# Patient Record
Sex: Male | Born: 1940 | Race: White | Hispanic: No | Marital: Married | Smoking: Former smoker
Health system: Southern US, Community
[De-identification: ages and names within clinical notes are randomized; demographics above are authoritative.]

## PROBLEM LIST (undated history)

## (undated) DIAGNOSIS — K219 Gastro-esophageal reflux disease without esophagitis: Secondary | ICD-10-CM

## (undated) DIAGNOSIS — I10 Essential (primary) hypertension: Secondary | ICD-10-CM

## (undated) DIAGNOSIS — I2510 Atherosclerotic heart disease of native coronary artery without angina pectoris: Secondary | ICD-10-CM

## (undated) DIAGNOSIS — E785 Hyperlipidemia, unspecified: Secondary | ICD-10-CM

## (undated) DIAGNOSIS — R9439 Abnormal result of other cardiovascular function study: Secondary | ICD-10-CM

## (undated) DIAGNOSIS — C649 Malignant neoplasm of unspecified kidney, except renal pelvis: Secondary | ICD-10-CM

## (undated) HISTORY — PX: TONSILLECTOMY: SUR1361

## (undated) HISTORY — DX: Essential (primary) hypertension: I10

## (undated) HISTORY — PX: KNEE SURGERY: SHX244

## (undated) HISTORY — PX: CORONARY ANGIOPLASTY WITH STENT PLACEMENT: SHX49

## (undated) HISTORY — DX: Atherosclerotic heart disease of native coronary artery without angina pectoris: I25.10

## (undated) HISTORY — PX: LUMBAR LAMINECTOMY: SHX95

## (undated) HISTORY — PX: APPENDECTOMY: SHX54

## (undated) HISTORY — DX: Malignant neoplasm of unspecified kidney, except renal pelvis: C64.9

## (undated) HISTORY — PX: CERVICAL LAMINECTOMY: SHX94

## (undated) HISTORY — DX: Hyperlipidemia, unspecified: E78.5

## (undated) HISTORY — DX: Gastro-esophageal reflux disease without esophagitis: K21.9

---

## 1998-02-21 ENCOUNTER — Other Ambulatory Visit: Admission: RE | Admit: 1998-02-21 | Discharge: 1998-02-21 | Payer: Self-pay | Admitting: Cardiology

## 1998-02-22 ENCOUNTER — Inpatient Hospital Stay (HOSPITAL_COMMUNITY): Admission: AD | Admit: 1998-02-22 | Discharge: 1998-02-24 | Payer: Self-pay | Admitting: Cardiology

## 1998-03-07 ENCOUNTER — Inpatient Hospital Stay (HOSPITAL_COMMUNITY): Admission: EM | Admit: 1998-03-07 | Discharge: 1998-03-09 | Payer: Self-pay | Admitting: *Deleted

## 1998-09-13 ENCOUNTER — Ambulatory Visit (HOSPITAL_COMMUNITY): Admission: RE | Admit: 1998-09-13 | Discharge: 1998-09-14 | Payer: Self-pay | Admitting: Cardiology

## 1998-09-30 HISTORY — PX: CORONARY ARTERY BYPASS GRAFT: SHX141

## 1999-01-07 ENCOUNTER — Inpatient Hospital Stay (HOSPITAL_COMMUNITY): Admission: EM | Admit: 1999-01-07 | Discharge: 1999-01-16 | Payer: Self-pay | Admitting: Emergency Medicine

## 1999-01-07 ENCOUNTER — Encounter: Payer: Self-pay | Admitting: Emergency Medicine

## 1999-01-10 ENCOUNTER — Encounter: Payer: Self-pay | Admitting: Internal Medicine

## 1999-01-11 ENCOUNTER — Encounter: Payer: Self-pay | Admitting: Thoracic Surgery (Cardiothoracic Vascular Surgery)

## 1999-01-12 ENCOUNTER — Encounter: Payer: Self-pay | Admitting: Thoracic Surgery (Cardiothoracic Vascular Surgery)

## 1999-01-13 ENCOUNTER — Encounter: Payer: Self-pay | Admitting: Cardiology

## 1999-01-14 ENCOUNTER — Encounter: Payer: Self-pay | Admitting: Thoracic Surgery (Cardiothoracic Vascular Surgery)

## 1999-02-06 ENCOUNTER — Encounter (HOSPITAL_COMMUNITY): Admission: RE | Admit: 1999-02-06 | Discharge: 1999-05-07 | Payer: Self-pay | Admitting: Cardiology

## 1999-05-08 ENCOUNTER — Encounter (HOSPITAL_COMMUNITY): Admission: RE | Admit: 1999-05-08 | Discharge: 1999-08-06 | Payer: Self-pay | Admitting: Cardiology

## 1999-12-19 ENCOUNTER — Inpatient Hospital Stay (HOSPITAL_COMMUNITY): Admission: EM | Admit: 1999-12-19 | Discharge: 1999-12-20 | Payer: Self-pay | Admitting: Emergency Medicine

## 2000-01-15 ENCOUNTER — Ambulatory Visit: Admission: RE | Admit: 2000-01-15 | Discharge: 2000-01-15 | Payer: Self-pay | Admitting: Cardiology

## 2001-12-01 ENCOUNTER — Encounter: Admission: RE | Admit: 2001-12-01 | Discharge: 2001-12-01 | Payer: Self-pay | Admitting: Neurosurgery

## 2001-12-01 ENCOUNTER — Encounter: Payer: Self-pay | Admitting: Neurosurgery

## 2001-12-15 ENCOUNTER — Encounter: Admission: RE | Admit: 2001-12-15 | Discharge: 2001-12-15 | Payer: Self-pay | Admitting: Neurosurgery

## 2001-12-15 ENCOUNTER — Encounter: Payer: Self-pay | Admitting: Neurosurgery

## 2002-07-19 ENCOUNTER — Inpatient Hospital Stay (HOSPITAL_COMMUNITY): Admission: EM | Admit: 2002-07-19 | Discharge: 2002-07-21 | Payer: Self-pay | Admitting: Emergency Medicine

## 2002-07-20 ENCOUNTER — Encounter: Payer: Self-pay | Admitting: Cardiology

## 2002-08-04 ENCOUNTER — Ambulatory Visit (HOSPITAL_COMMUNITY): Admission: RE | Admit: 2002-08-04 | Discharge: 2002-08-04 | Payer: Self-pay | Admitting: Internal Medicine

## 2002-09-30 DIAGNOSIS — C649 Malignant neoplasm of unspecified kidney, except renal pelvis: Secondary | ICD-10-CM

## 2002-09-30 HISTORY — PX: NEPHRECTOMY: SHX65

## 2002-09-30 HISTORY — DX: Malignant neoplasm of unspecified kidney, except renal pelvis: C64.9

## 2003-05-26 ENCOUNTER — Encounter: Payer: Self-pay | Admitting: Diagnostic Radiology

## 2003-05-26 ENCOUNTER — Encounter: Payer: Self-pay | Admitting: Orthopedic Surgery

## 2003-05-26 ENCOUNTER — Encounter: Admission: RE | Admit: 2003-05-26 | Discharge: 2003-05-26 | Payer: Self-pay | Admitting: Orthopedic Surgery

## 2003-06-13 ENCOUNTER — Encounter: Payer: Self-pay | Admitting: Orthopedic Surgery

## 2003-06-13 ENCOUNTER — Encounter: Admission: RE | Admit: 2003-06-13 | Discharge: 2003-06-13 | Payer: Self-pay | Admitting: Orthopedic Surgery

## 2003-06-27 ENCOUNTER — Encounter: Admission: RE | Admit: 2003-06-27 | Discharge: 2003-06-27 | Payer: Self-pay | Admitting: Orthopedic Surgery

## 2003-06-27 ENCOUNTER — Encounter: Payer: Self-pay | Admitting: Orthopedic Surgery

## 2003-08-12 ENCOUNTER — Ambulatory Visit (HOSPITAL_COMMUNITY): Admission: RE | Admit: 2003-08-12 | Discharge: 2003-08-12 | Payer: Self-pay | Admitting: Neurosurgery

## 2003-08-18 ENCOUNTER — Encounter: Admission: RE | Admit: 2003-08-18 | Discharge: 2003-08-18 | Payer: Self-pay | Admitting: Neurosurgery

## 2003-09-06 ENCOUNTER — Encounter (INDEPENDENT_AMBULATORY_CARE_PROVIDER_SITE_OTHER): Payer: Self-pay

## 2003-09-06 ENCOUNTER — Inpatient Hospital Stay (HOSPITAL_COMMUNITY): Admission: RE | Admit: 2003-09-06 | Discharge: 2003-09-09 | Payer: Self-pay | Admitting: Urology

## 2003-10-12 ENCOUNTER — Ambulatory Visit (HOSPITAL_COMMUNITY): Admission: RE | Admit: 2003-10-12 | Discharge: 2003-10-13 | Payer: Self-pay | Admitting: Neurosurgery

## 2003-12-16 ENCOUNTER — Ambulatory Visit (HOSPITAL_COMMUNITY): Admission: RE | Admit: 2003-12-16 | Discharge: 2003-12-16 | Payer: Self-pay | Admitting: Urology

## 2004-09-04 ENCOUNTER — Ambulatory Visit: Payer: Self-pay | Admitting: Cardiology

## 2004-09-11 ENCOUNTER — Ambulatory Visit: Payer: Self-pay | Admitting: Cardiology

## 2004-09-30 HISTORY — PX: OTHER SURGICAL HISTORY: SHX169

## 2005-09-20 ENCOUNTER — Ambulatory Visit: Payer: Self-pay | Admitting: Cardiology

## 2005-10-03 ENCOUNTER — Ambulatory Visit: Payer: Self-pay | Admitting: Cardiology

## 2006-01-01 ENCOUNTER — Ambulatory Visit: Payer: Self-pay | Admitting: Internal Medicine

## 2006-01-16 ENCOUNTER — Ambulatory Visit: Payer: Self-pay | Admitting: Internal Medicine

## 2006-01-16 ENCOUNTER — Encounter (INDEPENDENT_AMBULATORY_CARE_PROVIDER_SITE_OTHER): Payer: Self-pay | Admitting: *Deleted

## 2006-05-26 ENCOUNTER — Ambulatory Visit (HOSPITAL_COMMUNITY): Admission: RE | Admit: 2006-05-26 | Discharge: 2006-05-26 | Payer: Self-pay | Admitting: Urology

## 2006-10-02 ENCOUNTER — Ambulatory Visit: Payer: Self-pay | Admitting: Cardiology

## 2006-10-13 ENCOUNTER — Ambulatory Visit: Payer: Self-pay | Admitting: Cardiology

## 2006-10-13 LAB — CONVERTED CEMR LAB: TSH: 1.71 microintl units/mL (ref 0.35–5.50)

## 2006-10-20 ENCOUNTER — Ambulatory Visit: Payer: Self-pay

## 2006-11-06 ENCOUNTER — Ambulatory Visit (HOSPITAL_COMMUNITY): Admission: RE | Admit: 2006-11-06 | Discharge: 2006-11-06 | Payer: Self-pay | Admitting: Urology

## 2007-02-24 ENCOUNTER — Ambulatory Visit: Payer: Self-pay | Admitting: Internal Medicine

## 2007-05-12 ENCOUNTER — Ambulatory Visit (HOSPITAL_COMMUNITY): Admission: RE | Admit: 2007-05-12 | Discharge: 2007-05-12 | Payer: Self-pay | Admitting: Urology

## 2007-07-28 ENCOUNTER — Encounter: Admission: RE | Admit: 2007-07-28 | Discharge: 2007-07-28 | Payer: Self-pay | Admitting: Orthopedic Surgery

## 2007-08-11 ENCOUNTER — Encounter: Admission: RE | Admit: 2007-08-11 | Discharge: 2007-08-11 | Payer: Self-pay | Admitting: Orthopedic Surgery

## 2007-08-26 ENCOUNTER — Encounter: Admission: RE | Admit: 2007-08-26 | Discharge: 2007-08-26 | Payer: Self-pay | Admitting: Orthopedic Surgery

## 2007-09-01 ENCOUNTER — Encounter: Payer: Self-pay | Admitting: Internal Medicine

## 2007-10-13 ENCOUNTER — Ambulatory Visit: Payer: Self-pay | Admitting: Cardiology

## 2007-10-13 LAB — CONVERTED CEMR LAB
ALT: 24 units/L (ref 0–53)
AST: 21 units/L (ref 0–37)
Albumin: 3.6 g/dL (ref 3.5–5.2)
Alkaline Phosphatase: 66 units/L (ref 39–117)
BUN: 11 mg/dL (ref 6–23)
Basophils Absolute: 0 10*3/uL (ref 0.0–0.1)
Basophils Relative: 0.5 % (ref 0.0–1.0)
Bilirubin, Direct: 0.1 mg/dL (ref 0.0–0.3)
CO2: 31 meq/L (ref 19–32)
Calcium: 9.4 mg/dL (ref 8.4–10.5)
Chloride: 106 meq/L (ref 96–112)
Cholesterol: 166 mg/dL (ref 0–200)
Creatinine, Ser: 1.3 mg/dL (ref 0.4–1.5)
Eosinophils Absolute: 0.1 10*3/uL (ref 0.0–0.6)
Eosinophils Relative: 1 % (ref 0.0–5.0)
GFR calc Af Amer: 71 mL/min
GFR calc non Af Amer: 59 mL/min
Glucose, Bld: 108 mg/dL — ABNORMAL HIGH (ref 70–99)
HCT: 41.8 % (ref 39.0–52.0)
HDL: 49.2 mg/dL (ref >39.0)
Hemoglobin: 14.4 g/dL (ref 13.0–17.0)
LDL Cholesterol: 109 mg/dL — ABNORMAL HIGH (ref 0–99)
Lymphocytes Relative: 24.9 % (ref 12.0–46.0)
MCHC: 34.4 g/dL (ref 30.0–36.0)
MCV: 95.7 fL (ref 78.0–100.0)
Monocytes Absolute: 0.7 10*3/uL (ref 0.2–0.7)
Monocytes Relative: 9.3 % (ref 3.0–11.0)
Neutro Abs: 5.1 10*3/uL (ref 1.4–7.7)
Neutrophils Relative %: 64.3 % (ref 43.0–77.0)
Platelets: 273 10*3/uL (ref 150–400)
Potassium: 4.3 meq/L (ref 3.5–5.1)
RBC: 4.37 M/uL (ref 4.22–5.81)
RDW: 12.2 % (ref 11.5–14.6)
Sodium: 143 meq/L (ref 135–145)
TSH: 1.25 microintl units/mL (ref 0.35–5.50)
Total Bilirubin: 0.9 mg/dL (ref 0.3–1.2)
Total CHOL/HDL Ratio: 3.4
Total Protein: 6.9 g/dL (ref 6.0–8.3)
Triglycerides: 41 mg/dL (ref 0–149)
VLDL: 8 mg/dL (ref 0–40)
WBC: 7.8 10*3/uL (ref 4.5–10.5)

## 2007-10-16 ENCOUNTER — Ambulatory Visit: Payer: Self-pay | Admitting: Cardiology

## 2008-01-05 ENCOUNTER — Ambulatory Visit (HOSPITAL_COMMUNITY): Admission: RE | Admit: 2008-01-05 | Discharge: 2008-01-05 | Payer: Self-pay | Admitting: Urology

## 2008-10-04 ENCOUNTER — Ambulatory Visit: Payer: Self-pay | Admitting: Cardiology

## 2008-10-04 LAB — CONVERTED CEMR LAB
ALT: 23 units/L (ref 0–53)
AST: 22 units/L (ref 0–37)
Albumin: 3.6 g/dL (ref 3.5–5.2)
Alkaline Phosphatase: 62 units/L (ref 39–117)
BUN: 19 mg/dL (ref 6–23)
Basophils Absolute: 0 10*3/uL (ref 0.0–0.1)
Basophils Relative: 0.3 % (ref 0.0–3.0)
Bilirubin, Direct: 0.1 mg/dL (ref 0.0–0.3)
CO2: 31 meq/L (ref 19–32)
Calcium: 9.2 mg/dL (ref 8.4–10.5)
Chloride: 107 meq/L (ref 96–112)
Cholesterol: 148 mg/dL (ref 0–200)
Creatinine, Ser: 1.2 mg/dL (ref 0.4–1.5)
Eosinophils Absolute: 0.1 10*3/uL (ref 0.0–0.7)
Eosinophils Relative: 1.2 % (ref 0.0–5.0)
GFR calc Af Amer: 78 mL/min
GFR calc non Af Amer: 65 mL/min
Glucose, Bld: 119 mg/dL — ABNORMAL HIGH (ref 70–99)
HCT: 42.5 % (ref 39.0–52.0)
HDL: 42.4 mg/dL (ref >39.0)
Hemoglobin: 14.7 g/dL (ref 13.0–17.0)
LDL Cholesterol: 94 mg/dL (ref 0–99)
Lymphocytes Relative: 23.3 % (ref 12.0–46.0)
MCHC: 34.6 g/dL (ref 30.0–36.0)
MCV: 95.8 fL (ref 78.0–100.0)
Monocytes Absolute: 0.6 10*3/uL (ref 0.1–1.0)
Monocytes Relative: 8.7 % (ref 3.0–12.0)
Neutro Abs: 4.5 10*3/uL (ref 1.4–7.7)
Neutrophils Relative %: 66.5 % (ref 43.0–77.0)
Platelets: 166 10*3/uL (ref 150–400)
Potassium: 4.9 meq/L (ref 3.5–5.1)
RBC: 4.43 M/uL (ref 4.22–5.81)
RDW: 12.1 % (ref 11.5–14.6)
Sodium: 142 meq/L (ref 135–145)
Total Bilirubin: 0.7 mg/dL (ref 0.3–1.2)
Total CHOL/HDL Ratio: 3.5
Total Protein: 6.6 g/dL (ref 6.0–8.3)
Triglycerides: 57 mg/dL (ref 0–149)
VLDL: 11 mg/dL (ref 0–40)
WBC: 6.8 10*3/uL (ref 4.5–10.5)

## 2008-10-07 ENCOUNTER — Ambulatory Visit: Payer: Self-pay | Admitting: Cardiology

## 2008-10-07 LAB — CONVERTED CEMR LAB: Hgb A1c MFr Bld: 6 % (ref 4.6–6.0)

## 2009-02-02 ENCOUNTER — Ambulatory Visit (HOSPITAL_COMMUNITY): Admission: RE | Admit: 2009-02-02 | Discharge: 2009-02-02 | Payer: Self-pay | Admitting: Urology

## 2009-07-06 ENCOUNTER — Encounter: Payer: Self-pay | Admitting: Internal Medicine

## 2009-08-22 ENCOUNTER — Telehealth: Payer: Self-pay | Admitting: Cardiology

## 2009-10-11 ENCOUNTER — Ambulatory Visit: Payer: Self-pay | Admitting: Internal Medicine

## 2009-10-11 DIAGNOSIS — I10 Essential (primary) hypertension: Secondary | ICD-10-CM

## 2009-10-11 DIAGNOSIS — K219 Gastro-esophageal reflux disease without esophagitis: Secondary | ICD-10-CM

## 2009-10-11 DIAGNOSIS — I2510 Atherosclerotic heart disease of native coronary artery without angina pectoris: Secondary | ICD-10-CM | POA: Insufficient documentation

## 2009-10-11 DIAGNOSIS — C649 Malignant neoplasm of unspecified kidney, except renal pelvis: Secondary | ICD-10-CM | POA: Insufficient documentation

## 2009-10-11 DIAGNOSIS — R519 Headache, unspecified: Secondary | ICD-10-CM | POA: Insufficient documentation

## 2009-10-11 DIAGNOSIS — E785 Hyperlipidemia, unspecified: Secondary | ICD-10-CM

## 2009-10-11 DIAGNOSIS — R51 Headache: Secondary | ICD-10-CM

## 2009-10-16 ENCOUNTER — Ambulatory Visit: Payer: Self-pay | Admitting: Cardiology

## 2009-10-25 ENCOUNTER — Ambulatory Visit: Payer: Self-pay | Admitting: Cardiology

## 2009-10-25 ENCOUNTER — Encounter: Payer: Self-pay | Admitting: Cardiology

## 2009-10-26 LAB — CONVERTED CEMR LAB
ALT: 23 units/L (ref 0–53)
AST: 22 units/L (ref 0–37)
Albumin: 3.9 g/dL (ref 3.5–5.2)
Alkaline Phosphatase: 65 units/L (ref 39–117)
Basophils Absolute: 0.1 10*3/uL (ref 0.0–0.1)
Basophils Relative: 0.9 % (ref 0.0–3.0)
Bilirubin, Direct: 0.1 mg/dL (ref 0.0–0.3)
Cholesterol: 164 mg/dL (ref 0–200)
Eosinophils Absolute: 0.1 10*3/uL (ref 0.0–0.7)
Eosinophils Relative: 1.2 % (ref 0.0–5.0)
HCT: 43.2 % (ref 39.0–52.0)
HDL: 47.9 mg/dL (ref >39.00)
Hemoglobin: 14.3 g/dL (ref 13.0–17.0)
Hgb A1c MFr Bld: 6.1 % (ref 4.6–6.5)
LDL Cholesterol: 101 mg/dL — ABNORMAL HIGH (ref 0–99)
Lymphocytes Relative: 26.5 % (ref 12.0–46.0)
Lymphs Abs: 2 10*3/uL (ref 0.7–4.0)
MCHC: 33.1 g/dL (ref 30.0–36.0)
MCV: 97.2 fL (ref 78.0–100.0)
Monocytes Absolute: 0.7 10*3/uL (ref 0.1–1.0)
Monocytes Relative: 9 % (ref 3.0–12.0)
Neutro Abs: 4.7 10*3/uL (ref 1.4–7.7)
Neutrophils Relative %: 62.4 % (ref 43.0–77.0)
Platelets: 196 10*3/uL (ref 150.0–400.0)
RBC: 4.44 M/uL (ref 4.22–5.81)
RDW: 12.4 % (ref 11.5–14.6)
TSH: 1.32 microintl units/mL (ref 0.35–5.50)
Total Bilirubin: 0.6 mg/dL (ref 0.3–1.2)
Total CHOL/HDL Ratio: 3
Total Protein: 7.3 g/dL (ref 6.0–8.3)
Triglycerides: 75 mg/dL (ref 0.0–149.0)
VLDL: 15 mg/dL (ref 0.0–40.0)
WBC: 7.6 10*3/uL (ref 4.5–10.5)

## 2009-10-27 ENCOUNTER — Encounter: Payer: Self-pay | Admitting: Cardiology

## 2010-03-12 ENCOUNTER — Telehealth (INDEPENDENT_AMBULATORY_CARE_PROVIDER_SITE_OTHER): Payer: Self-pay | Admitting: *Deleted

## 2010-06-24 ENCOUNTER — Encounter: Payer: Self-pay | Admitting: Internal Medicine

## 2010-07-11 ENCOUNTER — Telehealth: Payer: Self-pay | Admitting: Cardiology

## 2010-10-02 ENCOUNTER — Ambulatory Visit
Admission: RE | Admit: 2010-10-02 | Discharge: 2010-10-02 | Payer: Self-pay | Source: Home / Self Care | Attending: Cardiovascular Disease | Admitting: Cardiovascular Disease

## 2010-10-03 ENCOUNTER — Encounter (INDEPENDENT_AMBULATORY_CARE_PROVIDER_SITE_OTHER): Payer: Self-pay | Admitting: *Deleted

## 2010-10-03 LAB — CONVERTED CEMR LAB
ALT: 19 units/L (ref 0–53)
AST: 20 units/L (ref 0–37)
Albumin: 3.5 g/dL (ref 3.5–5.2)
Alkaline Phosphatase: 70 units/L (ref 39–117)
BUN: 16 mg/dL (ref 6–23)
Basophils Absolute: 0 10*3/uL (ref 0.0–0.1)
Basophils Relative: 0.4 % (ref 0.0–3.0)
Bilirubin, Direct: 0.1 mg/dL (ref 0.0–0.3)
CO2: 28 meq/L (ref 19–32)
Calcium: 9.3 mg/dL (ref 8.4–10.5)
Chloride: 106 meq/L (ref 96–112)
Cholesterol: 154 mg/dL (ref 0–200)
Creatinine, Ser: 1.1 mg/dL (ref 0.4–1.5)
Eosinophils Absolute: 0.1 10*3/uL (ref 0.0–0.7)
Eosinophils Relative: 0.7 % (ref 0.0–5.0)
GFR calc non Af Amer: 71.52 mL/min (ref >60.00)
Glucose, Bld: 98 mg/dL (ref 70–99)
HCT: 41.4 % (ref 39.0–52.0)
HDL: 43.3 mg/dL (ref >39.00)
Hemoglobin: 14.1 g/dL (ref 13.0–17.0)
LDL Cholesterol: 101 mg/dL — ABNORMAL HIGH (ref 0–99)
Lymphocytes Relative: 21.2 % (ref 12.0–46.0)
Lymphs Abs: 1.9 10*3/uL (ref 0.7–4.0)
MCHC: 34.1 g/dL (ref 30.0–36.0)
MCV: 95.2 fL (ref 78.0–100.0)
Monocytes Absolute: 0.7 10*3/uL (ref 0.1–1.0)
Monocytes Relative: 7.6 % (ref 3.0–12.0)
Neutro Abs: 6.3 10*3/uL (ref 1.4–7.7)
Neutrophils Relative %: 70.1 % (ref 43.0–77.0)
Platelets: 207 10*3/uL (ref 150.0–400.0)
Potassium: 4.4 meq/L (ref 3.5–5.1)
RBC: 4.35 M/uL (ref 4.22–5.81)
RDW: 13.2 % (ref 11.5–14.6)
Sodium: 142 meq/L (ref 135–145)
TSH: 1.54 microintl units/mL (ref 0.35–5.50)
Total Bilirubin: 0.7 mg/dL (ref 0.3–1.2)
Total CHOL/HDL Ratio: 4
Total Protein: 7 g/dL (ref 6.0–8.3)
Triglycerides: 51 mg/dL (ref 0.0–149.0)
VLDL: 10.2 mg/dL (ref 0.0–40.0)
WBC: 9 10*3/uL (ref 4.5–10.5)

## 2010-10-09 ENCOUNTER — Ambulatory Visit
Admission: RE | Admit: 2010-10-09 | Discharge: 2010-10-09 | Payer: Self-pay | Source: Home / Self Care | Attending: Cardiovascular Disease | Admitting: Cardiovascular Disease

## 2010-11-01 NOTE — Medication Information (Signed)
Summary: Prescription Refill  Prescription Refill   Imported By: Roderic Ovens 11/21/2009 12:17:38  _____________________________________________________________________  External Attachment:    Type:   Image     Comment:   External Document

## 2010-11-01 NOTE — Medication Information (Signed)
Summary: [facility] Prescription Order Form  [facility] Prescription Order Form   Imported By: Roderic Ovens 11/24/2009 09:20:17  _____________________________________________________________________  External Attachment:    Type:   Image     Comment:   External Document

## 2010-11-01 NOTE — Miscellaneous (Signed)
Summary: [facility]  [facility]   Imported By: Lanelle Bal 07/02/2010 12:26:08  _____________________________________________________________________  External Attachment:    Type:   Image     Comment:   External Document

## 2010-11-01 NOTE — Letter (Signed)
Summary: Custom - Lipid  [name], Main Office  [address]   [place], [place] [address]   Phone: [phone]  Fax: [phone]     October 03, 2010 MRN: [id number]   Curtis Galloway [address] [place], [place]  [address]   Dear [name],  We have reviewed your cholesterol results.  They are as follows:     Total Cholesterol:    154 (Desirable: less than 200)       HDL  Cholesterol:     43.30  (Desirable: greater than 40 for men and 50 for women)       LDL Cholesterol:       101  (Desirable: less than 100 for low risk and less than 70 for moderate to high risk)       Triglycerides:       51.0  (Desirable: less than 150)  Our recommendations include:These numbers look good. Continue on the same medicine. Sodium, potassium, blood count, kidney, thyroid and Liver function are normal. Take care, Dr. Leanora Cover.`    Call our office at the number listed above if you have any questions.  Lowering your LDL cholesterol is important, but it is only one of a large number of "risk factors" that may indicate that you are at risk for heart disease, stroke or other complications of hardening of the arteries.  Other risk factors include:   A.  Cigarette Smoking* B.  High Blood Pressure* C.  Obesity* D.   Low HDL Cholesterol (see yours above)* E.   Diabetes Mellitus (higher risk if your is uncontrolled) F.  Family history of premature heart disease G.  Previous history of stroke or cardiovascular disease    *These are risk factors YOU HAVE CONTROL OVER.  For more information, visit .  There is now evidence that lowering the TOTAL CHOLESTEROL AND LDL CHOLESTEROL can reduce the risk of heart disease.  The American Heart Association recommends the following guidelines for the treatment of elevated cholesterol:  1.  If there is now current heart disease and less than two risk factors, TOTAL CHOLESTEROL should be less than 200 and LDL CHOLESTEROL should be less  than 100. 2.  If there is current heart disease or two or more risk factors, TOTAL CHOLESTEROL should be less than 200 and LDL CHOLESTEROL should be less than 70.  A diet low in cholesterol, saturated fat, and calories is the cornerstone of treatment for elevated cholesterol.  Cessation of smoking and exercise are also important in the management of elevated cholesterol and preventing vascular disease.  Studies have shown that 30 to 60 minutes of physical activity most days can help lower blood pressure, lower cholesterol, and keep your weight at a healthy level.  Drug therapy is used when cholesterol levels do not respond to therapeutic lifestyle changes (smoking cessation, diet, and exercise) and remains unacceptably high.  If medication is started, it is important to have you levels checked periodically to evaluate the need for further treatment options.  Thank you,    [facility] Team

## 2010-11-01 NOTE — Assessment & Plan Note (Signed)
Summary: re-est as a new patient - jr   Vital Signs:  Patient profile:   70 year old male Height:      70 inches (177.80 cm) Weight:      173.50 pounds (78.86 kg) BMI:     24.98 Resp:     15 per minute BP sitting:   110 / 70  Vitals Entered By: Kandice Hams (October 11, 2009 1:08 PM) CC: to reestablish c/o headaches, bump on penis, Headaches   CC:  to reestablish c/o headaches, bump on penis, and Headaches.  History of Present Illness: Curtis Galloway is reestablshing  as new patient for insurance purposes. He sees Dr Juanda Chance &  Dr Darvin Neighbours annually.Dr Regino Schultze 10/07/2009 OV reviewed. He has been having headaches X 2 weeks , initially over L crown, now mid frontal area. The patient reports nausea, tearing of eyes, and nasal congestion, but denies vomiting, sweats, sinus pain, sinus pressure, photophobia, and phonophobia.  The headache is described as intermittent and throbbing.  High-risk features (red flags) include new type of headache and age >50 years.  The patient denies the following high-risk features: fever, neck pain/stiffness, vision loss or change, focal weakness, altered mental status, rash, trauma, pain worse with exertion, immunosuppression, and anticoagulation use.  The headaches are precipitated by change in weather.  Prior treatment has included acetaminophen & rest with some benefit. No PMH of migraines; no prodrome or aura.    Preventive Screening-Counseling & Management  Alcohol-Tobacco     Smoking Status: quit  Caffeine-Diet-Exercise     Does Patient Exercise: no  Allergies (verified): 1)  ! 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 Eyes:  Denies blurring, double vision, and vision loss-both eyes. ENT:  Denies decreased hearing, difficulty swallowing, hoarseness, and ringing in ears; No facial pain or purulence. GI:  Denies abdominal pain, bloody stools, dark tarry stools, and indigestion; No dysphagia. Derm:  Denies lesion(s) and rash. Neuro:  Denies brief paralysis, numbness, and tingling. Allergy:  Complains of itching eyes; denies sneezing.  Physical Exam  General:  well-nourished,in no acute distress; alert,appropriate and cooperative throughout examination Head:  Normocephalic and atraumatic without obvious abnormalities. Pattern  alopecia  Eyes:  No corneal or conjunctival inflammation noted. EOMI. Perrla. Funduscopic exam benign, without hemorrhages, exudates or papilledema. Field of Vision grossly normal. Ears:  External ear exam shows no significant lesions or deformities.  Otoscopic examination reveals clear canals, tympanic membranes are intact bilaterally without bulging, retraction, inflammation or discharge. Hearing is grossly normal bilaterally. Nose:  External nasal examination shows no deformity or inflammation. Nasal mucosa are pink and moist without lesions or exudates. Mouth:  Oral mucosa and oropharynx without lesions or exudates.  Dentures Neck:  No deformities, masses, or tenderness noted. Lungs:  Normal respiratory effort, chest expands symmetrically. Lungs are clear to auscultation, no crackles or wheezes but decreased BS Heart:  Normal rate and regular rhythm. S1 and S2 normal without gallop, murmur, click, rub . S4 Abdomen:  Bowel sounds  positive,abdomen soft and non-tender without masses, organomegaly or hernias  noted. Genitalia:  Dr Earlene Plater  Prostate:  Dr Earlene Plater Msk:  No deformity or scoliosis noted of thoracic or lumbar spine.   Pulses:  R and L carotid,radial,dorsalis pedis and posterior tibial pulses are full and equal bilaterally Extremities:  No clubbing, cyanosis, edema, or deformity noted with normal full range of motion of all joints.   Neurologic:  alert & oriented X3, cranial nerves II-XII intact, strength normal in all extremities, sensation intact to light touch, and DTRs  asymmetrical , decreased LLE  Skin:  Weathered facies Cervical Nodes:  No lymphadenopathy noted Axillary Nodes:  No palpable lymphadenopathy Psych:  memory intact for recent and remote, normally interactive, and good eye contact.     Impression & Recommendations:  Problem # 1:  HEADACHE (ICD-784.0) Negative Neuro exam His updated medication list for this problem includes:    Toprol Xl 50 Mg Xr24h-tab (Metoprolol succinate) .Marland Kitchen... 1 by mouth once daily    Tramadol Hcl 50 Mg Tabs (Tramadol hcl) .Marland Kitchen... 1 q 6 hrs as needed headache  Problem # 2:  HYPERTENSION (ICD-401.9)  His updated medication list for this problem includes:    Toprol Xl 50 Mg Xr24h-tab (Metoprolol succinate) .Marland Kitchen... 1 by mouth once daily  Problem # 3:  HYPERLIPIDEMIA (ICD-272.4)  His updated medication list for this problem includes:    Lipitor 80 Mg Tabs (Atorvastatin calcium) .Marland Kitchen... 1 by mouth once daily  Problem # 4:  CORONARY ARTERY DISEASE (ICD-414.00) as per Dr Juanda Chance His updated medication list for this problem includes:    Toprol Xl 50 Mg Xr24h-tab (Metoprolol succinate) .Marland Kitchen... 1 by mouth once daily  Problem # 5:  GERD (ICD-530.81) Stable His updated medication list for this problem includes:    Prilosec 20 Mg Cpdr (Omeprazole) .Marland Kitchen... 1 once daily as needed  Problem # 6:  HYPERNEPHROMA (ICD-189.0) as per Dr Earlene Plater  Complete Medication List: 1)  Lipitor 80 Mg Tabs (Atorvastatin calcium) .Marland Kitchen.. 1 by mouth once daily 2)  Asa 81 Mg  .Marland KitchenMarland Kitchen. 1  by mouth once daily 3)  Mvt  .Marland KitchenMarland Kitchen. 1 by mouth once daily 4)  Toprol Xl 50 Mg Xr24h-tab (Metoprolol succinate) .Marland Kitchen.. 1 by mouth once daily 5)  Omega 3 Fish Oil 2000mg   .... 1 by mouth once daily 6)  Prilosec 20 Mg Cpdr (Omeprazole) .Marland Kitchen.. 1 once daily as needed 7)  Tramadol Hcl 50 Mg Tabs (Tramadol hcl) .Marland Kitchen.. 1 q 6 hrs as needed headache  Patient Instructions: 1)  Keep Headache Diary as discussed. Astepro sample  2 sprays once daily as needed as directed. Add A1c & TSH to labs(272.4, 790.29) Prescriptions: TRAMADOL HCL 50 MG TABS (TRAMADOL HCL) 1 q 6 hrs as needed headache  #20 x 0   Entered and Authorized by:   Marga Melnick MD   Signed by:   Marga Melnick MD on 10/11/2009   Method used:   Faxed to ...       [facility]       [address]       [place], [place]  [address]       Ph: [phone]       Fax: [phone]   RxID:   [id number]   Appended Document: re-est as a new patient - jr White rash over glans penis. Rx: Neosporin. Nizoral 30 grams. Apply once daily & blow dry.

## 2010-11-01 NOTE — Assessment & Plan Note (Signed)
Summary: A5W   Primary Provider:  Marga Melnick MD  CC:  fatigue.  History of Present Illness: This is a 70 year old white male patient, who has history of CABG in 2000 and underwent cardiac catheter in 2003, which showed his LIMA to the LAD was occluded, but he had nonobstructive disease in the LAD. His SVG to the diagonal and RCA were patent. He had normal LV function.  He is here today for one-year followup. He denies chest pain, palpitations, dyspnea, dyspnea on exertion, dizziness, or presyncope. 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 He admits to going off his diet over the holidays and gaining a few pounds.  He needs refills on his meds and we will switch him to generic lipitor  Current Problems (verified): 1)  Hypernephroma  (ICD-189.0) 2)  Headache  (ICD-784.0) 3)  Hypertension  (ICD-401.9) 4)  Hyperlipidemia  (ICD-272.4) 5)  Gerd  (ICD-530.81) 6)  Coronary Artery Disease  (ICD-414.00)  Current Medications (verified): 1)  Lipitor 80 Mg Tabs (Atorvastatin Calcium) .Marland Kitchen.. 1 By Mouth Once Daily 2)  Asa 81 Mg .Marland Kitchen.. 1 By Mouth Once Daily 3)  Mvt .Marland Kitchen.. 1 By Mouth Once Daily 4)  Toprol Xl 50 Mg Xr24h-Tab (Metoprolol Succinate) .Marland Kitchen.. 1 By Mouth Once Daily 5)  Omega 3 Fish Oil 2000mg  .... 1 By Mouth Once Daily 6)  Prilosec 20 Mg Cpdr (Omeprazole) .Marland Kitchen.. 1 Once Daily As Needed  Allergies (verified): 1)  ! Codeine 2)  ! 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 ; DDD , Dr Jeral Fruit Coronary artery disease GERD Hyperlipidemia Hypertension  Past Surgical History: Last updated: 10/11/2009 Appendectomy Coronary artery bypass graft 2000, Dr Charlies Constable Lumbar laminectomy X 4 Nephrectomy 2004 Tonsillectomy Cervical laminectomy X 1 Colonoscopy 2006, Dr Lina Sar  Family History: Last updated: 10/11/2009 Father: MI @ 76 Mother: DM,CAD Siblings: CAD  Social History: Last updated:  10/11/2009 Retired Married Former Smoker: 2004 Alcohol use-no Regular exercise-no  Review of Systems       Denies fever, malais, weight loss, blurry vision, decreased visual acuity, cough, sputum, SOB, hemoptysis, pleuritic pain, palpitaitons, heartburn, abdominal pain, melena, lower extremity edema, claudication, or rash.   Vital Signs:  Patient profile:   70 year old male Height:      70 inches Weight:      187 pounds BMI:     26.93 Pulse rate:   80 / minute Resp:     14 per minute BP sitting:   130 / 82  (left arm)  Vitals Entered By: Kem Parkinson (October 09, 2010 9:16 AM)  Physical Exam  General:  Affect appropriate Healthy:  appears stated age HEENT: normal Neck supple with no adenopathy JVP normal no bruits no thyromegaly Lungs clear with no wheezing and good diaphragmatic motion Heart:  S1/S2 no murmur,rub, gallop or click PMI normal Abdomen: benighn, BS positve, no tenderness, no AAA no bruit.  No HSM or HJR Distal pulses intact with no bruits No edema Neuro non-focal Skin warm and dry    Impression & Recommendations:  Problem # 1:  HYPERTENSION (ICD-401.9) Well contolled His updated medication list for this problem includes:    Toprol Xl 50 Mg Xr24h-tab (Metoprolol succinate) .Marland Kitchen... 1 by mouth once daily  Problem # 2:  HYPERLIPIDEMIA (ICD-272.4) At goal with no side effects His updated medication list for this problem includes:    Lipitor 80 Mg Tabs (Atorvastatin calcium) .Marland Kitchen... 1 by mouth  once daily  CHOL: 154 (10/02/2010)   LDL: 101 (10/02/2010)   HDL: 43.30 (10/02/2010)   TG: 51.0 (10/02/2010)  Problem # 3:  CORONARY ARTERY DISEASE (ICD-414.00) Stable no angina His updated medication list for this problem includes:    Toprol Xl 50 Mg Xr24h-tab (Metoprolol succinate) .Marland Kitchen... 1 by mouth once daily  Patient Instructions: 1)  Your physician wants you to follow-up in:ONE YEAR  You will receive a reminder letter in the mail two months in advance.  If you don't receive a letter, please call our office to schedule the follow-up appointment. Prescriptions: TOPROL XL 50 MG XR24H-TAB (METOPROLOL SUCCINATE) 1 by mouth once daily  #90 x 3   Entered by:   Deliah Goody, RN   Authorized by:   Colon Branch, MD, [facility]   Signed by:   Deliah Goody, RN on 10/09/2010   Method used:   Faxed to ...       [facility] (mail-order)       [address]       [place], [place]  [id number]       Ph: [phone]       Fax: [phone]   RxID:   [id number] LIPITOR 80 MG TABS (ATORVASTATIN CALCIUM) 1 by mouth once daily  #90 x 3   Entered by:   Deliah Goody, RN   Authorized by:   Colon Branch, MD, [facility]   Signed by:   Deliah Goody, RN on 10/09/2010   Method used:   Faxed to ...       [facility] (mail[facility])       [address]       [place], [place]  [id number]       Ph: [phone]       Fax: [phone]   RxID:   [id number]

## 2010-11-01 NOTE — Assessment & Plan Note (Signed)
Summary: PER CHECK OUT   Visit Type:  Follow-up Primary Provider:  Marga Melnick MD  CC:  no energy.  History of Present Illness: This is a 70 year old white male patient, who has history of CABG in 2000 and underwent cardiac catheter in 2003, which showed his LIMA to the LAD was occluded, but he had nonobstructive disease in the LAD. His SVG to the diagonal and RCA were patent. He had normal LV function.  He is here today for one-year followup. He denies chest pain, palpitations, dyspnea, dyspnea on exertion, dizziness, or presyncope. 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 He admits to going off his diet over the holidays and gaining a few pounds.  Current Medications (verified): 1)  Lipitor 80 Mg Tabs (Atorvastatin Calcium) .Marland Kitchen.. 1 By Mouth Once Daily 2)  Asa 81 Mg .Marland Kitchen.. 1 By Mouth Once Daily 3)  Mvt .Marland Kitchen.. 1 By Mouth Once Daily 4)  Toprol Xl 50 Mg Xr24h-Tab (Metoprolol Succinate) .Marland Kitchen.. 1 By Mouth Once Daily 5)  Omega 3 Fish Oil 2000mg  .... 1 By Mouth Once Daily 6)  Prilosec 20 Mg Cpdr (Omeprazole) .Marland Kitchen.. 1 Once Daily As Needed  Allergies (verified): 1)  ! Codeine 2)  ! 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 Retired Married Former Smoker: 2004 Alcohol use-no Regular exercise-no  Review of Systems       see history of present illness, otherwise, negative  Vital Signs:  Patient profile:   70 year old male Height:      70 inches Weight:      176 pounds BMI:     25.34 Pulse rate:   77 / minute BP sitting:   130 / 69 Cuff size:   regular  Vitals Entered By: Burnett Kanaris, CNA (October 25, 2009 10:02 AM)  Physical Exam  General:   Well-nournished, in no acute distress. Neck: No JVD, HJR, Bruit, or thyroid enlargement Lungs: No  tachypnea, clear without wheezing, rales, or rhonchi Cardiovascular: RRR, PMI not displaced, heart sounds normal, no murmurs, gallops, bruit, thrill, or heave. Abdomen: BS normal. Soft without organomegaly, masses, lesions or tenderness. Extremities: without cyanosis, clubbing or edema. Good distal pulses bilateral SKin: Warm, no lesions or rashes  Musculoskeletal: No deformities Neuro: no focal signs    EKG  Procedure date:  10/25/2009  Findings:      normal sinus rhythm with nonspecific ST-T wave changes. No acute change  Impression & Recommendations:  Problem # 1:  CORONARY ARTERY DISEASE (ICD-414.00) Patient status post CABG in 2000, with last catheter in 2003 as documented in history of present illness. Patient has had no further chest pain and is doing well. We encourage daily exercise. His updated medication list for this problem includes:    Toprol Xl 50 Mg Xr24h-tab (Metoprolol succinate) .Marland Kitchen... 1 by mouth once daily  Problem # 2:  HYPERLIPIDEMIA (ICD-272.4) Patient had lipid profile cholesterol, was 164, triglycerides 75, HDL 47, LDL 101. Will not make any changes at this time. Low-fat diet His updated medication list for this problem includes:    Lipitor 80 Mg Tabs (Atorvastatin calcium) .Marland Kitchen... 1 by mouth once daily  Problem # 3:  HYPERTENSION (ICD-401.9) Blood pressure controlled His updated medication list for this problem includes:    Toprol Xl 50 Mg Xr24h-tab (Metoprolol succinate) .Marland KitchenMarland KitchenMarland KitchenMarland Kitchen 1  by mouth once daily  Patient Instructions: 1)  Your physician recommends a low cholesterol, low fat diet. Please see [facility] handout. 2)  Regular Exercise 3)  Your physician wants you to follow-up in: 1 year with Dr. Charlton Haws.  You will receive a reminder letter in the mail two months in advance. If you don't receive a letter, please call our office to schedule the follow-up appointment. Prescriptions: LIPITOR 80 MG TABS (ATORVASTATIN CALCIUM) 1 by mouth once daily  #90 x 3    Entered by:   Sherri Rad, RN, BSN   Authorized by:   Lenoria Farrier, MD, [facility]   Signed by:   Sherri Rad, RN, BSN on 10/25/2009   Method used:   Printed then faxed to ...         RxID:   [id number] TOPROL XL 50 MG XR24H-TAB (METOPROLOL SUCCINATE) 1 by mouth once daily  #90 x 3   Entered by:   Sherri Rad, RN, BSN   Authorized by:   Lenoria Farrier, MD, [facility]   Signed by:   Sherri Rad, RN, BSN on 10/25/2009   Method used:   Printed then faxed to ...         RxID:   [id number] TOPROL XL 50 MG XR24H-TAB (METOPROLOL SUCCINATE) 1 by mouth once daily  #30 x 0   Entered by:   Sherri Rad, RN, BSN   Authorized by:   Lenoria Farrier, MD, [facility]   Signed by:   Sherri Rad, RN, BSN on 10/25/2009   Method used:   Print then Give to Patient   RxID:   [id number]   Appended Document: PER CHECK OUT I saw Curtis Galloway with Wende Bushy today.  he has had no recent cardiac symptoms. He has had previous bypass surgery and followup catheterization and his coronary anatomy has been stable. I encouraged him about regular exercise, heart healthy diet, and getting his weight down.  We will plan to see him back in followup in a year and I will arrange that appointment with Dr. Eden Emms who knows Curtis Galloway and his family very well.

## 2010-11-01 NOTE — Progress Notes (Signed)
Summary: sinus congestion and cough   Phone Note Call from Patient Call back at Work Phone [phone]   Caller: Spouse- Burna Mortimer Summary of Call: patient wife called they are out of town patient is having low grade temp, sore throat and sinus congestion and would like to get something called into pharmacy. Informed patient wife we don't call in meds w/ out office visit and that he would need to be seen at nearest urgent care. Wife says he has been taking corcidin hbp to help also suggested he try some tylenol for fever and nasal saline rinse to help keep sinues clear. She says they have some afrin and will try that also informed only to use a needed b/c this med could make sinuse congestion worst if over used patient wife ok'd information .........Marland KitchenDoristine Devoid  March 12, 2010 4:48 PM

## 2010-11-01 NOTE — Progress Notes (Signed)
Summary: questions about labs   Phone Note Call from Patient Call back at Work Phone [phone]   Caller: Alexis Frock Summary of Call: Pt has question about having labs drawn Initial call taken by: Judie Grieve,  July 11, 2010 1:38 PM  Follow-up for Phone Call        I left a message to call. Sherri Rad, RN, BSN  July 11, 2010 3:17 PM   I spoke with the pt's wife. She wanted to r/s the pt's appt with Dr. Eden Emms to a little later in January so the pt can have labs prior to his appt in January. The pt will have labs on 10/02/10 and see Dr. Eden Emms on 10/09/10. Follow-up by: Sherri Rad, RN, BSN,  July 11, 2010 4:31 PM

## 2011-02-12 NOTE — Assessment & Plan Note (Signed)
[facility]                            CARDIOLOGY OFFICE NOTE   TOAN, MORT                       MRN:          [id number]  DATE:10/07/2008                            DOB:          1941-08-28    PRIMARY CARE PHYSICIAN:  Titus Dubin. Alwyn Ren, MD, FACP, [facility]   CLINICAL HISTORY:  Curtis Galloway is a 70 year old returned for management  of his coronary heart disease.  He had bypass surgery in 2000 and 2003.  His LIMA was occluded, but he had nonobstructive disease into the LAD.  His vein grafts to the diagonal into the right were patent.  He has good  LV function.   He has been doing well over the past year.  He has occasional chest  tightness which is not related to exertion and lasts about 10 minutes.  He has not taken nitroglycerin.  He has had no palpitations.   His past medical history is significant for hypertension,  hyperlipidemia, GERD.  He had been a previous smoker.  He also has had a  previous nephrectomy for hypernephroma.   CURRENT MEDICATIONS:  1. Toprol 50 mg daily.  2. Aspirin 81 mg daily.  3. Lipitor 80 mg daily.  4. Multivitamins.  5. Fish oil.   PHYSICAL EXAMINATION:  VITAL SIGNS:  The blood pressure was 130/80 and  the pulse 79 and regular.  There was no venous tension.  Carotid pulses  were full without bruits.  CHEST:  Clear.  CARDIAC:  Rhythm is regular.  No murmurs or gallops.  ABDOMEN:  Soft with normal bowel sounds.  EXTREMITIES:  Peripheral pulses are full and there is no peripheral  edema.   Electrocardiogram showed minor nonspecific ST-T change.   IMPRESSION:  Coronary artery disease status post coronary bypass graft  surgery 2000, with coronary anatomy as described above at  catheterization in 2003, and hypertension, hyperlipidemia, normal left  ventricular function, gastroesophageal reflux disease, history of left  nephrectomy for hypernephroma, elevated fasting sugar.   RECOMMENDATIONS:  Otherwise lab  indicated elevated fasting sugar and his  LDL is not quite at target.  I talked him about restricting the bad  carbohydrates.  We will get a hemoglobin A1c.  His hemoglobin A1c is  elevated and will consider referral to Dr. Alwyn Ren for further  management.     Bruce Elvera Lennox Juanda Chance, MD, [facility]  Electronically Signed    BRB/MedQ  DD: 10/07/2008  DT: 10/07/2008  Job #: [id number]

## 2011-02-12 NOTE — Assessment & Plan Note (Signed)
[facility]                            CARDIOLOGY OFFICE NOTE   Curtis Galloway, Curtis Galloway                       MRN:          [id number]  DATE:10/16/2007                            DOB:          August 24, 1941    PRIMARY CARE PHYSICIAN:  Titus Dubin. Alwyn Ren, MD.   CLINICAL HISTORY:  Curtis Galloway is 70 years old and returns for  management of his coronary heart disease.  He had bypass surgery in 2000  and was last cathed in 2003 at which time his LIMA was occluded at the  LAD, but he had nonobstructive disease in the native LAD, patent vein  graft to the diagonal branch and a patent vein to the right coronary  artery.  He has done quite well.  He has had no recent chest pain,  shortness of breath or palpitations.  He is retired from his plumbing  business and has not been very active.   PAST MEDICAL HISTORY:  1. Significant for hypertension.  2. Hyperlipidemia.  3. GERD.  4. Continued cigarette use.  He indicated he has been able to quit      smoking.  5. He has had a previous nephrectomy for hypernephroma.   CURRENT MEDICATIONS:  1. Toprol.  2. Aspirin.  3. Lipitor.  4. Fish oil.   PHYSICAL EXAMINATION:  VITAL SIGNS:  Blood pressure 120/76, pulse 77 and  regular.  NECK:  There was no venous distension.  The carotid pulses were full and  there were no bruits.  CHEST:  Clear.  HEART:  Rhythm is regular.  There are no murmurs, rubs or gallops.  ABDOMEN:  Soft with normal bowel sounds.  There was no  hepatosplenomegaly.  EXTREMITIES:  Peripheral pulses were full with no peripheral edema.   DIAGNOSTICS:  Electrocardiogram showed sinus rhythm and minor  nonspecific ST/T changes.   IMPRESSION:  I think Curtis Galloway is doing quite well.  I had talked to him about  losing weight, becoming more active and decreasing the carbohydrates in  his diet.  We will plan to see him back in followup in a year.  We will  probably do a Myoview scan after that visit as it will  be 10 years post-  bypass surgery at that time.     Bruce Elvera Lennox Juanda Chance, MD, [facility]  Electronically Signed   BRB/MedQ  DD: 10/16/2007  DT: 10/17/2007  Job #: [id number]

## 2011-02-15 NOTE — Discharge Summary (Signed)
NAME:  Curtis Galloway, Curtis Galloway                          ACCOUNT NO.:  [id number]   MEDICAL RECORD NO.:  [id number]                   PATIENT TYPE:  INP   LOCATION:  0379                                 FACILITY:  [facility]   PHYSICIAN:  Lucrezia Starch. Ovidio Hanger, M.D.           DATE OF BIRTH:  08/19/1941   DATE OF ADMISSION:  09/06/2003  DATE OF DISCHARGE:  09/09/2003                                 DISCHARGE SUMMARY   DIAGNOSIS:  Left renal mass, subsequently renal cell carcinoma.   OPERATIVE PROCEDURE:  Left radical nephrectomy on September 06, 2003.   HISTORY AND PHYSICAL EXAMINATION:  Curtis Galloway is a very nice 70 year old  white male who was incidentally found to have a left renal mass after he  underwent a spinal MRI for evaluation of his lower back.  He underwent a CT  scan with contrast that demonstrated enhancing left renal mass, worrisome  for renal cell carcinoma.  Complete metastatic work-up was negative.  The  tumor was approximately 4 cm with involvement of the hilum, and it was felt  that partial nephrectomy was not indicated, and after considering all  options, including laparoscopic and hand-assisted laparoscopic techniques,  elected to proceed with radial nephrectomy.   PAST MEDICAL HISTORY/SOCIAL HISTORY/FAMILY HISTORY/REVIEW OF SYSTEMS:  Please see signed patient history and physical examination for full details.   PHYSICAL EXAMINATION:  GENERAL:  He is alert and oriented x3.  In no acute  distress generally.  VITAL SIGNS:  Stable.  HEENT:  Normal.  NECK:  Supple without masses or thyromegaly.  CHEST:  Normal.  There were no rales, rhonchi, or wheezes anteriorly or  posteriorly.  HEART:  Normal sinus rhythm without murmurs or gallops.  ABDOMEN:  Soft, nontender, without mass, organomegaly, or hernias.  EXTREMITIES:  Normal.  NEUROLOGIC:  Intact.  SKIN:  Normal.  GENITOURINARY:  Penis, meatus, scrotum, testicles intact.   HOSPITAL COURSE:  The patient was admitted.  After  undergoing proper  preoperative evaluation, he was subsequently taken to surgery on September 06, 2003, and underwent left radical nephrectomy uneventfully.  Immediately  postoperative, he was comfortable, the wound was dry, and he was doing well  without difficulty.  By September 07, 2003, he was afebrile, comfortable, with  slight nausea.  He continued to sleep easily, but was arousable and oriented  x3.  I&O was adequate.  The wound was clean and dry.  Clear liquids were  begun, and it was elected to keep the Foley in throughout the night.  Of  note, his white blood cell count was 16.8.  Hemoglobin was 13.1, hematocrit  37.8, and BMET was essentially normal.  Sodium was slightly low at 130.  ________ was slightly high at 139.  By postoperative day #2, September 08, 2003, he was afebrile, vital signs were stable, bowel sounds were positive,  fluids were decreased, he was ambulated, and Foley catheter was  discontinued, and  by September 09, 2003, he was doing very well, and was  subsequently discharged.  Specific instructions were given.   DISCHARGE MEDICATIONS:  He was given pain medications.   FOLLOW UP:  He was to follow up the next week for staple removal.   He was tolerating a diet with positive bowel movements.   Final pathology revealed a PT 1, _________ 3.5 cm renal cell carcinoma with  variable nuclear grade up to areas of grade IV, but predominantly with grade  II.                                               Ronald L. Ovidio Hanger, M.D.    RLD/MEDQ  D:  09/18/2003  T:  09/18/2003  Job:  [id number]

## 2011-02-15 NOTE — Assessment & Plan Note (Signed)
[facility]                            CARDIOLOGY OFFICE NOTE   PJ, ZEHNER                       MRN:          [id number]  DATE:10/13/2006                            DOB:          [birth year]    PRIMARY CARE PHYSICIAN:  Dr. Marga Melnick.   Mr. Sawaya is 70 years old and had bypass surgery in 2000, and was  last cath'd in 2003. At that time, his LIMA to the LAD was occluded, but  the LAD had nonobstructive disease. The RIMA to the ramus was occluded,  and the vein graft to diagonal branch and the vein graft to posterior  descending and posterior lateral branch of the right coronary artery  were patent. His LV function was normal.   He has been doing fairly well. Has had no recent chest pain or  palpitations. However, he has had symptoms of fatigue. He says he gives  out easily. 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   He also says that he gets cold very easily.   PAST MEDICAL HISTORY:  Significant for hypertension, hyperlipidemia,  GERD, and continued cigarette use. He also is status post left  nephrectomy for hypernephroma.   CURRENT MEDICATIONS:  1. Toprol.  2. Aspirin.  3. Lipitor.  4. Multivitamins.  5. Fish oil.   PHYSICAL EXAMINATION:  VITAL SIGNS:  Blood pressure 122/71, pulse 83 and  regular.  NECK:  There was no venous distention. Carotid pulses were full without  bruits.  CHEST:  Clear.  HEART:  Rhythm was regular. I could hear no murmurs or gallops.  ABDOMEN:  Soft with normal bowel sounds. There was a left-sided surgical  scar. There was no hepatosplenomegaly.  EXTREMITIES:  Peripheral pulses are full. There is no peripheral edema.   An electrocardiogram showed an incomplete right bundle branch block and  nonspecific ST-T change.   IMPRESSION:  1. Coronary artery disease status post coronary bypass graft surgery      in 2000 with cor anatomy as described above at  catheterization in      2003.  2. Symptoms of fatigue and decreased exercise tolerance.  3. Normal LV function.  4. Hypertension.  5. Hyperlipidemia.  6. Heat intolerance.  7. Gastroesophageal reflux disease.  8. Status post left nephrectomy for hypernephroma.  9. Continued cigarette use.   RECOMMENDATIONS:  I think Tamer is probably doing well from a standpoint  of his heart. He does have symptoms of fatigue and decreased exercise  tolerance. Will plan to do an adenosine rest stress Myoview scan to  assess him for an ischemic equivalent. Will also get a TSH because of  his cold intolerance. His other laboratory work was good including his  lipid profile which was very close to target. Will also give him a trial  of Chantix to see if we can help with his smoking. I will plan to see  him in a year or sooner if his Myoview scan is abnormal.     Bruce R. Juanda Chance, MD, [facility]  Electronically Signed  BRB/MedQ  DD: 10/13/2006  DT: 10/13/2006  Job #: [id number]

## 2011-02-15 NOTE — Cardiovascular Report (Signed)
[place]  Patient:    Curtis Galloway, Curtis Galloway                         MRN: [id number] Proc. Date: 12/20/99 Adm. Date:  [id number] Attending:  Talitha Galloway CC:         Curtis Galloway. Curtis Galloway, M.D. [facility]             Curtis Galloway, M.D. [facility]             Cardiopulmonary Laboratory                        Cardiac Catheterization  CLINICAL HISTORY:  Mr. Curtis Galloway is 69 years old and had bypass surgery eleven months ago.  He was admitted yesterday with chest pain occurring at rest somewhat similar to his pre-CABG pain.  DESCRIPTION OF PROCEDURE:  The procedure was performed via the right femoral artery using an arterial sheath and 6 French preformed coronary catheters.  A front wall arterial puncture was performed and Omnipaque contrast was used.  We used a JR4  catheter for injection of the vein grafts and a LIMA catheter for injection of he LIMA graft.  Distal aortogram was performed to evaluate his known peripheral vascular disease.  RESULTS:  Aortic pressure was 124/72.  Left ventricular pressure was 124/19.  Left main coronary artery:  The left main coronary artery was free of significant disease.  Left anterior descending:  The left anterior descending artery gave rise to a first small diagonal branch, and a septal perforator and a second diagonal branch was  occluded.  There was no competing flow distally seen and the internal mammary graft was not seen.  There was 70% narrowing after the first diagonal branch before the first septal perforator.  Circumflex artery:  The circumflex artery gave rise to an intermediate branch and a marginal branch.  There was 70% stenosis in the intermediate branch.  Right coronary artery:  The right coronary is a moderate sized vessel that was diffusely diseased with 90% and 70% proximal in-stent stenoses, 90% stenosis in the distal vessel and 90% stenosis in the posterior descending branch and in the  AV  branch after the posterior descending branch.  Surprisingly, even with a patent  graft, competing flow was not evident distally.  The saphenous vein graft to the posterior descending and posterolateral branches of the right coronary was patent and functioned normally.  The saphenous vein graft to the diagonal branch and LAD was patent and functioned normally.  The free LIMA graft, which arose from the saphenous vein graft to the diagonal branch was atrophic but did provide a small amount of filling to the intermediate branch.  The LIMA graft was completely occluded about halfway to two thirds a way down its course.  LEFT VENTRICULOGRAPHY:  The left ventriculogram was performed in the RAO projection showed good wall motion with no areas of hypokinesis.  The estimated ejection fraction was 60%.  DISTAL AORTOGRAM:  The distal aortogram was performed which showed irregularities in the aortoiliac system but no major obstruction.  CONCLUSIONS:  Coronary artery disease, status post coronary artery bypass graft  surgery, April 2000.  The native circulation shows 70% stenosis in the proximal lad and total occlusion in the second diagonal branch, 70% stenosis in the intermediate branch of the circumflex artery, and multiple 90% stenosis in the right coronary artery.  The grafts show a patent sequential  vein graft to the posterior descending and posterolateral branches to the right coronary artery.  A patent vein graft o the diagonal branch and the LAD, and a trophic free RIMA graft to the intermediate branch of the circumflex artery and a totally occluded LIMA graft.  The left ventricular function is normal.  RECOMMENDATIONS:  The patient has occlusion of the LIMA graft.  It appears that he is probable getting adequate flow down the native circulation with only 70% proximal stenosis.  We will plan initial medical therapy and will add Imdur to is current regimen pending  a Cardiolite scan which we will do as an outpatient. DD:  12/20/99 TD:  12/20/99 Job: [id number]

## 2011-02-15 NOTE — Op Note (Signed)
NAME:  Curtis Galloway, Curtis Galloway                          ACCOUNT NO.:  [id number]   MEDICAL RECORD NO.:  [id number]                   PATIENT TYPE:  INP   LOCATION:  0379                                 FACILITY:  [facility]   PHYSICIAN:  Lucrezia Starch. Earlene Plater, M.D.               DATE OF BIRTH:  10-22-1940   DATE OF PROCEDURE:  09/06/2003  DATE OF DISCHARGE:                                 OPERATIVE REPORT   PREOPERATIVE DIAGNOSES:  Left renal mass.   POSTOPERATIVE DIAGNOSES:  Left renal mass.   PROCEDURE:  Left radical nephrectomy.   SURGEON:  Lucrezia Starch. Earlene Plater, M.D.   ASSISTANT:  Susanne Borders, MD   ANESTHESIA:  General endotracheal.   DRAINS:  16 French Foley catheter to straight drain.   ESTIMATED BLOOD LOSS:  150 mL.   PATHOLOGY:  Left kidney for pathologic analysis.   COMPLICATIONS:  None.   DISPOSITION:  To post anesthesia care unit in stable condition.   INDICATIONS FOR PROCEDURE:  Curtis Galloway is a 70 year old male who was  recently evaluated with a MRI for low back pain.  Incidentally, he was found  to have a large left renal mass and was referred to Dr. Earlene Plater for this.  He  subsequently underwent a CT scan with and without infusion which  demonstrated a solid enhancing mass which was consistent with a renal cell  carcinoma.  Treatment options were discussed with the patient but given the  size and location of the lesion to the left renal hilum partial nephrectomy  would be quite difficult. The patient decided to go ahead with the left  radical nephrectomy after understanding the risks, benefits, and  alternatives.   DESCRIPTION OF PROCEDURE:  The patient was brought to the operating room and  correctly identified by his identification bracelet.  He was given  preoperative antibiotics and general endotracheal anesthesia.  He was placed  in the modified left flank position after being prepped and draped in  typical sterile fashion.  An approximately 12 cm subcostal incision was made  from the midline to the tip of the eleventh rib.  The skin was incised with  a scalpel and Camper's and Scarpa's fascia were incised with Bovie  electrocautery down to the external oblique fascia laterally and the rectus  sheath medially.  The muscle layers and their corresponding fascia were  incised with the Bovie electrocautery down to the peritoneum.  The  peritoneum was carefully incised with the Bovie electrocautery revealing the  abdominal cavity.  The extent of the incision was completed laterally and  medially.  The white line of Toldt was identified laterally and taken down  with Metzenbaum scissors.  The colon was then reflected medially revealing  the retroperitoneal space.  With blunt dissection, the kidney was palpated.  The patient had a significant amount of desmoplastic reaction around it with  some dense adherent tissue to the surrounding structures.  These tissues  were first taken down at the level of the spleen with care taken not to  damage the spleen or splenic vessels. Dissection was carried out inferiorly  with Bovie electrocautery down to the lower pole, freeing the kidney up  posteriorly.  Inferior to the kidney, tissue bands were developed with right  angle and clipped with large clips and incised.  The left ureter was  identified medially and was doubly clipped and ligated with Metzenbaum  scissors.  The Bookwalter retractor was placed for maximal exposure.  The  superior medial aspect of the kidney more desmoplastic tissue was taken down  and the plane between the duodenum and the hilum was developed with sharp  dissection as this space could not be developed bluntly due to the dense  desmoplastic tissue.  The renal artery could be palpated at the hilum and  areolar and desmoplastic tissue was carefully removed from the renal  vessels.  The vein was encountered first and a right angle was passed under  the vein and a vessiloop was placed around the vein.  With  careful  dissection, the renal artery was also dissected freely and a #1 silk tie was  passed around the artery distally.  The renal vein then became very floppy  as the renal arterial supply had been cut off.  The renal vein was then  taken by placing two proximal #1 silk ties and one distal silk tie and  incising the vein with Metzenbaum scissors.  Attention was then turned to  freeing up the remainder of the inferior aspect of the hilum.  Careful  dissection with Metzenbaum scissors was performed in this area and a second  accessory renal vein was identified which was doubly ligated and incised.  A  small Hem-o-Lok  clip was also placed on it proximally.  Two proximal #1  silk ties were placed around the renal artery and this was also incised with  Metzenbaum scissors.  The only remaining tissue was just medial to the upper  pole of the kidney and involved dense adherence to the adrenal gland.  This  tissue was bluntly and sharply dissected and a small rent was made in the  capsule of the adrenal gland which was over sewn with a running 2-0 Chromic  suture.  The kidney was thus freed entirely and was passed off the table for  pathologic analysis.  The wound was copiously irrigated with antibiotic  solution.  The wound was carefully inspected for any signs of bleeding and  none were found. The large and small bowel were carefully inspected and a  small mesenteric defect was identified and the sigmoid mesentery which was  closed with a running 3-0 chromic.  The rectus sheath medially and the  internal oblique and transversalis fascia were closed in a single layer with  a #1 PDS.  The wound was copiously irrigated again and the external oblique  fascia was reapproximated with another #1 PDS.  The skin was closed with a  stapling device.  All sponge and needle counts were correct at the end of  the case.  There were no complications.  The patient awakened easily from his anesthesia and was  taken to the post anesthesia care unit in stable  condition having tolerated the procedure well.  Please note that Dr. Earlene Plater  was present and participated in all aspects of this case as he was the  primary surgeon.     Susanne Borders, MD  Ronald L. Earlene Plater, M.D.    DR/MEDQ  D:  09/06/2003  T:  09/07/2003  Job:  [id number]

## 2011-02-15 NOTE — Op Note (Signed)
NAME:  Curtis Galloway, BRULL NO.:  [id number]   MEDICAL RECORD NO.:  [id number]                   PATIENT TYPE:  OIB   LOCATION:  3010                                 FACILITY:  [facility]   PHYSICIAN:  Hilda Lias, M.D.                DATE OF BIRTH:  [birth year]   DATE OF PROCEDURE:  10/12/2003  DATE OF DISCHARGE:                                 OPERATIVE REPORT   POSTOPERATIVE DIAGNOSES:  1. Chronic back pain with left L3-4 herniated disk.  2. Right L2-3, L4-5 foraminal stenosis status post left 4-5 diskectomy 20     years ago.   POSTOPERATIVE DIAGNOSES:  1. Chronic back pain with left L3-4 herniated disk.  2. Right L2-3, L4-5 foraminal stenosis status post left 4-5 diskectomy 20     years ago.   PROCEDURES:  1. Left L3-4 diskectomy.  2. Right 2-3 and 4-5 foraminotomies using microscope.   SURGEON:  Hilda Lias, M.D.   BRIEF HISTORY:  The patient is a 70 year old gentleman who has been  complaining about back pain with radiation down both legs, sometimes the  left worse than the right, bilaterally.  On the right side sometimes the  pain had been going to the knee and below the knee and had been going on  down to the foot.  On the left side the pain goes to the hip and then  sometimes to the left foot.  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  I sent him to the urologist and resection of the  tumor was done.  Nevertheless, he complains of continued shoulder, back and  bilateral leg pain.  The patient wanted to proceed with surgery.  X-rays  showed that he has a left L3-4 herniated disk and at the level on the right  side he has L2-3 and L4-5 stenosis and probably bilateral disk at 2-3.  Surgery was advised.  The patient knew all the risks such as no improvement  whatsoever, CSF leak, worsening of pain and paralysis.  The patient failed  with everything of possible treatment including selective nerve root  injection.   PROCEDURE:  The patient was taken to the operating room and positioned  appropriately.  The back was prepped with Betadine.  A midline incision over  3-4 was made.  On the left side we identified the area where he had the  previous surgery after retraction of the muscle was done.  From then on, we  identified 3-4.  We brought the microscope onto the area and with the drill,  we drilled the lamina of L4 and L3.  A thick yellow ligament also was  excised.  Then with the microscope we identified the thecal sac.  Indeed,  there was a herniated disk with soft tissue down into the body of L4.  An  incision was made.  We entered the  disk space and a large amount of  particulate disk was removed.  Then we performed decompression of L3 and L4  nerve roots.  From then on, we went on to the right side.  We identified the  L4-5 space and we drilled the lamina of L4 and L5.  The yellow ligament was  also excised.  With the 2 and 3 mm Kerrison punch, we did a foraminotomy to  decompress the L5 nerve root.  At the end we had a good decompression in  that area.  Prior to surgery the patient was telling me of pain going to his  right groin and to the right side.  I explored the area between 2 and 3.  The yellow ligament was also excised.  The lower lamina of L2 and upper of  L3 also were drilled.  Indeed, we found that he has some mild narrowing of  the canal.  The disk was  bulging, but there was no need for any type of diskectomy.  Decompression  was achieved.  Having done this the area was irrigated.  X-ray showed that  indeed we were in the right area.  From then on Fentanyl and Depo-Medrol  were left in the prevertebral space and the wound was closed with Vicryl and  Steri-Strips.                                               Hilda Lias, M.D.    EB/MEDQ  D:  10/12/2003  T:  10/12/2003  Job:  [id number]

## 2011-02-15 NOTE — Cardiovascular Report (Signed)
NAME:  Curtis Galloway, Curtis Galloway NO.:  [id number]   MEDICAL RECORD NO.:  [id number]                   PATIENT TYPE:  INP   LOCATION:  3740                                 FACILITY:  [facility]   PHYSICIAN:  Charlies Constable, MD [facility]                DATE OF BIRTH:  1941/08/26   DATE OF PROCEDURE:  07/21/2002  DATE OF DISCHARGE:                              CARDIAC CATHETERIZATION   CLINICAL HISTORY:  The patient is 70 years old and had bypass surgery in  2000.  He was last studied in 2001, at which time he had an atrophic LIMA to  the LAD but only 70% narrowing in the proximal LAD and an atrophic RIMA to  the intermedius branch of the circumflex artery but only 70% stenosis in  that vessel.  He has done well on medical therapy but recently has had some  increased chest pain which has been both at rest and with exertion.  He was  admitted by Dr. Jens Som with a diagnosis of possible unstable angina.   DESCRIPTION OF PROCEDURE:  The procedure was performed via the right femoral  artery using an arterial sheath and #6 [place] preformed coronary catheters.  A front wall arterial puncture was performed and Omnipaque contrast was  used.  A JR4 catheter was used for injection of the vein graft.  The patient  tolerated the procedure well and left the laboratory in satisfactory  condition.   RESULTS:  1. Left main coronary artery:  The left main coronary artery was free of     significant disease.  2. Left anterior descending artery:  The left anterior descending artery     gave rise to a diagonal branch and a septal perforator.  There was 50-70%     narrowing in the proximal LAD after the diagonal branch.  The rest of the     LAD was irregular but free of significant obstruction.  3. Circumflex artery:  The circumflex artery gave rise to an intermedius     branch and an AV branch which terminated in a posterolateral branch.  The     intermedius branch was small in caliber and  irregular but with no major     obstruction.  There were tandem 40% stenoses in the mid circumflex     artery.  4. Right coronary artery:  The right coronary artery was completely occluded     proximally.  5. The saphenous vein graft to the posterior descending and posterolateral     branch of the right coronary artery was patent and functioned normally.  6. The saphenous vein graft to the diagonal branch of the LAD was patent and     functioned normally.  7. The RIMA graft to the intermedius vessel was atrophic and arose from the     saphenous vein graft to the diagonal branch.  8. The LIMA graft to the  LAD was atrophic by prior study.  9. Left ventriculogram:  The left ventriculogram performed in the RAO     projection showed good wall motion with no areas of hypokinesis.  The     estimated ejection fraction was 65%.   PRESSURES:  1. The aortic pressure was 133/76 with a mean of 101.  2. Left ventricular pressure was 133/14.   CONCLUSION:  1. Coronary artery disease status post coronary artery bypass graft surgery     in April 2000.  2. Native vessel disease with 50-70% stenosis in the proximal left anterior     descending artery, total occlusion of the second diagonal branch of the     left anterior descending artery, 40% narrowing in the mid circumflex     artery, and total occlusion of the right coronary artery.  3. Patent saphenous vein graft to the posterior descending and     posterolateral branch of the right coronary artery, patent saphenous vein     graft to the diagonal branch of the left anterior descending artery,     atrophic right internal mammary graft to the intermedius branch of the     circumflex artery, and atrophic left internal mammary artery graft to the     left anterior descending artery by prior study.  4. Good left ventricular function.   RECOMMENDATIONS:  Although the patient has two atrophic arterial grafts, the  vessels that they supply are not  critically diseased.  I see no major source  of ischemia and no major change from his prior study.  Will plan reassurance  and discharge the patient later today.                                               Charlies Constable, MD [facility]    BB/MEDQ  D:  07/21/2002  T:  07/21/2002  Job:  [id number]   cc:   Titus Dubin. Alwyn Ren, M.D. [facility]   Cardiac Catheterization Lab

## 2011-02-15 NOTE — Discharge Summary (Signed)
NAME:  KHI, MCMILLEN NO.:  [id number]   MEDICAL RECORD NO.:  [id number]                   PATIENT TYPE:  INP   LOCATION:  2861                                 FACILITY:  [facility]   PHYSICIAN:  Charlies Constable, MD [facility]                DATE OF BIRTH:  08/06/1941   DATE OF ADMISSION:  07/19/2002  DATE OF DISCHARGE:  07/21/2002                           DISCHARGE SUMMARY - REFERRING   PROCEDURE:  1. Cardiac catheterization.  2. Coronary arteriogram.  3. Left ventriculogram.  4. Graft angiogram.   HOSPITAL COURSE:  Mr. Limas is a 70 year old male with a history of  coronary artery disease and bypass surgery in 2000 with his last catheter in  2001. He came to the emergency room on 07/19/02 because of chest pain that  had occurred two days before admission-a short episode, a longer episode the  day before admission, and then again on the day of admission. The pain has  been continuous for greater than 12 hours, and he was admitted to rule out  MI and further evaluation.   His enzymes were negative for MI, and he was scheduled for a cardiac  catheterization which was performed on 07/21/02. The cardiac catheterization  showed a normal left main and a LAD with a 50 to 70% proximal stenosis. The  first diagonal was totalled in the proximal portion, but the RIMA to  diagonal was patent. The circumflex had two 40% stenoses, and there was some  moderate irregularities in the ramus intermedius. The RCA was totalled  proximally, but the SVG to distal RCA and PDA was patent. His EF was  approximately 65% with no wall motion abnormalities and no MR. The films  were reviewed by Dr. Juanda Chance, and the LIMA to LAD was totalled by prior  study, and there was no change this time. The RIMA to the ramus intermedius  was atretic and not functioning. As previously described, the SVG to  diagonal and the SVG to PD and PL were patent.   The patient had had a D-dimer checked as  well in addition to his other lab  work, and this was negative. Because his D-dimer was normal and because his  cardiac catheterization showed no source of ischemia, he was considered  stable for discharge on 07/21/02 without patient followup.   LABORATORY DATA:  Hemoglobin 12.9, hematocrit 37.3, WBCs 6.6, platelets 237.  Sodium 137, potassium 3.5, chloride 102, CO2 26, BUN 9, creatinine 0.8,  glucose 107. Serial CK-MB and troponin I negative for MI. D-dimer 0.22.   Chest x-ray:  Cardiomediastinal contours are normal, status post  mediastinotomy and CABG. There is mild right basilar scarring but no active  findings.   DISCHARGE CONDITION:  Stable.   DISCHARGE DIAGNOSES:  1. Chest pain, no critical lesions without bypass grafts by catheterization     this admission.  2. Preserved left ventricular function with an  ejection fraction of 65%.  3. Status post aortocoronary bypass surgery in 4/00 with LIMA to LAD, RIMA     to ramus intermedius, SVG to PD and PL, and SVG to diagonal. LIMA and     RIMA nonfunctioning by catheterization this admission.  4. Hyperlipidemia.  5. Ongoing tobacco use.  6. History of percutaneous transluminal coronary angioplasty and stent x2.  7. History of intolerance to codeine.  8. Status post appendectomy, tonsillectomy, right knee repair, and back     surgery.  9. Burning with urination, urinalysis pending at the time of dictation.   DISCHARGE INSTRUCTIONS:  1. His activity level is to include no driving or sexual or strenuous     activity for two days.  2. He is to stick to a low-fat diet.  3. He is call the office with problems with the catheterization site.  4. He is to not use tobacco.  5. He is to see Dr. Juanda Chance in one year. He is to have a PA appointment for     Dr. Juanda Chance on 11/5 at 12:30. He is to followup with Dr. Alwyn Ren as needed.   DISCHARGE MEDICATIONS:  1. Coated aspirin 325 mg q.d.  2. Toprol-XL 25 mg q.d.  3. Lipitor 40 mg q.d.      Lavella Hammock, P.A. [facility]                  Charlies Constable, MD [facility]    RG/MEDQ  D:  07/21/2002  T:  07/21/2002  Job:  [id number]   cc:   Titus Dubin. Alwyn Ren, M.D. [facility]

## 2011-02-15 NOTE — H&P (Signed)
NAME:  Curtis, Galloway NO.:  [id number]   MEDICAL RECORD NO.:  [id number]                   PATIENT TYPE:  INP   LOCATION:  X007                                 FACILITY:  [facility]   PHYSICIAN:  Susanne Borders, MD                     DATE OF BIRTH:  09-May-1941   DATE OF ADMISSION:  09/06/2003  DATE OF DISCHARGE:                                HISTORY & PHYSICAL   CHIEF COMPLAINT:  Left kidney mass.   HISTORY OF PRESENT ILLNESS:  Curtis Galloway is a 70 year old gentleman who was  incidentally found to have a left renal mass after he underwent a spinal MRI  for evaluation of his lower back pain. The patient was found to have a left  renal mass and was subsequently referred to Dr. Earlene Plater. The patient had a CT  scan with contrast which demonstrated an enhancing left renal mass which was  worrisome for renal cell carcinoma. The patient had a complete metastatic  workup which was negative. The patient's treatment options were discussed  with him, but it was recommended that he undergo nephrectomy and he  consented to this after understanding the risks, benefits, and alternatives.   PAST MEDICAL HISTORY:  Significant for coronary artery disease,  hypertension, degenerative disk disease, hiatal hernia, gastroesophageal  reflux disease.   PAST SURGICAL HISTORY:  1. Coronary artery bypass graft.  2. Neck surgery.  3. Back surgery.  4. Right knee replacement.  5. Appendectomy.   MEDICATIONS:  1. Toprol.  2. Aspirin.  3. Lipitor.  4. Multivitamin.  5. Prilosec.   ALLERGIES:  CODEINE.   FAMILY HISTORY:  Has a brother who recently died from an unknown malignancy.   SOCIAL HISTORY:  Significant for cigar smoking. The patient denies alcohol  or illicit drug use.   REVIEW OF SYSTEMS:  Positive for coronary artery disease, lower back pain,  gastroesophageal reflux disease; otherwise, negative times all systems.   PHYSICAL EXAMINATION:  GENERAL: Alert and  oriented times three, in no acute  distress.  VITAL SIGNS: Temperature 97.3, pulse 100, respirations 18, blood pressure  135/80.  NECK: Supple without masses. No evidence of lymphadenopathy.  HEENT:  Normocephalic and atraumatic. Oropharynx is clear without erythema  or exudate.  LUNGS: Clear to auscultation bilaterally without rales, rhonchi, or wheezes.  CARDIOVASCULAR: Regular rate and rhythm; 2+ dorsalis pedis and posterior  tibialis pulses.  ABDOMEN: Soft, nontender, and positive bowel sounds.  No costovertebral  angle tenderness bilaterally.  EXTREMITIES: Warm and well-perfused.  NEUROLOGIC: No focal motor or sensory deficits appreciated.   IMPRESSION:  Left renal mass.   PLAN:  The patient is to undergo a left radical nephrectomy today. He will  be admitted to Dr. Earlene Plater' service postoperatively for his convalescence.  Susanne Borders, MD    DR/MEDQ  D:  09/06/2003  T:  09/06/2003  Job:  [id number]

## 2011-02-15 NOTE — H&P (Signed)
NAME:  Curtis Galloway, Curtis Galloway NO.:  [id number]   MEDICAL RECORD NO.:  [id number]                   PATIENT TYPE:  INP   LOCATION:  1824                                 FACILITY:  [facility]   PHYSICIAN:  Olga Millers, MD [facility]              DATE OF BIRTH:  Mar 06, 1941   DATE OF ADMISSION:  07/19/2002  DATE OF DISCHARGE:                                HISTORY & PHYSICAL   HISTORY OF PRESENT ILLNESS:  The patient is a 70 year old male with a past  medical history of coronary artery disease, hypertension, hyperlipidemia,  and gastroesophageal reflux disease who presents with chest pain.  The  patient has a long cardiac history. He is status post coronary artery bypass  graft in April of 2000.  His most recent cardiac catheterization was  performed in March of 2001.  At that time, he was found to have normal LV  function. His left main was free of significant disease. The LAD had a 70%  proximal lesion and there was a total diagonal. The circumflex had a 70%  intermediate.  The right coronary artery had multiple 90% lesions including  a proximal, mid, and distal. There was also a 90% distal PDA.  The  sequential saphenous vein graft to the posterior descending and  posterolateral was patent. The saphenous vein graft to the diagonal was  patent. The LIMA to the LAD was totaled. The free RIMA to the OM was  atrophic.  Since that time, the patient has been treated medically.  He  denies any exertional chest pain, although, he does have chronic dyspnea on  exertion. He has no orthopnea or PND, but there is chronic mild pedal edema.  The patient states that this past Friday, he began noticing chest tightness  that was similar to his symptoms prior to his PCI and coronary artery bypass  graft.  He had chest pain for approximately one hour Friday, two hours  Saturday, as well as most of the day yesterday.  The pain has been  continuous since yesterday morning and therefore is  approximately 36 hours  in duration at this point. There is no associated nausea, vomiting,  diaphoresis, or shortness of breath. The pain is not pleuritic or positional  and no related to food. It is not clearly exertional. It does radiate to the  back and down the left upper extremity which made him concerned today.  Of  note, the patient did travel to [address] three weeks ago, however, he denies  any hemoptysis or leg pain.   MEDICATIONS:  1. Aspirin.  2. Toprol 25 mg p.o. q.d.  3. Lipitor 40 mg p.o. q.d.   ALLERGIES:  CODEINE.   SOCIAL HISTORY:  He does smoke, but does not consume alcohol.   FAMILY HISTORY:  Strongly positive for coronary artery disease.   PAST MEDICAL HISTORY:  Significant for hypertension, hyperlipidemia, but  there  is no diabetes mellitus. He does have a history of gastroesophageal  reflux disease.  He is status post coronary artery bypass graft as described  in the HPI. He has had a prior right knee surgery, back surgery,  appendectomy, and tonsillectomy.   REVIEW OF SYSTEMS:  He denies any headaches or fevers or chills.  There is  no productive cough or hemoptysis. There is no dysphagia, odynophagia,  melena, or hematochezia. There is no dysuria or hematuria.  There is no rash  or seizure activity. There is no orthopnea or PND, but there is mild pedal  edema.  Remaining systems are negative.   PHYSICAL EXAMINATION:  VITAL SIGNS:  Pulse 95.  GENERAL:  He is well-developed and well-nourished and in no acute distress.  SKIN:  Warm and dry.  HEENT:  Unremarkable with no myeloids.  NECK:  Supple with normal motion bilaterally.  No bruits noted.  There is no  jugular venous distention and no thyromegaly noted.  CHEST:  Mildly diminished breath sounds in the right lower lobe, but is  otherwise clear to auscultation.  HEART:  Regular rate and rhythm with normal S1 and S2. There are no murmurs,  rubs, or gallops noted.  The patient is status post sternotomy.   ABDOMEN:  Nontender, nondistended, positive bowel sounds, no  hepatosplenomegaly and no masses appreciated.  No abdominal bruit. He has 2+  femoral pulses bilaterally. There is a right femoral bruit noted.  EXTREMITIES:  No edema and I can palpate no cords.  He is status post vein  harvesting.  There are 2+ dorsalis pedis pulses bilaterally.  NEUROLOGY:  Grossly intact.   His electrocardiogram shows a normal sinus rhythm at a rate of 95.  There is  an RV conduction delay and minor nonspecific ST changes.  The remaining  laboratories and chest x-ray are pending at the time of this dictation.   ADMISSION DIAGNOSES:  1. Chest pain concerning for angina.  2. Coronary artery disease, status post coronary artery bypass graft.  3. Hypertension.  4. Hyperlipidemia.  5. History of gastroesophageal reflux disease.   PLAN:  The patient presents with chest pain that has both typical and  atypical features.  It is persistent at present and electrocardiogram shows  minor nonspecific ST changes.  We will admit and rule out myocardial  infarction with serial enzymes. We will treat with aspirin, heparin, and  nitroglycerin and we will also continue his Toprol and Lipitor.  I have  discussed the risks and benefits of cardiac catheterization with the patient  and he agrees to proceed.  If we can make him pain-free, then we will plan  to proceed tomorrow morning.  If we are unable to make him pain-free, then  he may need to go more urgently.  We will also check a D-dimer given his  recent travel to [address], although, I think pulmonary embolism is unlikely.  We will make further recommendations once we have the above information.                                               Olga Millers, MD [facility]    BC/MEDQ  D:  07/19/2002  T:  07/19/2002  Job:  [id number]

## 2011-02-15 NOTE — Op Note (Signed)
NAME:  NOVAK, STGERMAINE NO.:  [id number]   MEDICAL RECORD NO.:  [id number]                   PATIENT TYPE:  INP   LOCATION:  2861                                 FACILITY:  [facility]   PHYSICIAN:  Lina Sar, M.D. [facility]               DATE OF BIRTH:  1940/10/17   DATE OF PROCEDURE:  DATE OF DISCHARGE:  07/21/2002                                 OPERATIVE REPORT   PROCEDURE PERFORMED:  Upper endoscopy and esophageal dilation.   ENDOSCOPIST:  Lina Sar, M.D. [facility]   INDICATIONS FOR PROCEDURE:  This 70 year old gentleman has experienced  recurrent chest pain.  He has a history of coronary artery disease and has  been fully evaluated by Dr. Charlies Constable having cardiac catheterization in  the last several weeks.  No active lesions were found and his chest pain was  thought to be noncardiac.  He has a history of gastroesophageal reflux and  has been on Prilosec.  Last endoscopy in 1996, showed erosive esophagitis  with sliding  hiatal hernia.  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  He has some dysphagia and chest pain.  He  is undergoing upper endoscopy to assess his chest pain.   ENDOSCOPE:  Olympus single channel video endoscope.   SEDATION:  Versed 5 mg IV, Demerol 50 mg IV.   FINDINGS:  The Olympus single channel video endoscope passed was into the  mouth and through the esophagus.  The esophagus was inspected.  The patient  was cooperative.  Proximal and mid esophageal mucosa was unremarkable.  In  the distal esophagus, there were two mild fibrous swings, one at the GE  junction and one about 1-2 cm proximal to the GE junction.  They were  nonobstructing and they were fibrous.  There was a small hiatal hernia  measuring 3 cm in length which was easily reducible with the endoscope.   Stomach.  The stomach was insufflated with air.  The gastric body and  __________  were normal.  There were marked superficial erosions  in the  gastric antrum with pinpoint mucosal ulceration. The pyloric outlet was  normal.  CLOtest was taken from the gastric antrum as well as video  photographs.  Mucosa of the duodenum, duodenal bulb and descending duodenum  was normal.  The endoscope was then brought back into the stomach,  retroflexed and fundus and cardia were visualized and appeared normal.   A guidewire was then placed into the stomach and several dilators were  passed over the guidewire using 15 and 17 mm dilators.  Each was pass over  the guidewire with endoscopic guidance.  There was no blood on the dilator.  The patient tolerated the procedure well.   IMPRESSION:  1. Marked nonobstructing distal esophageal stricture.  Status post dilation     to 51-French.  2. Antral gastritis, status  post CLOtest.   PLAN:  1. Await results of the CLOtest.  2. Nexium 40 mg b.i.d. x1week, then one daily.  3. Decrease aspirin to 81 mg p.o. daily.  4.     Decrease caffeine intake.  5. Consider smooth muscle relaxers to prevent esophageal spasm.  6. Diazepam 2 mg at h.s.                                               Lina Sar, M.D. [facility]    DB/MEDQ  D:  08/04/2002  T:  08/04/2002  Job:  [id number]

## 2011-02-15 NOTE — Discharge Summary (Signed)
[place]. [facility]  Patient:    Curtis Galloway, Curtis Galloway                         MRN: Adm. Date:  12/19/99 Disc. Date: 12/20/99 Attending:  Luis Abed, M.D. [facility] Dictator:   Gene Serpe, P.A. CC:         Titus Dubin. Alwyn Ren, M.D. [facility]                           Discharge Summary  PROCEDURE:  Cardiac catheterization on December 20, 1999.  HISTORY OF PRESENT ILLNESS:  Mr. Curtis Galloway is a 70 year old male, a patient of Dr. Everardo Beals. Brodies, status post six vessel CABG in April 2000, who presented o the cardiology office as an add-on, with symptoms suggestive of angina pectoris. He was awakened by early morning chest discomfort on the day of admission.  He ad been having some chest pain for the past few days.  He had not taken any nitroglycerin, thinking that it was too old.  On presentation, an electrocardiogram did not reveal any acute changes; however, his chest pain was persistent and initially refractory to sublingual nitroglycerin.  By the time of transfer to [facility] he had reported some amelioration of his discomfort.  LABORATORY DATA:  Cardiac enzymes:  CPK/MB negative x 2.  Troponin I less than .03 x 3.  CBC and metabolic profile within normal limits on admission.  HOSPITAL COURSE:  The patient ruled out for a myocardial infarction with negative serial cardiac enzymes.  A cardiac catheterization by Dr. Juanda Chance.  (See the cardiac catheterization report for full details.)  This revealed new 100% occlusion of he LIMA-LAD graft, with otherwise patent free RIMA-OM.  Atrophic distal limb, patent SVG-PAD-PL graft, patent SVG-DX graft.  The LV-gram:  Revealed normal LV, ejection fraction of 60%.  Distal aortogram revealed no significant aortoiliac or renal stenosis.  Dr. Juanda Chance concluded that the LIMA-LAD occlusion did not appear to be recent. he felt that there was probable adequate flow through the native LAD.  RECOMMENDATIONS:  For  subsequent discharge and outpatient Cardiolite.  Low-dose  Imdur was added.  DISCHARGE MEDICATIONS: 1. Coated aspirin 325 mg q.d. 2. Toprol XL 50 mg q.d. 3. Lipitor 40 mg q.d. 4. Imdur 30 mg q.d. 5. Nitrostat as directed.  INSTRUCTIONS:  The patient is to refrain from strenuous activity or driving x two days.  He is to maintain a low-fat, low-cholesterol diet, and to call our office if there is any bleeding or swelling of the groin.  FOLLOWUP:  The patient is scheduled for an exercise rest/stress Cardiolite at [facility] on Monday, December 24, 1999, at 8:30 a.m.  He is instructed to hold his Toprol that morning.  The patient is then scheduled to follow up with Dr. Juanda Chance on Tuesday, January 01, 2000, at 4:45 p.m.  DISCHARGE DIAGNOSES: 1. Status post unstable angina pectoris:    a. Negative serial cardiac enzymes.    b. Cardiac catheterization on December 20, 1999:  100% left internal mammary       artery-left anterior descending coronary artery (appears old), otherwise       patent grafts.    c. Normal left ventricle.    d. Coronary artery bypass grafting six-vessel in April 2000.    e. High speed rotational atherectomy/percutaneous transluminal coronary       angioplasty of the right coronary artery in  December 1999. 2. Hypercholesterolemia. 3. Hypertension. 4. Gastroesophageal reflux disease. 5. History of tobacco.DD:  12/20/99 TD:  12/20/99 Job: [id number]

## 2011-02-15 NOTE — H&P (Signed)
NAME:  Curtis Galloway, Curtis Galloway NO.:  [id number]   MEDICAL RECORD NO.:  [id number]                   PATIENT TYPE:  OIB   LOCATION:  NA                                   FACILITY:  [facility]   PHYSICIAN:  Hilda Lias, M.D.                DATE OF BIRTH:  [birth year]   DATE OF ADMISSION:  10/12/2003  DATE OF DISCHARGE:                                HISTORY & PHYSICAL   Mr. Bonfanti is a gentleman who was seen by me because of back pain with  radiation down to both legs but lately the pain has been going mostly to the  right thigh into the knee ________ knee.  Sometimes he complains of burning  sensation in the right leg.  As a part of the work-up, the patient had MRI  and we found that indeed he has localized cancer of the left kidney.  Since  then, he underwent surgery by Dr. Earlene Plater on the kidney, he is doing really  well.  Chief complaint of pain down to the right leg.  He complains of pain  in the left leg but most of the pain is going to the right one.   PAST MEDICAL HISTORY:  1. Surgery on the left side at 4-5 many years ago.  2. He has a posterior cervical foraminotomy.  3. He has had removal of malignant tumor on the left kidney.   SOCIAL HISTORY:  Negative.   FAMILY HISTORY:  Negative.   PHYSICAL EXAMINATION:  GENERAL APPEARANCE:  The patient came into my office  limping on the right leg.  HEENT:  Normal.  NECK:  Normal.  LUNGS:  Clear.  CARDIOVASCULAR:  Normal.  ABDOMEN:  Normal.  EXTREMITIES:  Normal pulses.  NEUROLOGIC:  Mental status normal.  Cranial nerves normal.  Strength 5/5  except that he has some weakness of the right quadriceps on the right  iliopsoas.  He has some numbness in the right quadriceps with femoral  stretch maneuver which is positive in the right side, negative on the left  side.   The x-ray showed that indeed he has a herniated disc at the level of 3-4 and  stenosis at the level of 4-5.   CLINICAL IMPRESSION:  Chronic  back pain with radiation down to right leg and  also left leg.   RECOMMENDATIONS:  I talked to the patient at length.  The procedure will be  a right 3-4 and 4-5 foraminotomy and discectomy.  We are going to take a  look also at the left side.  The patient knows of the risks such as  infection, CSF leak, worsening of pain, paralysis, need for another surgery  which might require fusion.  Hilda Lias, M.D.    EB/MEDQ  D:  10/12/2003  T:  10/12/2003  Job:  [id number]

## 2011-08-05 ENCOUNTER — Other Ambulatory Visit: Payer: Self-pay | Admitting: Urology

## 2011-08-05 DIAGNOSIS — N320 Bladder-neck obstruction: Secondary | ICD-10-CM

## 2011-08-05 DIAGNOSIS — C649 Malignant neoplasm of unspecified kidney, except renal pelvis: Secondary | ICD-10-CM

## 2011-08-05 DIAGNOSIS — N529 Male erectile dysfunction, unspecified: Secondary | ICD-10-CM

## 2011-08-07 ENCOUNTER — Ambulatory Visit
Admission: RE | Admit: 2011-08-07 | Discharge: 2011-08-07 | Disposition: A | Discharge status: Home / Self Care | Payer: Medicare HMO | Source: Ambulatory Visit | Attending: Urology | Admitting: Urology

## 2011-08-07 ENCOUNTER — Other Ambulatory Visit: Payer: Self-pay | Admitting: Urology

## 2011-08-07 DIAGNOSIS — N320 Bladder-neck obstruction: Secondary | ICD-10-CM

## 2011-08-07 DIAGNOSIS — N529 Male erectile dysfunction, unspecified: Secondary | ICD-10-CM

## 2011-08-07 DIAGNOSIS — C649 Malignant neoplasm of unspecified kidney, except renal pelvis: Secondary | ICD-10-CM

## 2011-08-07 LAB — CREATININE, SERUM: Creat: 1.2 mg/dL (ref 0.50–1.35)

## 2011-08-07 MED ORDER — IOHEXOL 300 MG/ML  SOLN
100.0000 mL | Freq: Once | INTRAMUSCULAR | Status: AC | PRN
  Administered 2011-08-07: 100 mL via INTRAVENOUS

## 2011-09-30 ENCOUNTER — Encounter: Payer: Self-pay | Admitting: Cardiovascular Disease

## 2011-10-02 ENCOUNTER — Encounter: Payer: Self-pay | Admitting: Cardiovascular Disease

## 2011-10-02 ENCOUNTER — Ambulatory Visit (INDEPENDENT_AMBULATORY_CARE_PROVIDER_SITE_OTHER): Payer: Medicare HMO | Admitting: Cardiovascular Disease

## 2011-10-02 DIAGNOSIS — Z79899 Other long term (current) drug therapy: Secondary | ICD-10-CM

## 2011-10-02 DIAGNOSIS — I10 Essential (primary) hypertension: Secondary | ICD-10-CM

## 2011-10-02 DIAGNOSIS — I2510 Atherosclerotic heart disease of native coronary artery without angina pectoris: Secondary | ICD-10-CM

## 2011-10-02 DIAGNOSIS — E7800 Pure hypercholesterolemia, unspecified: Secondary | ICD-10-CM

## 2011-10-02 LAB — CBC WITH DIFFERENTIAL/PLATELET
Eosinophils Relative: 0.7 % (ref 0.0–5.0)
HCT: 42 % (ref 39.0–52.0)
Lymphs Abs: 1.7 10*3/uL (ref 0.7–4.0)
Monocytes Relative: 7.1 % (ref 3.0–12.0)
Neutrophils Relative %: 73 % (ref 43.0–77.0)
Platelets: 212 10*3/uL (ref 150.0–400.0)
WBC: 8.9 10*3/uL (ref 4.5–10.5)

## 2011-10-02 LAB — HEPATIC FUNCTION PANEL
AST: 21 U/L (ref 0–37)
Albumin: 4 g/dL (ref 3.5–5.2)
Alkaline Phosphatase: 66 U/L (ref 39–117)
Total Protein: 7.2 g/dL (ref 6.0–8.3)

## 2011-10-02 LAB — BASIC METABOLIC PANEL
CO2: 29 mEq/L (ref 19–32)
Glucose, Bld: 102 mg/dL — ABNORMAL HIGH (ref 70–99)
Potassium: 4.1 mEq/L (ref 3.5–5.1)
Sodium: 143 mEq/L (ref 135–145)

## 2011-10-02 LAB — LIPID PANEL
HDL: 53.1 mg/dL (ref >39.00)
Total CHOL/HDL Ratio: 3

## 2011-10-02 NOTE — Assessment & Plan Note (Signed)
Cholesterol is at goal.  Continue current dose of statin and diet Rx.  No myalgias or side effects.  F/U  LFT's in 6 months. Lab Results  Component Value Date   LDLCALC 101* 10/02/2010   Labs today

## 2011-10-02 NOTE — Progress Notes (Signed)
This is a 71 year old white male patient, who has history of CABG in 2000 and underwent cardiac catheter in 2003, which showed his LIMA to the LAD was occluded, but he had nonobstructive disease in the LAD. His SVG to the diagonal and RCA were patent. He had normal LV function.  He is here today for one-year followup. He denies chest pain, palpitations, dyspnea, dyspnea on exertion, dizziness, or presyncope. 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 He admits to going off his diet over the holidays and gaining a few pounds.  Needs labs and refill on nitro.  Spending a lot of time at [employer] near [employer]  ROS: Denies fever, malais, weight loss, blurry vision, decreased visual acuity, cough, sputum, SOB, hemoptysis, pleuritic pain, palpitaitons, heartburn, abdominal pain, melena, lower extremity edema, claudication, or rash.  All other systems reviewed and negative  General: Affect appropriate Healthy:  appears stated age HEENT: normal Neck supple with no adenopathy JVP normal no bruits no thyromegaly Lungs clear with no wheezing and good diaphragmatic motion Heart:  S1/S2 no murmur,rub, gallop or click PMI normal Abdomen: benighn, BS positve, no tenderness, no AAA no bruit.  No HSM or HJR Distal pulses intact with no bruits No edema Neuro non-focal Skin warm and dry No muscular weakness   Current Outpatient Prescriptions  Medication Sig Dispense Refill  . aspirin 81 MG tablet Take 81 mg by mouth daily.        Marland Kitchen atorvastatin (LIPITOR) 80 MG tablet Take 80 mg by mouth daily.        . fish oil-omega-3 fatty acids 1000 MG capsule Take 1 g by mouth daily.        . metoprolol (TOPROL-XL) 50 MG 24 hr tablet Take 50 mg by mouth daily.        . Multiple Vitamin (MULTIVITAMIN) tablet Take 1 tablet by mouth daily.        Marland Kitchen omeprazole (PRILOSEC) 20 MG capsule Take 20 mg by mouth daily as needed.         Allergies  Codeine and Tramadol  Electrocardiogram:  NSR 70   Inferolateral T wave changes  No change from 2011  Assessment and Plan

## 2011-10-02 NOTE — Assessment & Plan Note (Signed)
Stable no angina.  New nitro called in

## 2011-10-02 NOTE — Assessment & Plan Note (Signed)
Well controlled.  Continue current medications and low sodium Dash type diet.    

## 2011-10-02 NOTE — Patient Instructions (Signed)
Please have lab work today  The current medical regimen is effective;  continue present plan and medications.  Follow up in 1 year with Dr Eden Emms.  You will receive a letter in the mail 2 months before you are due.  Please call us when you receive this letter to schedule your follow up appointment.

## 2011-10-07 ENCOUNTER — Telehealth: Payer: Self-pay | Admitting: Cardiovascular Disease

## 2011-10-07 NOTE — Telephone Encounter (Signed)
FU Call: Pt wife returning call to [place]. Please return pt call to discuss further.

## 2011-10-07 NOTE — Telephone Encounter (Signed)
PT'S  WIFE AWARE  OF LAB RESULTS./CY 

## 2011-10-10 ENCOUNTER — Telehealth: Payer: Self-pay | Admitting: Cardiovascular Disease

## 2011-10-10 NOTE — Telephone Encounter (Signed)
New msg New rx for three  meds Atorvastatin Metoprolol Nitroglycerin Please call them back

## 2011-10-10 NOTE — Telephone Encounter (Signed)
[facility] notified of dosing and directions of Atorvastatin and Metoprolol per Dr. Fabio Bering last office note 10/02/11.  I do not see where Dr Eden Emms has prescribed nitroglycerin so it was not authorized. The pt is new to this pharmacy. The pharmacy is waiting on the pts profile to be transferred to them from another pharmacy as they are unsure if the pt has had a nitroglycerin script in the past.

## 2011-10-11 ENCOUNTER — Other Ambulatory Visit: Payer: Self-pay | Admitting: Cardiovascular Disease

## 2011-10-11 MED ORDER — ATORVASTATIN CALCIUM 80 MG PO TABS: 80.0000 mg | ORAL_TABLET | Freq: Every day | ORAL | Status: DC

## 2011-10-11 MED ORDER — METOPROLOL SUCCINATE ER 50 MG PO TB24: 50.0000 mg | ORAL_TABLET | Freq: Every day | ORAL | Status: DC

## 2011-10-11 MED ORDER — NITROGLYCERIN 0.4 MG SL SUBL: 0.4000 mg | SUBLINGUAL_TABLET | SUBLINGUAL | Status: DC | PRN

## 2011-11-01 ENCOUNTER — Encounter (HOSPITAL_COMMUNITY)
Admission: EM | Disposition: A | Discharge status: Home / Self Care | Payer: Self-pay | Source: Ambulatory Visit | Attending: Cardiology

## 2011-11-01 ENCOUNTER — Inpatient Hospital Stay (HOSPITAL_COMMUNITY)
Admission: EM | Admit: 2011-11-01 | Discharge: 2011-11-02 | DRG: 287 | Disposition: A | Discharge status: Home / Self Care | Payer: Medicare Other | Source: Ambulatory Visit | Attending: Cardiology | Admitting: Cardiology

## 2011-11-01 ENCOUNTER — Encounter (HOSPITAL_COMMUNITY): Payer: Self-pay | Admitting: Emergency Medicine

## 2011-11-01 ENCOUNTER — Other Ambulatory Visit: Payer: Self-pay

## 2011-11-01 DIAGNOSIS — Z905 Acquired absence of kidney: Secondary | ICD-10-CM

## 2011-11-01 DIAGNOSIS — I2582 Chronic total occlusion of coronary artery: Secondary | ICD-10-CM | POA: Diagnosis present

## 2011-11-01 DIAGNOSIS — I2510 Atherosclerotic heart disease of native coronary artery without angina pectoris: Secondary | ICD-10-CM

## 2011-11-01 DIAGNOSIS — Z7982 Long term (current) use of aspirin: Secondary | ICD-10-CM

## 2011-11-01 DIAGNOSIS — I25810 Atherosclerosis of coronary artery bypass graft(s) without angina pectoris: Secondary | ICD-10-CM | POA: Diagnosis present

## 2011-11-01 DIAGNOSIS — Z79899 Other long term (current) drug therapy: Secondary | ICD-10-CM

## 2011-11-01 DIAGNOSIS — I10 Essential (primary) hypertension: Secondary | ICD-10-CM

## 2011-11-01 DIAGNOSIS — E785 Hyperlipidemia, unspecified: Secondary | ICD-10-CM | POA: Diagnosis present

## 2011-11-01 DIAGNOSIS — R079 Chest pain, unspecified: Secondary | ICD-10-CM

## 2011-11-01 DIAGNOSIS — I200 Unstable angina: Secondary | ICD-10-CM

## 2011-11-01 DIAGNOSIS — K219 Gastro-esophageal reflux disease without esophagitis: Secondary | ICD-10-CM

## 2011-11-01 HISTORY — PX: CARDIAC CATHETERIZATION: SHX172

## 2011-11-01 HISTORY — PX: LEFT HEART CATHETERIZATION WITH CORONARY/GRAFT ANGIOGRAM: SHX5450

## 2011-11-01 LAB — POCT I-STAT, CHEM 8
HCT: 41 % (ref 39.0–52.0)
Hemoglobin: 13.9 g/dL (ref 13.0–17.0)
Potassium: 3.7 mEq/L (ref 3.5–5.1)
Sodium: 143 mEq/L (ref 135–145)

## 2011-11-01 LAB — URINALYSIS, ROUTINE W REFLEX MICROSCOPIC
Bilirubin Urine: NEGATIVE
Glucose, UA: NEGATIVE mg/dL
Leukocytes, UA: NEGATIVE
Nitrite: NEGATIVE
Specific Gravity, Urine: 1.045 — ABNORMAL HIGH (ref 1.005–1.030)
pH: 5.5 (ref 5.0–8.0)

## 2011-11-01 LAB — PROTIME-INR
INR: 1.15 (ref 0.00–1.49)
Prothrombin Time: 14.9 seconds (ref 11.6–15.2)

## 2011-11-01 LAB — COMPREHENSIVE METABOLIC PANEL
AST: 48 U/L — ABNORMAL HIGH (ref 0–37)
Albumin: 3.5 g/dL (ref 3.5–5.2)
Alkaline Phosphatase: 84 U/L (ref 39–117)
BUN: 17 mg/dL (ref 6–23)
CO2: 26 mEq/L (ref 19–32)
Chloride: 104 mEq/L (ref 96–112)
GFR calc non Af Amer: 70 mL/min — ABNORMAL LOW (ref >90)
Potassium: 3.6 mEq/L (ref 3.5–5.1)
Total Bilirubin: 0.3 mg/dL (ref 0.3–1.2)

## 2011-11-01 LAB — CARDIAC PANEL(CRET KIN+CKTOT+MB+TROPI): Total CK: 58 U/L (ref 7–232)

## 2011-11-01 LAB — DIFFERENTIAL
Basophils Absolute: 0 10*3/uL (ref 0.0–0.1)
Eosinophils Relative: 1 % (ref 0–5)
Lymphocytes Relative: 23 % (ref 12–46)
Lymphs Abs: 2.1 10*3/uL (ref 0.7–4.0)
Monocytes Absolute: 0.9 10*3/uL (ref 0.1–1.0)
Neutro Abs: 6 10*3/uL (ref 1.7–7.7)

## 2011-11-01 LAB — CBC
HCT: 39.4 % (ref 39.0–52.0)
Hemoglobin: 13.8 g/dL (ref 13.0–17.0)
MCV: 91.2 fL (ref 78.0–100.0)
RBC: 4.32 MIL/uL (ref 4.22–5.81)
RDW: 12.8 % (ref 11.5–15.5)
WBC: 9 10*3/uL (ref 4.0–10.5)

## 2011-11-01 LAB — POCT ACTIVATED CLOTTING TIME: Activated Clotting Time: 111 seconds

## 2011-11-01 SURGERY — LEFT HEART CATHETERIZATION WITH CORONARY/GRAFT ANGIOGRAM

## 2011-11-01 SURGERY — LEFT HEART CATHETERIZATION WITH CORONARY ANGIOGRAM
Anesthesia: LOCAL

## 2011-11-01 MED ORDER — ASPIRIN 81 MG PO CHEW
CHEWABLE_TABLET | ORAL | Status: AC
  Filled 2011-11-01: qty 4

## 2011-11-01 MED ORDER — SODIUM CHLORIDE 0.9 % IV SOLN: 250.0000 mL | INTRAVENOUS | Status: DC | PRN

## 2011-11-01 MED ORDER — ONDANSETRON HCL 4 MG/2ML IJ SOLN: 4.0000 mg | Freq: Four times a day (QID) | INTRAMUSCULAR | Status: DC | PRN

## 2011-11-01 MED ORDER — SODIUM CHLORIDE 0.9 % IV SOLN: INTRAVENOUS | Status: DC

## 2011-11-01 MED ORDER — SODIUM CHLORIDE 0.45 % IV SOLN
INTRAVENOUS | Status: AC
  Administered 2011-11-01: 18:00:00 via INTRAVENOUS

## 2011-11-01 MED ORDER — PRASUGREL HCL 10 MG PO TABS: 10.0000 mg | ORAL_TABLET | Freq: Every day | ORAL | Status: DC

## 2011-11-01 MED ORDER — OMEGA-3-ACID ETHYL ESTERS 1 G PO CAPS
1.0000 g | ORAL_CAPSULE | Freq: Every day | ORAL | Status: DC
  Administered 2011-11-01 – 2011-11-02 (×2): 1 g via ORAL
  Filled 2011-11-01 (×2): qty 1

## 2011-11-01 MED ORDER — ACETAMINOPHEN 325 MG PO TABS: 650.0000 mg | ORAL_TABLET | ORAL | Status: DC | PRN

## 2011-11-01 MED ORDER — HEPARIN (PORCINE) IN NACL 100-0.45 UNIT/ML-% IJ SOLN: 850.0000 [IU]/h | INTRAMUSCULAR | Status: DC

## 2011-11-01 MED ORDER — NITROGLYCERIN IN D5W 200-5 MCG/ML-% IV SOLN
10.0000 ug/min | INTRAVENOUS | Status: DC
  Administered 2011-11-01: 10 ug/min via INTRAVENOUS
  Filled 2011-11-01: qty 250

## 2011-11-01 MED ORDER — MORPHINE SULFATE 4 MG/ML IJ SOLN
INTRAMUSCULAR | Status: AC
  Filled 2011-11-01: qty 1

## 2011-11-01 MED ORDER — ZOLPIDEM TARTRATE 5 MG PO TABS: 5.0000 mg | ORAL_TABLET | Freq: Every evening | ORAL | Status: DC | PRN

## 2011-11-01 MED ORDER — ASPIRIN EC 325 MG PO TBEC: 325.0000 mg | DELAYED_RELEASE_TABLET | Freq: Every day | ORAL | Status: DC

## 2011-11-01 MED ORDER — ONDANSETRON HCL 4 MG/2ML IJ SOLN
4.0000 mg | Freq: Four times a day (QID) | INTRAMUSCULAR | Status: DC | PRN
  Administered 2011-11-02: 4 mg via INTRAVENOUS
  Filled 2011-11-01: qty 2

## 2011-11-01 MED ORDER — MORPHINE SULFATE 4 MG/ML IJ SOLN
8.0000 mg | Freq: Once | INTRAMUSCULAR | Status: AC
  Administered 2011-11-01: 8 mg via INTRAVENOUS
  Filled 2011-11-01: qty 2

## 2011-11-01 MED ORDER — SODIUM CHLORIDE 0.9 % IJ SOLN: 3.0000 mL | Freq: Two times a day (BID) | INTRAMUSCULAR | Status: DC

## 2011-11-01 MED ORDER — METOPROLOL SUCCINATE ER 50 MG PO TB24
50.0000 mg | ORAL_TABLET | Freq: Every day | ORAL | Status: DC
  Administered 2011-11-01 – 2011-11-02 (×2): 50 mg via ORAL
  Filled 2011-11-01 (×2): qty 1

## 2011-11-01 MED ORDER — MORPHINE SULFATE 2 MG/ML IJ SOLN
INTRAMUSCULAR | Status: AC
  Administered 2011-11-01: 2 mg via INTRAMUSCULAR
  Filled 2011-11-01: qty 1

## 2011-11-01 MED ORDER — HEPARIN BOLUS VIA INFUSION
4000.0000 [IU] | Freq: Once | INTRAVENOUS | Status: AC
  Administered 2011-11-01: 4000 [IU] via INTRAVENOUS

## 2011-11-01 MED ORDER — PANTOPRAZOLE SODIUM 40 MG PO TBEC
40.0000 mg | DELAYED_RELEASE_TABLET | Freq: Every day | ORAL | Status: DC
  Administered 2011-11-01 – 2011-11-02 (×2): 40 mg via ORAL
  Filled 2011-11-01 (×2): qty 1

## 2011-11-01 MED ORDER — ASPIRIN 81 MG PO CHEW
CHEWABLE_TABLET | ORAL | Status: AC
  Filled 2011-11-01: qty 3

## 2011-11-01 MED ORDER — ASPIRIN 81 MG PO CHEW: 324.0000 mg | CHEWABLE_TABLET | ORAL | Status: DC

## 2011-11-01 MED ORDER — PRASUGREL HCL 10 MG PO TABS
10.0000 mg | ORAL_TABLET | Freq: Every day | ORAL | Status: DC
  Administered 2011-11-02: 10 mg via ORAL
  Filled 2011-11-01: qty 1

## 2011-11-01 MED ORDER — SODIUM CHLORIDE 0.9 % IJ SOLN: 3.0000 mL | INTRAMUSCULAR | Status: DC | PRN

## 2011-11-01 MED ORDER — ALPRAZOLAM 0.25 MG PO TABS: 0.2500 mg | ORAL_TABLET | Freq: Two times a day (BID) | ORAL | Status: DC | PRN

## 2011-11-01 MED ORDER — ASPIRIN EC 325 MG PO TBEC
325.0000 mg | DELAYED_RELEASE_TABLET | Freq: Every day | ORAL | Status: DC
  Administered 2011-11-02: 325 mg via ORAL
  Filled 2011-11-01: qty 1

## 2011-11-01 MED ORDER — ADULT MULTIVITAMIN W/MINERALS CH
1.0000 | ORAL_TABLET | Freq: Every day | ORAL | Status: DC
  Administered 2011-11-01 – 2011-11-02 (×2): 1 via ORAL
  Filled 2011-11-01 (×2): qty 1

## 2011-11-01 MED ORDER — MORPHINE SULFATE 10 MG/ML IJ SOLN
2.0000 mg | INTRAMUSCULAR | Status: DC | PRN
  Administered 2011-11-01: 2 mg via INTRAVENOUS

## 2011-11-01 MED ORDER — HEPARIN (PORCINE) IN NACL 2-0.9 UNIT/ML-% IJ SOLN
INTRAMUSCULAR | Status: AC
  Filled 2011-11-01: qty 2000

## 2011-11-01 MED ORDER — DIAZEPAM 2 MG PO TABS: 2.0000 mg | ORAL_TABLET | ORAL | Status: DC | PRN

## 2011-11-01 MED ORDER — ONE-DAILY MULTI VITAMINS PO TABS: 1.0000 | ORAL_TABLET | Freq: Every day | ORAL | Status: DC

## 2011-11-01 MED ORDER — ASPIRIN 81 MG PO TABS: 81.0000 mg | ORAL_TABLET | Freq: Every day | ORAL | Status: DC

## 2011-11-01 MED ORDER — NITROGLYCERIN 0.4 MG SL SUBL: 0.4000 mg | SUBLINGUAL_TABLET | SUBLINGUAL | Status: DC | PRN

## 2011-11-01 MED ORDER — NITROGLYCERIN 0.2 MG/ML ON CALL CATH LAB
INTRAVENOUS | Status: AC
  Filled 2011-11-01: qty 1

## 2011-11-01 MED ORDER — HEPARIN SODIUM (PORCINE) 5000 UNIT/ML IJ SOLN
INTRAMUSCULAR | Status: AC
  Filled 2011-11-01: qty 1

## 2011-11-01 MED ORDER — LIDOCAINE HCL (PF) 1 % IJ SOLN
INTRAMUSCULAR | Status: AC
  Filled 2011-11-01: qty 30

## 2011-11-01 MED ORDER — ROSUVASTATIN CALCIUM 40 MG PO TABS
40.0000 mg | ORAL_TABLET | Freq: Every day | ORAL | Status: DC
  Administered 2011-11-01 – 2011-11-02 (×2): 40 mg via ORAL
  Filled 2011-11-01 (×2): qty 1

## 2011-11-01 MED ORDER — MIDAZOLAM HCL 2 MG/2ML IJ SOLN
INTRAMUSCULAR | Status: AC
  Filled 2011-11-01: qty 2

## 2011-11-01 MED ORDER — OMEGA-3 FATTY ACIDS 1000 MG PO CAPS: 1.0000 g | ORAL_CAPSULE | Freq: Every day | ORAL | Status: DC

## 2011-11-01 MED ORDER — PRASUGREL HCL 10 MG PO TABS
60.0000 mg | ORAL_TABLET | Freq: Once | ORAL | Status: AC
  Administered 2011-11-01: 60 mg via ORAL
  Filled 2011-11-01: qty 6

## 2011-11-01 MED ORDER — ASPIRIN 81 MG PO CHEW
324.0000 mg | CHEWABLE_TABLET | Freq: Once | ORAL | Status: AC
  Administered 2011-11-01: 324 mg via ORAL

## 2011-11-01 NOTE — Brief Op Note (Signed)
See operative note Charlton Haws 4:20 PM 11/01/2011

## 2011-11-01 NOTE — Interval H&P Note (Signed)
History and Physical Interval Note:  11/01/2011 4:19 PM  Curtis Galloway  has presented today for surgery, with the diagnosis of Chest pain  The various methods of treatment have been discussed with the patient and family. After consideration of risks, benefits and other options for treatment, the patient has consented to  Procedure(s): LEFT HEART CATHETERIZATION WITH CORONARY ANGIOGRAM as a surgical intervention .  The patients' history has been reviewed, patient examined, no change in status, stable for surgery.  I have reviewed the patients' chart and labs.  Questions were answered to the patient's satisfaction.     Charlton Haws  4:19 PM 11/01/2011

## 2011-11-01 NOTE — ED Notes (Signed)
Cp started this am at 9am  Some sob

## 2011-11-01 NOTE — Op Note (Signed)
Catheterization  Indication: chest pain  Procedure: After informed consent and clinical "time out" the right groin was prepped and draped in a sterile fashion.  A 5Fr sheath was placed in the right femoral artery using seldinger technique and local lidocaine.  Standard JL4, JR4 and angled pigtail catheters were used to engage the coronary arteries.  Coronary arteries were visualized in orthogonal views using caudal and cranial angulation.  RAO ventriculography was not done because of a single kidney and Cr of 1.3    Medications:   Versed: 2 mg's  Fentanyl: 25 ug's  Coronary Arteries: Right dominant with no anomalies  LM: 30%  LAD: 50-60% proximal LAD just after a small diffusely diseased D1.  Mid vessel 30% disease.  Distal vessel is tented from LIMA with minor disease  D2 100%  IM:  40-50% medium size vessel   Circumflex:  40% mid.  Large OM1 30-40% multiple discrete lesions.  AV circ occluded     RCA:  100% ostial occlusion with only filling of  RV/SA branch.  Occluded proximal stents.  Extensive Left to right collaterals to RCA  Grafts: SVG: to PDA occluded New since 2003 SVG: Diagonal occluded New since 2003 Free Rima:  Came off diagonal graft atretic in 2003 Lima:  Known to be occluded cath 2003   Hemodynamics:  Aortic Pressure: 134  LV Pressure: 135 18  mmHg  Impression:   Despite all his grafts being occluded circulation appears stable for medical Rx.  Proximal LAD similar to 2003.  Collaterals to RCA Add Effient and nitrates.  D/C in am for outpatient F/U  Enzymes negative so far and no acute ECG changes.  Check Cr in am   Charlton Haws 11/01/2011 5:51 PM

## 2011-11-01 NOTE — ED Provider Notes (Signed)
History     CSN: [id number]  Arrival date & time 11/01/11  1357   First MD Initiated Contact with Patient 11/01/11 1400      Chief Complaint  Patient presents with  . Chest Pain    (Consider location/radiation/quality/duration/timing/severity/associated sxs/prior treatment) HPI Comments: Patient is a 71 year old man had coronary artery bypass grafting about 12 years ago. About 7:30 this morning he had onset of pain in his left chest that radiated into his neck and left arm. He has been getting worse over the day. He took no medications.  When the pain did not remit he sought evaluation.  Patient is a 71 y.o. male presenting with chest pain. The history is provided by the patient and the spouse. No language interpreter was used.  Chest Pain The chest pain began 6 - 12 hours ago. Episode Length: Pain is persistent, steadily worsening over the day. Chest pain occurs constantly. The chest pain is worsening. Associated with: No precipitating event. The quality of the pain is described as pressure-like. The pain radiates to the left jaw and left shoulder. Exacerbated by: Nothing. Pertinent negatives for primary symptoms include no fever, no syncope, no shortness of breath, no cough, no nausea, no vomiting and no dizziness. He tried nothing for the symptoms. Risk factors include being elderly and male gender (Known coronary artery disease, status post CABG).  His past medical history is significant for CAD and MI.     Past Medical History  Diagnosis Date  . Hypernephroma   . Coronary artery disease   . GERD (gastroesophageal reflux disease)   . Hyperlipidemia   . Hypertension     Past Surgical History  Procedure Date  . Appendectomy   . Coronary artery bypass graft 2000     DR Charlies Constable  . Lumbar laminectomy     x 4  . Nephrectomy 2004  . Tonsillectomy   . Cervical laminectomy     x 1  . Colonscopy 2006    Dr. Lina Sar    Family History  Problem Relation Age of Onset    . Diabetes Mother   . Coronary artery disease Mother   . Heart attack Father     History  Substance Use Topics  . Smoking status: Former Smoker    Types: Cigarettes    Quit date: 10/01/1996  . Smokeless tobacco: Not on file  . Alcohol Use: Not on file      Review of Systems  Constitutional: Negative.  Negative for fever and chills.  HENT: Negative.   Eyes: Negative.   Respiratory: Negative for cough and shortness of breath.   Cardiovascular: Positive for chest pain. Negative for syncope.  Gastrointestinal: Negative.  Negative for nausea and vomiting.  Genitourinary: Negative.   Musculoskeletal: Negative.   Neurological: Negative for dizziness.  Hematological: Negative.   Psychiatric/Behavioral: Negative.     Allergies  Codeine and Tramadol  Home Medications   Current Outpatient Rx  Name Route Sig Dispense Refill  . ASPIRIN 81 MG PO TABS Oral Take 81 mg by mouth daily.      . ATORVASTATIN CALCIUM 80 MG PO TABS Oral Take 1 tablet (80 mg total) by mouth at bedtime. 90 tablet 3  . OMEGA-3 FATTY ACIDS 1000 MG PO CAPS Oral Take 1 g by mouth daily.      Marland Kitchen METOPROLOL SUCCINATE ER 50 MG PO TB24 Oral Take 1 tablet (50 mg total) by mouth daily. 90 tablet 3  . ONE-DAILY MULTI VITAMINS PO TABS  Oral Take 1 tablet by mouth daily.      Marland Kitchen OMEPRAZOLE 20 MG PO CPDR Oral Take 20 mg by mouth daily as needed. Acid reflux    . NITROGLYCERIN 0.4 MG SL SUBL Sublingual Place 1 tablet (0.4 mg total) under the tongue every 5 (five) minutes as needed for chest pain. 100 tablet 3    BP 171/75  Pulse 77  Temp(Src) 97.8 F (36.6 C) (Oral)  Resp 18  SpO2 99%  Physical Exam  Constitutional: He is oriented to person, place, and time. He appears well-developed and well-nourished.       In mild to moderate distress with chest pain  HENT:  Head: Normocephalic and atraumatic.  Right Ear: External ear normal.  Left Ear: External ear normal.  Mouth/Throat: Oropharynx is clear and moist.  Eyes:  Conjunctivae and EOM are normal. Pupils are equal, round, and reactive to light.  Neck: Normal range of motion. Neck supple. No JVD present.  Cardiovascular: Normal rate, regular rhythm and normal heart sounds.   Pulmonary/Chest: Breath sounds normal. No respiratory distress.  Abdominal: Soft. Bowel sounds are normal.  Musculoskeletal: Normal range of motion.  Neurological: He is alert and oriented to person, place, and time.       No sensory or motor deficit  Skin: Skin is warm and dry.  Psychiatric: He has a normal mood and affect. His behavior is normal.    ED Course  CRITICAL CARE Performed by: Osvaldo Human Authorized by: Osvaldo Human Total critical care time: 30 minutes Critical care was necessary to treat or prevent imminent or life-threatening deterioration of the following conditions: Chest pain, acute posterior MI suspected. Critical care was time spent personally by me on the following activities: development of treatment plan with patient or surrogate, discussions with consultants, evaluation of patient's response to treatment, examination of patient, obtaining history from patient or surrogate, ordering and review of laboratory studies, ordering and review of radiographic studies and re-evaluation of patient's condition.   (including critical care time)  2:14 PM  Date: 11/01/2011  Rate:82  Rhythm: normal sinus rhythm  QRS Axis: normal  Intervals: normal  ST/T Wave abnormalities: ST depression in V2 and V3, suggests possible posterior MI.    Conduction Disutrbances:none  Narrative Interpretation: Abnormal EKG.  Old EKG Reviewed: changes noted--ST changes are new since1/06/2004.   2:24 PM Pt seen --> physical exam performed.  Symptoms worrisome for acute MI and EKG suggests posterior MI.  CODE STEMI called.  Lab workup ordered.  PO ASA and IV heparin ordered.    2:40 PM Dr. Antoine Poche here, seeing patient.  He recommended IV morphine, heparin continuous  infusion, NTG continuous infusion.  Will take to cath lab today, timing to depend on his response to treatment.   1. Chest pain             Carleene Cooper III, MD 11/01/11 [phone]

## 2011-11-01 NOTE — ED Notes (Signed)
Dr. Caprice Red at pt bedside.

## 2011-11-01 NOTE — Progress Notes (Signed)
ANTICOAGULATION CONSULT NOTE - Initial Consult  Pharmacy Consult for heparin Indication: chest pain/ACS  Allergies  Allergen Reactions  . Codeine     REACTION: upset stomach  . Tramadol     Patient Measurements: Wt=72kg  Ht= 5' 9'' Heparin Dosing Weight: 72kg  Vital Signs: Temp: 97.9 F (36.6 C) (02/01 1425) Temp src: Oral (02/01 1350) BP: 174/77 mmHg (02/01 1425) Pulse Rate: 78  (02/01 1425)  Labs: No results found for this basename: HGB:2,HCT:3,PLT:3,APTT:3,LABPROT:3,INR:3,HEPARINUNFRC:3,CREATININE:3,CKTOTAL:3,CKMB:3,TROPONINI:3 in the last 72 hours The CrCl is unknown because both a height and weight (above a minimum accepted value) are required for this calculation.  Medical History: Past Medical History  Diagnosis Date  . Hypernephroma   . Coronary artery disease   . GERD (gastroesophageal reflux disease)   . Hyperlipidemia   . Hypertension     Assessment: 71 yo male with CP to start heparin  (heparin bolus 4000 units given in ED)  Goal of Therapy:  Heparin level 0.3-0.7 units/ml   Plan:  -Will continue heparin at 850 units/hr -Heparin level in 6hrs and daily with CBC daily  Benny Lennert 11/01/2011,3:00 PM

## 2011-11-01 NOTE — Progress Notes (Signed)
Received to cath lab holding area with 8/10 chest pain. BP 155/78  HR 88 Sinus rhythm RR14  SPO2 99% on 2lO2. Titrating IV NTG for pain control. Awaiting cath lab procedure room.

## 2011-11-01 NOTE — H&P (Signed)
Hospital Admission Note Date: 11/01/2011  Patient name: Curtis Galloway Medical record number: [id number] Date of birth: 1941/07/13 Age: 71 y.o. Gender: male PCP: Marga Melnick, MD, MD  Medical Service: Cardiology Attending physician:  Dr. Antoine Poche     Chief Complaint: chest pain  History of Present Illness: 71 yo M with PMH of CAD, GERD, HTN, HLP presents to ED for chest pain that started at 7:30AM on day of admission.  He describes his pain as "tight" as if something sitting on his chest when he was drinking coffee, radiating to left arm and neck, 10/10 in severity. The pain is located substernal-midchest.  He denies any nausea/vomiting, fever, chills, diaphoresis or cough, or SOB.  He reports having had bypass in 2001 and last cath in 2003 and has not had any similar chest pain since.   Last cath in 2003 results: LAD 50-70% stenosis Mid circ: 40% stenosis RCA-occluded RIMA- atrophic LIMA to LAD occluded   Meds: Medications Prior to Admission  Medication Dose Route Frequency Provider Last Rate Last Dose  . aspirin 81 MG chewable tablet           . aspirin chewable tablet 324 mg  324 mg Oral Once Carleene Cooper III, MD   324 mg at 11/01/11 1430  . heparin 5000 UNIT/ML injection           . heparin bolus via infusion 4,000 Units  4,000 Units Intravenous Once Carleene Cooper III, MD   4,000 Units at 11/01/11 1431   Medications Prior to Admission  Medication Sig Dispense Refill  . aspirin 81 MG tablet Take 81 mg by mouth daily.        Marland Kitchen atorvastatin (LIPITOR) 80 MG tablet Take 1 tablet (80 mg total) by mouth at bedtime.  90 tablet  3  . fish oil-omega-3 fatty acids 1000 MG capsule Take 1 g by mouth daily.        . metoprolol succinate (TOPROL-XL) 50 MG 24 hr tablet Take 1 tablet (50 mg total) by mouth daily.  90 tablet  3  . Multiple Vitamin (MULTIVITAMIN) tablet Take 1 tablet by mouth daily.        Marland Kitchen omeprazole (PRILOSEC) 20 MG capsule Take 20 mg by mouth daily as needed. Acid reflux       . nitroGLYCERIN (NITROSTAT) 0.4 MG SL tablet Place 1 tablet (0.4 mg total) under the tongue every 5 (five) minutes as needed for chest pain.  100 tablet  3    Allergies: Codeine and Tramadol Past Medical History  Diagnosis Date  . Hypernephroma/Left Nephrectomy 2004  . Coronary artery disease   . GERD (gastroesophageal reflux disease)   . Hyperlipidemia   . Hypertension    Past Surgical History  Procedure Date  . Appendectomy   . Coronary artery bypass graft 2000    Dr. Dorris Fetch 2001  . Lumbar laminectomy     x 4  . Nephrectomy 2004    Left  . Tonsillectomy   . Cervical laminectomy     x 1  . Colonscopy 2006    Dr. Lina Sar   Family History  Problem Relation Age of Onset  . Diabetes Mother   . Coronary artery disease Mother   . Heart attack Father    History   Social History  . Marital Status: Married    Spouse Name: N/A    Number of Children: N/A  . Years of Education: N/A   Occupational History  . Not on file.  Social History Main Topics  . Smoking status: Former Smoker    Types: Cigarettes    Quit date: 10/01/1996  . Smokeless tobacco: Not on file  . Alcohol Use: Not on file  . Drug Use: Not on file  . Sexually Active: Not on file   Other Topics Concern  . Not on file   Social History Narrative   RetiredMarriedFormer smokerAlcohol use-noRegular exercise-no    Review of Systems: As per HPI  Physical Exam: Blood pressure 174/77, pulse 78, temperature 97.9 F (36.6 C), temperature source Oral, resp. rate 21, SpO2 99.00%. General: mild distress 2/2 pain, alert, well-developed, and cooperative to examination.  Head: normocephalic and atraumatic.  Eyes: vision grossly intact, pupils equal, pupils round, pupils reactive to light, no injection and anicteric.  Mouth: pharynx pink and moist, no erythema, and no exudates.  Neck: supple, full ROM, no thyromegaly, no JVD, and no carotid bruits.  Lungs: normal respiratory effort, no accessory  muscle use, normal breath sounds, no crackles, and no wheezes. Heart: normal rate, regular rhythm, no murmur, no gallop, and no rub. Midline healed scar s/p bypass  Abdomen: soft, non-tender, normal bowel sounds, no distention, no guarding, no rebound tenderness. Left horizontal scar s/p L nephrectomy Msk: no joint swelling, no joint warmth, and no redness over joints.  Pulses: 2+ DP/PT pulses bilaterally Extremities: No cyanosis, clubbing, edema Neurologic: alert & oriented X3, cranial nerves II-XII intact, strength normal in all extremities, sensation intact to light touch, and gait normal.  Skin: turgor normal and no rashes. Dry skin on feet Psych: Oriented X3, memory intact for recent and remote, normally interactive, good eye contact, not anxious appearing, and not depressed appearing.   Lab results: Pending  Other results: EKG: Reviewed, HR 82, ST depression in V2 and V3   Assessment & Plan by Problem: 1. Unstable angina: clinical symptoms and EKG changes are consistent with unstable angina. Plan: Admit to CCU -Start IV Nitro, heparin,  -Continue ASA,statin, BB -IV Morphine 2mg  q2h PRN -Cycle cardiac enzymes -Check lipid panel -Catheterization with possible PCI today if his Cr is stable.   2. HTN- stable, will continue current home meds  3. HLP- last FLP was in jan 2013 with LDL of 92.  Will continue statin  4. GERD- stable, will continue Prilosec  VTE: heparin gtt   Signed: HO,MICHELE 11/01/2011, 2:42 PM

## 2011-11-01 NOTE — H&P (Signed)
History reviewed with the patient, no changes to be made.  I examined this patient with Dr. Anselm Jungling.  He presents with chest pain consistent with unstable angina.  His labs and CXR are pending.  EKG demonstrates no acute changes. However, is pain has been severe and similar to previous angina. The patient exam reveals lungs clear, COR RRR no rubs, ext no edema.  All available labs, radiology testing, previous records reviewed. Agree with documented assessment and plan. The patient will go urgently to the cath lab with Dr. Eden Emms.   Fayrene Fearing Davida Falconi  4:25 PM 08/16/2011

## 2011-11-02 DIAGNOSIS — I2510 Atherosclerotic heart disease of native coronary artery without angina pectoris: Principal | ICD-10-CM

## 2011-11-02 DIAGNOSIS — I200 Unstable angina: Secondary | ICD-10-CM

## 2011-11-02 LAB — BASIC METABOLIC PANEL
BUN: 16 mg/dL (ref 6–23)
CO2: 25 mEq/L (ref 19–32)
Calcium: 8.9 mg/dL (ref 8.4–10.5)
Creatinine, Ser: 1.09 mg/dL (ref 0.50–1.35)

## 2011-11-02 LAB — CBC
Hemoglobin: 12.5 g/dL — ABNORMAL LOW (ref 13.0–17.0)
MCH: 31.5 pg (ref 26.0–34.0)
MCHC: 34.2 g/dL (ref 30.0–36.0)
Platelets: 179 10*3/uL (ref 150–400)

## 2011-11-02 LAB — CARDIAC PANEL(CRET KIN+CKTOT+MB+TROPI)
Relative Index: INVALID (ref 0.0–2.5)
Relative Index: INVALID (ref 0.0–2.5)
Total CK: 57 U/L (ref 7–232)
Total CK: 53 U/L (ref 7–232)

## 2011-11-02 LAB — LIPID PANEL
LDL Cholesterol: 72 mg/dL (ref 0–99)
Total CHOL/HDL Ratio: 3.1 RATIO
VLDL: 20 mg/dL (ref 0–40)

## 2011-11-02 MED ORDER — OMEPRAZOLE 20 MG PO CPDR: 20.0000 mg | DELAYED_RELEASE_CAPSULE | Freq: Every day | ORAL | Status: DC

## 2011-11-02 MED ORDER — ISOSORBIDE MONONITRATE ER 30 MG PO TB24: 30.0000 mg | ORAL_TABLET | Freq: Every day | ORAL | Status: DC

## 2011-11-02 MED ORDER — PRASUGREL HCL 10 MG PO TABS: 10.0000 mg | ORAL_TABLET | Freq: Every day | ORAL | Status: DC

## 2011-11-02 NOTE — Progress Notes (Signed)
Pt c/o nausea, no complaints of pain, gave zofran per prn order, notified Theodore Demark, will continue to monitor, also gave pt some ginerale tosip on.

## 2011-11-02 NOTE — Discharge Summary (Signed)
CARDIOLOGY DISCHARGE SUMMARY   Patient ID: Curtis Galloway MRN: [id number] DOB/AGE: 12-30-1940 71 y.o.  Admit date: 11/01/2011 Discharge date: 11/02/2011  Primary Discharge Diagnosis: Anginal chest pain Secondary Discharge Diagnosis:  Patient Active Problem List  Diagnoses  . HYPERNEPHROMA  . HYPERLIPIDEMIA  . Essential hypertension, benign  . Coronary atherosclerosis of native coronary artery  . GERD  . HEADACHE  . Unstable angina   Procedures: Cardiac catheterization, coronary arteriogram, SVG angiogram, RIMA arteriogram  Hospital Course: Curtis Galloway is a 71 year old male with a history of coronary artery disease. He came to the hospital with chest pain and was admitted for further evaluation and treatment.  His cardiac enzymes were negative for MI. He was evaluated and it was felt that catheterization was indicated to further define his anatomy. He was taken to the cath lab on 11/01/2011. Dr Fabio Bering results and conclusions are listed below.  Coronary Arteries:  Right dominant with no anomalies  LM: 30%  LAD: 50-60% proximal LAD just after a small diffusely diseased D1. Mid vessel 30% disease. Distal vessel is tented from LIMA with minor disease D2 100% IM: 40-50% medium size vessel Circumflex: 40% mid. Large OM1 30-40% multiple discrete lesions. AV circ occluded  RCA: 100% ostial occlusion with only filling of RV/SA branch. Occluded proximal stents. Extensive Left to right collaterals to RCA  Grafts:  SVG: to PDA occluded New since 2003  SVG: Diagonal occluded New since 2003  Free Rima: Came off diagonal graft atretic in 2003  Lima: Known to be occluded cath 2003 Impression: Despite all his grafts being occluded circulation appears stable for medical Rx. Proximal LAD similar to 2003. Collaterals to RCA  Add Effient and nitrates.  On 11/02/2011, Curtis Galloway was having no further episodes of chest pain. He was ambulating well. He was evaluated by Dr. Diona Browner and  considered stable for discharge, to followup as an outpatient.  Labs:   Lab Results  Component Value Date   WBC 8.8 11/02/2011   HGB 12.5* 11/02/2011   HCT 36.5* 11/02/2011   MCV 91.9 11/02/2011   PLT 179 11/02/2011    Lab 11/01/11 1501 11/01/11 1428  NA 143 --  K 3.7 --  CL 107 --  CO2 -- 26  BUN 18 --  CREATININE 1.30 --  CALCIUM -- 9.1  PROT -- 7.0  BILITOT -- 0.3  ALKPHOS -- 84  ALT -- 25  AST -- 48*  GLUCOSE 89 --    Basename 11/02/11 0710 11/02/11 0007 11/01/11 1903  CKTOTAL 53 57 58  CKMB 2.4 2.4 2.4  CKMBINDEX -- -- --  TROPONINI <0.30 <0.30 <0.30   Lipid Panel     Component Value Date/Time   CHOL 160 10/02/2011 0954   TRIG 77.0 10/02/2011 0954   HDL 53.10 10/02/2011 0954   CHOLHDL 3 10/02/2011 0954   VLDL 15.4 10/02/2011 0954   LDLCALC 92 10/02/2011 0954   Basename 11/01/11 1428  INR 1.15   EKG: 01-Nov-2011 13:46:06 Normal sinus rhythm, rate 82 ST & T wave abnormality, consider inferior ischemia Abnormal ECG No significant change was found  FOLLOW UP PLANS AND APPOINTMENTS Discharge Orders    Future Appointments: Provider: Department: Dept Phone: Center:   11/11/2011 9:30 AM Pecola Lawless, MD [facility] [phone] [facility]  Follow up with Dr Antoine Poche, the office will call   Medication List  As of 11/02/2011  8:27 AM   TAKE these medications         aspirin 81 MG tablet  Take 81 mg by mouth daily.      atorvastatin 80 MG tablet   Commonly known as: LIPITOR   Take 1 tablet (80 mg total) by mouth at bedtime.      fish oil-omega-3 fatty acids 1000 MG capsule   Take 1 g by mouth daily.     NEW isosorbide mononitrate 30 MG 24 hr tablet   Commonly known as: IMDUR   Take 1 tablet (30 mg total) by mouth daily.      metoprolol succinate 50 MG 24 hr tablet   Commonly known as: TOPROL-XL   Take 1 tablet (50 mg total) by mouth daily.      multivitamin tablet   Take 1 tablet by mouth daily.      nitroGLYCERIN 0.4 MG SL tablet   Commonly known as:  NITROSTAT   Place 1 tablet (0.4 mg total) under the tongue every 5 (five) minutes as needed for chest pain.      omeprazole 20 MG capsule   Commonly known as: PRILOSEC   Take 1 capsule (20 mg total) by mouth daily. Acid reflux     NEW prasugrel 10 MG Tabs   Commonly known as: EFFIENT   Take 1 tablet (10 mg total) by mouth daily.           Follow-up Information    Follow up with Marga Melnick, MD .   Follow up with Dr Antoine Poche, the office will call      BRING ALL MEDICATIONS WITH YOU TO FOLLOW UP APPOINTMENTS  Time spent with patient to include physician time: 42 min Signed: Theodore Demark 11/02/2011, 8:27 AM Co-Sign MD

## 2011-11-02 NOTE — Progress Notes (Signed)
Pt feeling much better, notified Theodore Demark, ok to let go home.

## 2011-11-02 NOTE — Progress Notes (Signed)
Subjective: Eating breakfast. No chest pain or shortness of breath. No unusual pain and right groin site status post catheterization.   Objective: Temp:  [97.4 F (36.3 C)-97.9 F (36.6 C)] 97.6 F (36.4 C) (02/02 0500) Pulse Rate:  [64-82] 67  (02/02 0500) Resp:  [16-21] 20  (02/02 0000) BP: (120-174)/(46-98) 120/58 mmHg (02/02 0500) SpO2:  [92 %-100 %] 96 % (02/02 0500) Weight:  [171 lb 11.2 oz (77.883 kg)] 171 lb 11.2 oz (77.883 kg) (02/02 0500)  I/O last 3 completed shifts: In: 1300 [P.O.:480; I.V.:820] Out: 620 [Urine:620]  Telemetry - Sinus rhythm.  Exam -   General - NAD.  Lungs - Clear, nonlabored.  Cardiac - RRR, no rub or gallop.  Extremities - Right groin site stable, no hematoma or bruit.  Testing -   Lab Results  Component Value Date   WBC 8.8 11/02/2011   HGB 12.5* 11/02/2011   HCT 36.5* 11/02/2011   MCV 91.9 11/02/2011   PLT 179 11/02/2011    Lab Results  Component Value Date   CREATININE 1.30 11/01/2011   BUN 18 11/01/2011   NA 143 11/01/2011   K 3.7 11/01/2011   CL 107 11/01/2011   CO2 26 11/01/2011    Lab Results  Component Value Date   CKTOTAL 53 11/02/2011   CKMB 2.4 11/02/2011   TROPONINI <0.30 11/02/2011    Current Medications    . aspirin      . aspirin      . aspirin  324 mg Oral Once  . aspirin EC  325 mg Oral Daily  . heparin      . heparin      . heparin  4,000 Units Intravenous Once  . lidocaine      . metoprolol succinate  50 mg Oral Daily  . midazolam      . morphine      .  morphine injection  8 mg Intravenous Once  . mulitivitamin with minerals  1 tablet Oral Daily  . nitroGLYCERIN      . omega-3 acid ethyl esters  1 g Oral Daily  . pantoprazole  40 mg Oral Q1200  . prasugrel  60 mg Oral Once   And  . prasugrel  10 mg Oral Daily  . rosuvastatin  40 mg Oral Daily  . DISCONTD: aspirin  324 mg Oral Pre-Cath  . DISCONTD: aspirin EC  325 mg Oral Daily  . DISCONTD: aspirin  81 mg Oral Daily  . DISCONTD: fish oil-omega-3 fatty  acids  1 g Oral Daily  . DISCONTD: multivitamin  1 tablet Oral Daily  . DISCONTD: prasugrel  10 mg Oral Daily  . DISCONTD: sodium chloride  3 mL Intravenous Q12H     Assessment:  1. Unstable angina, cardiac markers normal.  2. Multivessel CAD status post CABG 2001. All grafts occluded including arterial grafts. Nonobstructive disease in proximal LAD, occluded mid circumflex, an occluded RCA with extensive left-to-right collaterals. Medical therapy is planned. No LV-gram.  3. Hypertension.  4. Hyperlipidemia, on statin therapy.  Plan:  If followup lab work is stable, plan discharge home today. He is now on DAPT with aspirin and Effient. Otherwise continue current medications with addition of Imdur 30 mg daily. He will need followup with Dr. Eden Emms in the next 2 weeks. I also recommended that he take his PPI on a daily basis as he has been reporting some increased reflux symptoms. Followup [facility] for his office visit.   Jonelle Sidle, M.D.,  F.A.C.C.

## 2011-11-04 ENCOUNTER — Telehealth: Payer: Self-pay | Admitting: Cardiovascular Disease

## 2011-11-04 NOTE — Telephone Encounter (Signed)
D/C imdur  No substitute

## 2011-11-04 NOTE — Telephone Encounter (Signed)
New Msg: Pt wife calling stating that pt is c/o severe headache every time pt takes IMDUR 30mg . Pt wife like to know if possibly there is a substitution.   Please return pt call to discuss further.

## 2011-11-04 NOTE — Telephone Encounter (Signed)
11/04/11--pt 's wife

## 2011-11-04 NOTE — Telephone Encounter (Signed)
11/04/11--pt's wife calling stating Curtis Galloway cannot take his imdur any longer as it gives him severe H/A--he has even tried to take tylenol 1/2 hour before imdur, and that does not help--advised to stop imdur until dr Eden Emms is notified--pt's wife would like call back to either d/c or prescribe something else besides imdur---thanks  nt

## 2011-11-05 ENCOUNTER — Telehealth: Payer: Self-pay | Admitting: *Deleted

## 2011-11-05 NOTE — Telephone Encounter (Signed)
11/05/11--called pt's wife and LM that Curtis Galloway may d/c imdur and no replacement med is needed per dr Hildred Laser

## 2011-11-05 NOTE — Telephone Encounter (Signed)
PT'S WIFE AWARE OKAY TO DISCONTINUE IMDUR  BUT NO SUBSTITUTE./CY

## 2011-11-11 ENCOUNTER — Ambulatory Visit (INDEPENDENT_AMBULATORY_CARE_PROVIDER_SITE_OTHER): Payer: Medicare Other | Admitting: Internal Medicine

## 2011-11-11 ENCOUNTER — Encounter: Payer: Self-pay | Admitting: Internal Medicine

## 2011-11-11 VITALS — BP 134/78 | HR 71 | Temp 97.9°F | Resp 12 | Ht 70.0 in | Wt 175.5 lb

## 2011-11-11 DIAGNOSIS — Z23 Encounter for immunization: Secondary | ICD-10-CM

## 2011-11-11 DIAGNOSIS — D649 Anemia, unspecified: Secondary | ICD-10-CM

## 2011-11-11 DIAGNOSIS — Z79899 Other long term (current) drug therapy: Secondary | ICD-10-CM

## 2011-11-11 DIAGNOSIS — R7309 Other abnormal glucose: Secondary | ICD-10-CM

## 2011-11-11 DIAGNOSIS — R739 Hyperglycemia, unspecified: Secondary | ICD-10-CM

## 2011-11-11 DIAGNOSIS — K921 Melena: Secondary | ICD-10-CM

## 2011-11-11 DIAGNOSIS — Z0000 Encounter for general adult medical examination without abnormal findings: Secondary | ICD-10-CM

## 2011-11-11 DIAGNOSIS — Z833 Family history of diabetes mellitus: Secondary | ICD-10-CM

## 2011-11-11 LAB — CBC WITH DIFFERENTIAL/PLATELET
Eosinophils Relative: 1.2 % (ref 0.0–5.0)
HCT: 42.7 % (ref 39.0–52.0)
Hemoglobin: 14.8 g/dL (ref 13.0–17.0)
Lymphs Abs: 2.2 10*3/uL (ref 0.7–4.0)
Monocytes Relative: 8.7 % (ref 3.0–12.0)
Neutro Abs: 6.2 10*3/uL (ref 1.4–7.7)
RBC: 4.48 Mil/uL (ref 4.22–5.81)
WBC: 9.4 10*3/uL (ref 4.5–10.5)

## 2011-11-11 LAB — HEMOGLOBIN A1C: Hgb A1c MFr Bld: 6.1 % (ref 4.6–6.5)

## 2011-11-11 LAB — VITAMIN B12: Vitamin B-12: 554 pg/mL (ref 211–911)

## 2011-11-11 NOTE — Patient Instructions (Signed)
Preventive Health Care: Exercise at least 30-45 minutes a day,  3-4 days a week.  Eat a low-fat diet with lots of fruits and vegetables, up to 7-9 servings per day. Consume less than 40 grams of sugar per day from foods & drinks with High Fructose Corn Sugar as # 1,2,3 or # 4 on label. Please complete stool cards 

## 2011-11-11 NOTE — Assessment & Plan Note (Signed)
Medications as per cardiology; LDL goal  less than 70

## 2011-11-11 NOTE — Progress Notes (Signed)
Subjective:    Patient ID: Curtis Galloway, male    DOB: 16-Dec-1940, 71 y.o.   MRN: [id number]  HPI Medicare Wellness Visit:  The following psychosocial & medical history were reviewed as required by Medicare.   Social history: caffeine: 1 pot / day , alcohol: no ,  tobacco use : quit 1998 & exercise : walking 5X/ week < 30 min.   Home & personal  safety / fall risk: no issues, activities of daily living: no limitations , seatbelt use : yes , and smoke alarm employment : yes .  Power of Attorney/Living Will status : inplace  Vision ( as recorded per Nurse) & Hearing  evaluation : see exam. Orientation :orientated X 3 , memory & recall :good,  math testing: good,and mood & affect : normal . Depression / anxiety: denied Travel history : [place] , immunization status up to date , transfusion history: with CBAG, and preventive health surveillance ( colonoscopies, BMD , etc as per protocol/ [facility]): colonoscopy up to date, Dental care:  dentues . Chart reviewed &  Updated. Active issues reviewed & addressed.       Review of Systems  He is cardiac catheterization 11/01/11 was reviewed. Labs done at that time revealed a hemoglobin of 12.5 and hematocrit of 36.5. He denies significant dyspepsia, dysphagia, abdominal pain, unexplained weight loss, melena or rectal bleeding.  Additionally his glucose was 100. He denies polydipsia, polyphagia, or polyphasia. He also denies paresthesias in his extremities. FH DM in mother     Objective:   Physical Exam Gen.: Healthy and well-nourished in appearance. Alert, appropriate and cooperative throughout exam. Head: Normocephalic without obvious abnormalities;pattern alopecia  Eyes: No corneal or conjunctival inflammation noted. Pupils equal round reactive to light and accommodation.  Extraocular motion intact. Vision grossly normal with lenses. Minor pterygium ,OS > OD. Ears: External  ear exam reveals no significant lesions or deformities. Canals clear .TMs  normal. Hearing is grossly normal bilaterally. Nose: External nasal exam reveals no deformity or inflammation. Nasal mucosa are pink and moist. No lesions or exudates noted.   Mouth: Oral mucosa and oropharynx reveal no lesions or exudates. Dentures. Neck: No deformities, masses, or tenderness noted. Range of motion normal. Thyroid substernal. Lungs: Normal respiratory effort; chest expands symmetrically. Lungs are clear to auscultation without rales, wheezes, or increased work of breathing. Heart: Normal rate and rhythm. Normal S1 and S2. No gallop, click, or rub.No murmur. Abdomen: Bowel sounds normal; abdomen soft and nontender. No masses, organomegaly or hernias noted. Genitalia:Dr Darvin Neighbours .                                                                                   Musculoskeletal/extremities: No deformity or scoliosis noted of  the thoracic or lumbar spine. No  cyanosis, edema, or deformity noted.Minor clubbing. Range of motion  normal .Tone & strength  normal.Joints normal. Deformed toenails. Vascular: Carotid, radial artery, dorsalis pedis and  posterior tibial pulses are full and equal. No bruits present. Neurologic: Alert and oriented x3. Deep tendon reflexes symmetrical and normal.          Skin: Intact without suspicious lesions or rashes.Feet dry Lymph: No  cervical, axillary lymphadenopathy present. Psych: Mood and affect are normal. Normally interactive                                                                                         Assessment & Plan:  #1 Medicare Wellness Exam; criteria met ; data entered #2 Problem List reviewed  #3 mild anemia  #4 hyperglycemia; family history diabetes (mother) Plan: see Orders

## 2011-11-22 ENCOUNTER — Other Ambulatory Visit: Payer: Medicare Other

## 2011-11-22 NOTE — Progress Notes (Signed)
Addended by: Valia Wingard D on: 11/22/2011 03:28 PM   Modules accepted: Orders  

## 2011-11-25 ENCOUNTER — Ambulatory Visit (INDEPENDENT_AMBULATORY_CARE_PROVIDER_SITE_OTHER): Payer: Medicare Other | Admitting: Cardiovascular Disease

## 2011-11-25 ENCOUNTER — Encounter: Payer: Self-pay | Admitting: Cardiovascular Disease

## 2011-11-25 DIAGNOSIS — I2510 Atherosclerotic heart disease of native coronary artery without angina pectoris: Secondary | ICD-10-CM

## 2011-11-25 DIAGNOSIS — E785 Hyperlipidemia, unspecified: Secondary | ICD-10-CM

## 2011-11-25 DIAGNOSIS — I10 Essential (primary) hypertension: Secondary | ICD-10-CM

## 2011-11-25 MED ORDER — CLOPIDOGREL BISULFATE 75 MG PO TABS: 75.0000 mg | ORAL_TABLET | Freq: Every day | ORAL | Status: DC

## 2011-11-25 NOTE — Assessment & Plan Note (Signed)
Stable with no angina and good activity level.  Continue medical Rx.  Intolerant to imdur.  Change to generic Plavix as Effient expensive

## 2011-11-25 NOTE — Assessment & Plan Note (Signed)
Cholesterol is at goal.  Continue current dose of statin and diet Rx.  No myalgias or side effects.  F/U  LFT's in 6 months. Lab Results  Component Value Date   LDLCALC 72 11/02/2011

## 2011-11-25 NOTE — Assessment & Plan Note (Signed)
Well controlled.  Continue current medications and low sodium Dash type diet.   HR up told him to take beta blocker in am not pm

## 2011-11-25 NOTE — Progress Notes (Signed)
Patient ID: Curtis Galloway, male   DOB: [birth year], 71 y.o.   MRN: [id number] 71 yo recently hospitalized for SSCP.  Cath showed occluded grafts but suitable for medical Rx  Right dominant with no anomalies  LM: 30%   LAD: 50-60% proximal LAD just after a small diffusely diseased D1. Mid vessel 30% disease. Distal vessel is tented from LIMA with minor disease D2 100% IM: 40-50% medium size vessel  Circumflex: 40% mid. Large OM1 30-40% multiple discrete lesions. AV circ occluded  RCA: 100% ostial occlusion with only filling of RV/SA branch. Occluded proximal stents. Extensive Left to right collaterals to RCA  Grafts:  SVG: to PDA occluded New since 2003  SVG: Diagonal occluded New since 2003  Free Rima: Came off diagonal graft atretic in 2003  Lima: Known to be occluded cath 2003  Hemodynamics:  Aortic Pressure: 134 LV Pressure: 135 18 mmHg  Impression: Despite all his grafts being occluded circulation appears stable for medical Rx. Proximal LAD similar to 2003. Collaterals to RCA  Add Effient and nitrates. D/C in am for outpatient F/U Enzymes negative so far and no acute ECG changes. Check Cr in am  D/C with Effient and nitrates.   Severe headache with imdur stopped.  No angina, dyspnea or palpitaitons  ROS: Denies fever, malais, weight loss, blurry vision, decreased visual acuity, cough, sputum, SOB, hemoptysis, pleuritic pain, palpitaitons, heartburn, abdominal pain, melena, lower extremity edema, claudication, or rash.  All other systems reviewed and negative  General: Affect appropriate Healthy:  appears stated age HEENT: normal Neck supple with no adenopathy JVP normal no bruits no thyromegaly Lungs clear with no wheezing and good diaphragmatic motion Heart:  S1/S2 no murmur, no rub, gallop or click PMI normal Abdomen: benighn, BS positve, no tenderness, no AAA no bruit.  No HSM or HJR Distal pulses intact with no bruits No edema Neuro non-focal Skin warm and dry No  muscular weakness   Current Outpatient Prescriptions  Medication Sig Dispense Refill  . aspirin 81 MG tablet Take 81 mg by mouth daily.        Marland Kitchen atorvastatin (LIPITOR) 80 MG tablet Take 1 tablet (80 mg total) by mouth at bedtime.  90 tablet  3  . fish oil-omega-3 fatty acids 1000 MG capsule Take 1 g by mouth daily.        . metoprolol succinate (TOPROL-XL) 50 MG 24 hr tablet Take 1 tablet (50 mg total) by mouth daily.  90 tablet  3  . Multiple Vitamin (MULTIVITAMIN) tablet Take 1 tablet by mouth daily.        Marland Kitchen omeprazole (PRILOSEC) 20 MG capsule Take 1 capsule (20 mg total) by mouth daily. Acid reflux        Allergies  Tramadol and Codeine  Electrocardiogram:  Assessment and Plan    .

## 2011-11-25 NOTE — Patient Instructions (Signed)
Your physician recommends that you schedule a follow-up appointment in: 3 MONTHS WITH DR [facility] Your physician has recommended you make the following change in your medication: STOP EFFIENT  START GENERIC PLAVIX

## 2011-12-02 ENCOUNTER — Encounter: Payer: Self-pay | Admitting: Internal Medicine

## 2011-12-04 ENCOUNTER — Telehealth: Payer: Self-pay | Admitting: Cardiovascular Disease

## 2011-12-04 MED ORDER — CLOPIDOGREL BISULFATE 75 MG PO TABS: 75.0000 mg | ORAL_TABLET | Freq: Every day | ORAL | Status: DC

## 2011-12-04 NOTE — Telephone Encounter (Signed)
Pt needs generic plavix to be sent to [facility], was given samples and has done well

## 2011-12-09 ENCOUNTER — Telehealth: Payer: Self-pay | Admitting: Cardiovascular Disease

## 2011-12-09 NOTE — Telephone Encounter (Signed)
Please return call to patient wife Burna Mortimer [phone]  Patient wife called [facility] and was told they did not have a prescription faxed for patients Plavix. Refill records show Rx processed as of 12-04-11  Summary: Take 1 tablet (75 mg total) by mouth daily., Starting 12/04/2011, Until Thu 12/03/12, No Print Dose, Route, Frequency: 75 mg, Oral, Daily  Start: 12/04/2011  End: 12/03/2012  Ord/Sold: 12/04/2011 (O)  Report  Taking:  Long-term:   Pharmacy: [facility] - [place], [place] - [address]  Med Dose History  Change   Patient Sig: Take 1 tablet (75 mg total) by mouth daily.   Ordered on: 12/04/2011   Authorized by: Wendall Stade   Dispense: 90 tablet   Please return call to [facility] to discuss and advise

## 2011-12-18 ENCOUNTER — Other Ambulatory Visit: Payer: Self-pay | Admitting: *Deleted

## 2011-12-18 MED ORDER — CLOPIDOGREL BISULFATE 75 MG PO TABS: 75.0000 mg | ORAL_TABLET | Freq: Every day | ORAL | Status: DC

## 2012-02-03 ENCOUNTER — Other Ambulatory Visit: Payer: Self-pay | Admitting: Cardiovascular Disease

## 2012-02-03 ENCOUNTER — Encounter: Payer: Self-pay | Admitting: Cardiovascular Disease

## 2012-02-03 ENCOUNTER — Ambulatory Visit (INDEPENDENT_AMBULATORY_CARE_PROVIDER_SITE_OTHER): Payer: Medicare Other | Admitting: Cardiovascular Disease

## 2012-02-03 VITALS — BP 148/81 | HR 67 | Wt 170.0 lb

## 2012-02-03 DIAGNOSIS — I10 Essential (primary) hypertension: Secondary | ICD-10-CM

## 2012-02-03 DIAGNOSIS — R51 Headache: Secondary | ICD-10-CM

## 2012-02-03 DIAGNOSIS — C649 Malignant neoplasm of unspecified kidney, except renal pelvis: Secondary | ICD-10-CM

## 2012-02-03 DIAGNOSIS — I2510 Atherosclerotic heart disease of native coronary artery without angina pectoris: Secondary | ICD-10-CM

## 2012-02-03 DIAGNOSIS — K219 Gastro-esophageal reflux disease without esophagitis: Secondary | ICD-10-CM

## 2012-02-03 LAB — PLATELET INHIBITION P2Y12: Platelet Function  P2Y12: 189 [PRU] — ABNORMAL LOW (ref 194–418)

## 2012-02-03 NOTE — Assessment & Plan Note (Signed)
Change to protonix if P2Y is low. However he indicates he does not get reflux that often and probably could stop it if needed Spicy food like sausage instigates.  symptoms

## 2012-02-03 NOTE — Progress Notes (Signed)
71 yo recently hospitalized for SSCP. Cath showed occluded grafts but suitable for medical Rx  On prilosec for reflux.  Needs P2Y test.  No angina.  Wallks daily.  Intolerant to nitrates with headache Did not want to be on Effient due to cost.  Compliant with meds  Right dominant with no anomalies  Cath 11/01/11 LM: 30%   LAD: 50-60% proximal LAD just after a small diffusely diseased D1. Mid vessel 30% disease. Distal vessel is tented from LIMA with minor disease D2 100% IM: 40-50% medium size vessel  Circumflex: 40% mid. Large OM1 30-40% multiple discrete lesions. AV circ occluded  RCA: 100% ostial occlusion with only filling of RV/SA branch. Occluded proximal stents. Extensive Left to right collaterals to RCA  Grafts:  SVG: to PDA occluded New since 2003  SVG: Diagonal occluded New since 2003  Free Rima: Came off diagonal graft atretic in 2003  Lima: Known to be occluded cath 2003  Hemodynamics:  Aortic Pressure: 134 LV Pressure: 135 18 mmHg  Impression: Despite all his grafts being occluded circulation appears stable for medical Rx. Proximal LAD similar to 2003. Collaterals to RCA  Add Effient and nitrates. D/C in am for outpatient F/U Enzymes negative so far and no acute ECG changes. Check Cr in am  D/C with Effient and nitrates. Severe headache with imdur stopped. No angina, dyspnea or palpitaitons  ROS: Denies fever, malais, weight loss, blurry vision, decreased visual acuity, cough, sputum, SOB, hemoptysis, pleuritic pain, palpitaitons, heartburn, abdominal pain, melena, lower extremity edema, claudication, or rash.  All other systems reviewed and negative  General: Affect appropriate Healthy:  appears stated age HEENT: normal Neck supple with no adenopathy JVP normal no bruits no thyromegaly Lungs clear with no wheezing and good diaphragmatic motion Heart:  S1/S2 no murmur, no rub, gallop or click PMI normal Abdomen: benighn, BS positve, no tenderness, no AAA no  bruit.  No HSM or HJR Distal pulses intact with no bruits No edema Neuro non-focal Skin warm and dry No muscular weakness   Current Outpatient Prescriptions  Medication Sig Dispense Refill  . aspirin 81 MG tablet Take 81 mg by mouth daily.        Marland Kitchen atorvastatin (LIPITOR) 80 MG tablet Take 1 tablet (80 mg total) by mouth at bedtime.  90 tablet  3  . clopidogrel (PLAVIX) 75 MG tablet Take 1 tablet (75 mg total) by mouth daily.  90 tablet  3  . fish oil-omega-3 fatty acids 1000 MG capsule Take 1 g by mouth daily.        . metoprolol succinate (TOPROL-XL) 50 MG 24 hr tablet Take 1 tablet (50 mg total) by mouth daily.  90 tablet  3  . Multiple Vitamin (MULTIVITAMIN) tablet Take 1 tablet by mouth daily.        Marland Kitchen omeprazole (PRILOSEC) 20 MG capsule Take 1 capsule (20 mg total) by mouth daily. Acid reflux        Allergies  Tramadol and Codeine  Electrocardiogram:  Assessment and Plan

## 2012-02-03 NOTE — Assessment & Plan Note (Signed)
Stable with no angina and good activity level.  Continue medical Rx Check P2Y given daily prilosec usage

## 2012-02-03 NOTE — Assessment & Plan Note (Signed)
Cholesterol is at goal.  Continue current dose of statin and diet Rx.  No myalgias or side effects.  F/U  LFT's in 6 months. Lab Results  Component Value Date   LDLCALC 72 11/02/2011

## 2012-02-03 NOTE — Patient Instructions (Signed)
Your physician wants you to follow-up in: 6 MONTHS WITH DR Haywood Filler will receive a reminder letter in the mail two months in advance. If you don't receive a letter, please call our office to schedule the follow-up appointment. Your physician recommends that you continue on your current medications as directed. Please refer to the Current Medication list given to you today. Your physician recommends that you return for lab work in: TODAY AT HOSPITAL  P2Y12 DX V58.69

## 2012-02-03 NOTE — Assessment & Plan Note (Signed)
Well controlled.  Continue current medications and low sodium Dash type diet.    

## 2012-02-03 NOTE — Assessment & Plan Note (Signed)
Resolved off nitrates

## 2012-09-30 HISTORY — PX: OTHER SURGICAL HISTORY: SHX169

## 2012-10-09 ENCOUNTER — Ambulatory Visit: Payer: Medicare Other | Admitting: Cardiovascular Disease

## 2012-10-20 ENCOUNTER — Encounter: Payer: Self-pay | Admitting: Cardiovascular Disease

## 2012-10-20 ENCOUNTER — Ambulatory Visit (INDEPENDENT_AMBULATORY_CARE_PROVIDER_SITE_OTHER): Payer: Medicare Other | Admitting: Cardiovascular Disease

## 2012-10-20 VITALS — BP 152/80 | HR 71 | Ht 69.0 in | Wt 170.0 lb

## 2012-10-20 DIAGNOSIS — I10 Essential (primary) hypertension: Secondary | ICD-10-CM

## 2012-10-20 DIAGNOSIS — Z79899 Other long term (current) drug therapy: Secondary | ICD-10-CM

## 2012-10-20 DIAGNOSIS — Z0000 Encounter for general adult medical examination without abnormal findings: Secondary | ICD-10-CM

## 2012-10-20 DIAGNOSIS — I200 Unstable angina: Secondary | ICD-10-CM

## 2012-10-20 DIAGNOSIS — E785 Hyperlipidemia, unspecified: Secondary | ICD-10-CM

## 2012-10-20 DIAGNOSIS — Z131 Encounter for screening for diabetes mellitus: Secondary | ICD-10-CM

## 2012-10-20 LAB — HEPATIC FUNCTION PANEL
ALT: 19 U/L (ref 0–53)
Albumin: 3.7 g/dL (ref 3.5–5.2)
Total Bilirubin: 0.7 mg/dL (ref 0.3–1.2)

## 2012-10-20 LAB — HEMOGLOBIN A1C: Hgb A1c MFr Bld: 6.2 % (ref 4.6–6.5)

## 2012-10-20 MED ORDER — METOPROLOL SUCCINATE ER 50 MG PO TB24: 50.0000 mg | ORAL_TABLET | Freq: Every day | ORAL | Status: DC

## 2012-10-20 MED ORDER — ATORVASTATIN CALCIUM 80 MG PO TABS: 80.0000 mg | ORAL_TABLET | Freq: Every day | ORAL | Status: DC

## 2012-10-20 MED ORDER — CLOPIDOGREL BISULFATE 75 MG PO TABS: 75.0000 mg | ORAL_TABLET | Freq: Every day | ORAL | Status: DC

## 2012-10-20 NOTE — Progress Notes (Signed)
Patient ID: Curtis Galloway, male   DOB: 08-19-1941, 72 y.o.   MRN: [id number] 71 yo recently hospitalized for SSCP. Cath showed occluded grafts but suitable for medical Rx  On prilosec for reflux. Needs P2Y test. No angina. Wallks daily. Intolerant to nitrates with headache  Did not want to be on Effient due to cost. Compliant with meds   P2Y was 189 so Plavix ok  Right dominant with no anomalies Cath 11/01/11  LM: 30%   LAD: 50-60% proximal LAD just after a small diffusely diseased D1. Mid vessel 30% disease. Distal vessel is tented from LIMA with minor disease D2 100% IM: 40-50% medium size vessel  Circumflex: 40% mid. Large OM1 30-40% multiple discrete lesions. AV circ occluded  RCA: 100% ostial occlusion with only filling of RV/SA branch. Occluded proximal stents. Extensive Left to right collaterals to RCA  Grafts:  SVG: to PDA occluded New since 2003  SVG: Diagonal occluded New since 2003  Free Rima: Came off diagonal graft atretic in 2003  Lima: Known to be occluded cath 2003  Hemodynamics:  Aortic Pressure: 134 LV Pressure: 135 18 mmHg  Impression: Despite all his grafts being occluded circulation appears stable for medical Rx. Proximal LAD similar to 2003. Collaterals to RCA  Add Effient and nitrates. D/C in am for outpatient F/U Enzymes negative so far and no acute ECG changes. Check Cr in am  D/C with Effient and nitrates. Severe headache with imdur stopped. No angina, dyspnea or palpitaitons  Does not see primary regularly  ROS: Denies fever, malais, weight loss, blurry vision, decreased visual acuity, cough, sputum, SOB, hemoptysis, pleuritic pain, palpitaitons, heartburn, abdominal pain, melena, lower extremity edema, claudication, or rash.  All other systems reviewed and negative  General: Affect appropriate Healthy:  appears stated age HEENT: normal Neck supple with no adenopathy JVP normal no bruits no thyromegaly Lungs clear with no wheezing and good  diaphragmatic motion Heart:  S1/S2 no murmur, no rub, gallop or click PMI normal Abdomen: benighn, BS positve, no tenderness, no AAA no bruit.  No HSM or HJR Distal pulses intact with no bruits No edema Neuro non-focal Skin warm and dry No muscular weakness   Current Outpatient Prescriptions  Medication Sig Dispense Refill  . aspirin 81 MG tablet Take 81 mg by mouth daily.        Marland Kitchen atorvastatin (LIPITOR) 80 MG tablet Take 1 tablet (80 mg total) by mouth at bedtime.  90 tablet  3  . clopidogrel (PLAVIX) 75 MG tablet Take 1 tablet (75 mg total) by mouth daily.  90 tablet  3  . fish oil-omega-3 fatty acids 1000 MG capsule Take 2 g by mouth daily.       . metoprolol succinate (TOPROL-XL) 50 MG 24 hr tablet Take 1 tablet (50 mg total) by mouth daily.  90 tablet  3  . Multiple Vitamin (MULTIVITAMIN) tablet Take 1 tablet by mouth daily.        Marland Kitchen omeprazole (PRILOSEC) 20 MG capsule Take 1 capsule (20 mg total) by mouth daily. Acid reflux        Allergies  Tramadol and Codeine  Electrocardiogram:  Assessment and Plan

## 2012-10-20 NOTE — Assessment & Plan Note (Signed)
CAD stable no chest pain and acitve Continue medical Rx

## 2012-10-20 NOTE — Assessment & Plan Note (Signed)
Cholesterol is at goal.  Continue current dose of statin and diet Rx.  No myalgias or side effects.  F/U  LFT's in 6 months. Lab Results  Component Value Date   LDLCALC 72 11/02/2011   Check labs today

## 2012-10-20 NOTE — Patient Instructions (Addendum)
Your physician wants you to follow-up in: YEAR WITH DR Haywood Filler will receive a reminder letter in the mail two months in advance. If you don't receive a letter, please call our office to schedule the follow-up appointment. Your physician recommends that you continue on your current medications as directed. Please refer to the Current Medication list given to you today. Your physician recommends that you return for lab work in: TODAY    HGB A1C LIPID LIVER  DX 272.4  V58.69

## 2012-10-20 NOTE — Assessment & Plan Note (Signed)
Well controlled.  Continue current medications and low sodium Dash type diet.    

## 2012-12-19 ENCOUNTER — Encounter: Payer: Self-pay | Admitting: Family Medicine

## 2012-12-19 ENCOUNTER — Ambulatory Visit (HOSPITAL_COMMUNITY)
Admission: RE | Admit: 2012-12-19 | Discharge: 2012-12-19 | Disposition: A | Discharge status: Home / Self Care | Payer: Medicare Other | Source: Ambulatory Visit | Attending: Family Medicine | Admitting: Family Medicine

## 2012-12-19 ENCOUNTER — Ambulatory Visit (INDEPENDENT_AMBULATORY_CARE_PROVIDER_SITE_OTHER): Payer: Medicare Other | Admitting: Family Medicine

## 2012-12-19 VITALS — BP 152/70 | HR 104 | Temp 100.9°F | Wt 175.5 lb

## 2012-12-19 DIAGNOSIS — J209 Acute bronchitis, unspecified: Secondary | ICD-10-CM | POA: Insufficient documentation

## 2012-12-19 DIAGNOSIS — R05 Cough: Secondary | ICD-10-CM | POA: Insufficient documentation

## 2012-12-19 DIAGNOSIS — R509 Fever, unspecified: Secondary | ICD-10-CM | POA: Insufficient documentation

## 2012-12-19 DIAGNOSIS — R059 Cough, unspecified: Secondary | ICD-10-CM | POA: Insufficient documentation

## 2012-12-19 DIAGNOSIS — R091 Pleurisy: Secondary | ICD-10-CM | POA: Insufficient documentation

## 2012-12-19 DIAGNOSIS — Z951 Presence of aortocoronary bypass graft: Secondary | ICD-10-CM | POA: Insufficient documentation

## 2012-12-19 MED ORDER — AZITHROMYCIN 250 MG PO TABS: ORAL_TABLET | ORAL | Status: DC

## 2012-12-19 MED ORDER — LEVOFLOXACIN 500 MG PO TABS: 500.0000 mg | ORAL_TABLET | Freq: Every day | ORAL | Status: DC

## 2012-12-19 NOTE — Assessment & Plan Note (Addendum)
Of 1d duration however does have deep crackles with decreased breath sounds on lung exam, cannot r/o rales.  O2 sat 93% today.  Also with significant comorbidities. Treat aggressively with levaquin for presumed bronchitis. I have sent him across the street for CXR to eval abnormal lung exam - r/o PNA/pleural effusions. 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 Declines cough med today.  ==> ADDENDUM: xray on my read with some R basilar scarring present but no acute findings suggesting infection.  Will change abx from levaquin to zpack, called wife and notified, called pharmacy and cancelled levo.   To f/u with PCP if not improving as expected.

## 2012-12-19 NOTE — Progress Notes (Signed)
  Subjective:    Patient ID: Curtis Galloway, male    DOB: [birth year], 72 y.o.   MRN: [id number]  HPI CC: cough  Pleasant 72 yo pt of Dr. Alwyn Ren with h/o CAD s/p CABG, HLD, HTN presents today with sudden onset of URI sxs.  1d h/o fever, body aches, coughing, watery eyes, rhinorrhea, HA.  Head congestion > chest congestion.  Run down and generalized malaise.  Some worsened dyspnea from baseline. Tmax 100.2 at home  No abd pain, nausea, ear or tooth pain, ST, chest pain/tightness.  Did get flu shot this year.    No sick contacts at home. No smokers at home.  Former smoker No h/o asthma, COPD  H/o kidney cancer s/p L nephrectomy. 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  Past Medical History  Diagnosis Date  . Hypernephroma   . Coronary artery disease   . GERD (gastroesophageal reflux disease)   . Hyperlipidemia   . Hypertension     Past Surgical History  Procedure Laterality Date  . Appendectomy    . Coronary artery bypass graft  2000    Dr. Dorris Fetch 2001  . Lumbar laminectomy      x 4  . 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 Tonsillectomy    . Cervical laminectomy      x 1  . Colonscopy  2006    Dr. Lina Sar; negative  . Cardiac catheterization  11/01/2011    Dr Eden Emms    Review of Systems Per HPI    Objective:   Physical Exam  Nursing note and vitals reviewed. Constitutional: He appears well-developed and well-nourished. No distress.  HENT:  Head: Normocephalic and atraumatic.  Right Ear: Hearing, tympanic membrane, external ear and ear canal normal.  Left Ear: Hearing, tympanic membrane, external ear and ear canal normal.  Nose: Mucosal edema present. No rhinorrhea. Right sinus exhibits no maxillary sinus tenderness and no frontal sinus tenderness. Left sinus exhibits no maxillary sinus tenderness and no frontal sinus tenderness.  Mouth/Throat: Uvula is midline, oropharynx is clear and moist and mucous membranes are normal. No oropharyngeal exudate,  posterior oropharyngeal edema, posterior oropharyngeal erythema or tonsillar abscesses.  Nasal mucosal erythema and irritation  Eyes: Conjunctivae and EOM are normal. Pupils are equal, round, and reactive to light. No scleral icterus.  Neck: Normal range of motion. Neck supple. No JVD present.  Cardiovascular: Normal rate, regular rhythm, normal heart sounds and intact distal pulses.   No murmur heard. Pulmonary/Chest: Effort normal. No respiratory distress. He has decreased breath sounds. He has no wheezes. He has no rhonchi. He has no rales.  Crackles bibasilarly to mid scapula  Musculoskeletal: He exhibits no edema.  Lymphadenopathy:    He has no cervical adenopathy.  Skin: Skin is warm and dry. No rash noted.       Assessment & Plan:

## 2012-12-19 NOTE — Patient Instructions (Addendum)
I do think you have developed sinusitis - treat with zpack. I am hearing fluid or scarring in lungs - pass by [facility] for xray today. Follow up with Dr. Alwyn Ren this week if not improving May continue corsedin. May use delsym or dimetapp (without decongestant) as needed for cough. May use plain mucinex or immediate release guaifenesin with plenty of water to help mobilize mucous. Call us with questions.

## 2012-12-21 ENCOUNTER — Telehealth: Payer: Self-pay | Admitting: Internal Medicine

## 2012-12-21 NOTE — Telephone Encounter (Signed)
Noted, Hopp aware patient with pending appointment. Per Dr.Hopper's protocol, if patient with pending appointment ok to close encounter

## 2012-12-21 NOTE — Telephone Encounter (Signed)
[facility] Triage Call Report Triage Record Num: [id number] Operator: Caswell Corwin Patient Name: Curtis Galloway Call Date & Time: 12/19/2012 9:47:41AM Patient Phone: [phone] PCP: Marga Melnick Patient Gender: Male PCP Fax : [phone] Patient DOB: [birth year] Practice Name: [facility] Reason for Call: Caller: Alexis Frock; PCP: Marga Melnick; CB#: [phone]; Call regarding Cough/Congestion that started this AM. Triaged Cough Adult and temp is 100.2. He has been taking the Coricidin HBP Cough and Cold. Needs to be seen in 24 hrs for pressure/pain above or below eyes, near ears over sinuses that's worse with bending foward. Appt with Dr, Dion Saucier today, 12/19/12 at 11:30. Call back instructions given. Protocol(s) Used: Cough - Adult Recommended Outcome per Protocol: See Provider within 24 hours Reason for Outcome: Pressure/pain above or below eyes, near ears over sinuses (may also be described as fullness, worsens when bending forward, teeth or eye pain) AND yellow-green drainage from nose or down back of throat. Care Advice: ~

## 2012-12-24 ENCOUNTER — Encounter: Payer: Self-pay | Admitting: Internal Medicine

## 2013-01-27 ENCOUNTER — Other Ambulatory Visit: Payer: Self-pay | Admitting: Orthopedic Surgery

## 2013-01-27 DIAGNOSIS — M545 Low back pain: Secondary | ICD-10-CM

## 2013-02-03 ENCOUNTER — Ambulatory Visit
Admission: RE | Admit: 2013-02-03 | Discharge: 2013-02-03 | Disposition: A | Discharge status: Home / Self Care | Payer: Medicare Other | Source: Ambulatory Visit | Attending: Orthopedic Surgery | Admitting: Orthopedic Surgery

## 2013-02-03 DIAGNOSIS — M545 Low back pain: Secondary | ICD-10-CM

## 2013-02-03 MED ORDER — GADOBENATE DIMEGLUMINE 529 MG/ML IV SOLN
15.0000 mL | Freq: Once | INTRAVENOUS | Status: AC | PRN
  Administered 2013-02-03: 15 mL via INTRAVENOUS

## 2013-02-10 ENCOUNTER — Telehealth: Payer: Self-pay | Admitting: Cardiovascular Disease

## 2013-02-10 NOTE — Telephone Encounter (Deleted)
New Problem:    Called in wanting to know the status of the anticoagulation fax she sent in on Monday.  Please call back.

## 2013-03-08 ENCOUNTER — Other Ambulatory Visit: Payer: Self-pay | Admitting: Urology

## 2013-03-08 DIAGNOSIS — C649 Malignant neoplasm of unspecified kidney, except renal pelvis: Secondary | ICD-10-CM

## 2013-03-11 ENCOUNTER — Ambulatory Visit
Admission: RE | Admit: 2013-03-11 | Discharge: 2013-03-11 | Disposition: A | Discharge status: Home / Self Care | Payer: Medicare Other | Source: Ambulatory Visit | Attending: Urology | Admitting: Urology

## 2013-03-11 ENCOUNTER — Other Ambulatory Visit: Payer: Self-pay | Admitting: Urology

## 2013-03-11 DIAGNOSIS — C649 Malignant neoplasm of unspecified kidney, except renal pelvis: Secondary | ICD-10-CM

## 2013-03-11 MED ORDER — IOHEXOL 300 MG/ML  SOLN
100.0000 mL | Freq: Once | INTRAMUSCULAR | Status: AC | PRN
  Administered 2013-03-11: 100 mL via INTRAVENOUS

## 2013-03-18 ENCOUNTER — Other Ambulatory Visit: Payer: Self-pay | Admitting: Gastroenterology

## 2013-03-29 ENCOUNTER — Telehealth: Payer: Self-pay | Admitting: Cardiovascular Disease

## 2013-03-29 NOTE — Telephone Encounter (Signed)
New problem   Susan/Dr Maurice Small is calling about pt stoping plavix and asprin for a procedure. Please fax [phone].

## 2013-03-29 NOTE — Telephone Encounter (Signed)
AWAITING ON PRE OP CLEARANCE FORM FAXED TODAY  FROM  ORTHO./CY

## 2013-03-30 NOTE — Telephone Encounter (Signed)
PRE OP  FORM  RECEIVED  WILL REVIEW WITH DR NISHAN./CY

## 2013-03-31 ENCOUNTER — Telehealth: Payer: Self-pay | Admitting: Cardiovascular Disease

## 2013-03-31 NOTE — Telephone Encounter (Signed)
Cath 2013 with occluded grafts but suitable for medical Rx  Will need effient stopped 5 days before surgery.  If he is not having any angina can have surgery.  Take beta blockade even morning of surgery

## 2013-03-31 NOTE — Telephone Encounter (Signed)
[facility] refaxed preop clearance form so that it can be reviewed. Routing to Dr. Eden Emms.

## 2013-03-31 NOTE — Telephone Encounter (Signed)
New problem      Waiting on cardiac clearance. Surgery is still pending.

## 2013-03-31 NOTE — Telephone Encounter (Signed)
Called patient - LMTCB. Faxed note from Dr. Eden Emms below to [facility]. Informed them that patient was not available by phone so was unable to assess current status as related to angina.  Routed to [employer], Charity fundraiser, to inform her of Dr. Fabio Bering instructions for patient that will need to be conveyed to him prior to scheduled Ortho procedure.

## 2013-04-01 NOTE — Telephone Encounter (Signed)
New Prob    Needing some clarification on when to do blood work (before or after an injection pt will be having). Please call.

## 2013-04-01 NOTE — Telephone Encounter (Signed)
Wife is aware of instructions. Pt saw Dr. Eden Emms in January & lipid level was not drawn at the time of his other lab work. Pt will come in on 04/09/13 prior to his injection ( as he will have been npo) for lipid level  Mylo Red RN

## 2013-04-06 ENCOUNTER — Encounter: Payer: Self-pay | Admitting: Internal Medicine

## 2013-04-06 DIAGNOSIS — D369 Benign neoplasm, unspecified site: Secondary | ICD-10-CM | POA: Insufficient documentation

## 2013-04-09 ENCOUNTER — Other Ambulatory Visit (INDEPENDENT_AMBULATORY_CARE_PROVIDER_SITE_OTHER): Payer: Medicare Other

## 2013-04-09 DIAGNOSIS — Z79899 Other long term (current) drug therapy: Secondary | ICD-10-CM

## 2013-04-09 LAB — LIPID PANEL
LDL Cholesterol: 71 mg/dL (ref 0–99)
VLDL: 15 mg/dL (ref 0.0–40.0)

## 2013-04-12 NOTE — Telephone Encounter (Signed)
LMTCB ./CY 

## 2013-04-13 ENCOUNTER — Other Ambulatory Visit: Payer: Self-pay | Admitting: *Deleted

## 2013-04-13 NOTE — Telephone Encounter (Signed)
PT'S WIFE AWARE OF LAB RESULTS AND  ALSO PER WIFE PT  HAD INJECTION DONE  AS INDICATED IN PREVIOUS MESSAGE .Zack Seal

## 2013-05-13 ENCOUNTER — Other Ambulatory Visit: Payer: Self-pay | Admitting: Neurosurgery

## 2013-05-13 DIAGNOSIS — M549 Dorsalgia, unspecified: Secondary | ICD-10-CM

## 2013-05-19 ENCOUNTER — Ambulatory Visit
Admission: RE | Admit: 2013-05-19 | Discharge: 2013-05-19 | Disposition: A | Discharge status: Home / Self Care | Payer: Medicare Other | Source: Ambulatory Visit | Attending: Neurosurgery | Admitting: Neurosurgery

## 2013-05-19 VITALS — BP 150/73 | HR 74

## 2013-05-19 DIAGNOSIS — M549 Dorsalgia, unspecified: Secondary | ICD-10-CM

## 2013-05-19 MED ORDER — ONDANSETRON HCL 4 MG/2ML IJ SOLN
4.0000 mg | Freq: Once | INTRAMUSCULAR | Status: AC
  Administered 2013-05-19: 4 mg via INTRAMUSCULAR

## 2013-05-19 MED ORDER — IOHEXOL 180 MG/ML  SOLN
20.0000 mL | Freq: Once | INTRAMUSCULAR | Status: AC | PRN
  Administered 2013-05-19: 20 mL via INTRATHECAL

## 2013-05-19 MED ORDER — DIAZEPAM 5 MG PO TABS
5.0000 mg | ORAL_TABLET | Freq: Once | ORAL | Status: AC
  Administered 2013-05-19: 5 mg via ORAL

## 2013-05-19 MED ORDER — MEPERIDINE HCL 100 MG/ML IJ SOLN
75.0000 mg | Freq: Once | INTRAMUSCULAR | Status: AC
  Administered 2013-05-19: 75 mg via INTRAMUSCULAR

## 2013-05-19 NOTE — Progress Notes (Signed)
Pt states he has been off plavix for the last 5 days. Discharge instructions explained to pt and his wife.

## 2013-06-01 ENCOUNTER — Other Ambulatory Visit: Payer: Self-pay | Admitting: Neurosurgery

## 2013-06-07 ENCOUNTER — Encounter (HOSPITAL_COMMUNITY)
Admission: RE | Admit: 2013-06-07 | Discharge: 2013-06-07 | Disposition: A | Discharge status: Home / Self Care | Payer: Medicare Other | Source: Ambulatory Visit | Attending: Neurosurgery | Admitting: Neurosurgery

## 2013-06-07 ENCOUNTER — Encounter (HOSPITAL_COMMUNITY): Payer: Self-pay

## 2013-06-07 LAB — BASIC METABOLIC PANEL WITH GFR
BUN: 12 mg/dL (ref 6–23)
CO2: 26 meq/L (ref 19–32)
Calcium: 9.2 mg/dL (ref 8.4–10.5)
Chloride: 103 meq/L (ref 96–112)
Creatinine, Ser: 1.2 mg/dL (ref 0.50–1.35)
GFR calc Af Amer: 69 mL/min — ABNORMAL LOW
GFR calc non Af Amer: 60 mL/min — ABNORMAL LOW
Glucose, Bld: 109 mg/dL — ABNORMAL HIGH (ref 70–99)
Potassium: 3.8 meq/L (ref 3.5–5.1)
Sodium: 138 meq/L (ref 135–145)

## 2013-06-07 LAB — CBC
HCT: 40 % (ref 39.0–52.0)
Hemoglobin: 13.6 g/dL (ref 13.0–17.0)
MCH: 30.8 pg (ref 26.0–34.0)
MCHC: 34 g/dL (ref 30.0–36.0)
MCV: 90.7 fL (ref 78.0–100.0)
Platelets: 246 K/uL (ref 150–400)
RBC: 4.41 MIL/uL (ref 4.22–5.81)
RDW: 13 % (ref 11.5–15.5)
WBC: 8.6 K/uL (ref 4.0–10.5)

## 2013-06-07 LAB — TYPE AND SCREEN
ABO/RH(D): A POS
Antibody Screen: NEGATIVE

## 2013-06-07 LAB — ABO/RH: ABO/RH(D): A POS

## 2013-06-07 LAB — SURGICAL PCR SCREEN
MRSA, PCR: NEGATIVE
Staphylococcus aureus: NEGATIVE

## 2013-06-07 NOTE — Progress Notes (Signed)
Anesthesia Chart Review:  Patient is a 72 year old male scheduled for L3-4, L4-5 PLIF on 06/08/13 by Dr. Jeral Fruit.  PAT appointment was on 06/07/13.  History includes former smoker, HTN, GERD, HLD, CAD s/p CABG '00, left nephrectomy for cancer '04, cervical and lumbar laminectomies.  Cardiologist is Dr. Eden Emms.  In July of this year, Dr. Eden Emms was contracted regarding an orthopedic surgical procedure.  At that time, he felt that as long as patient was not having angina then he could have surgery.  (See telephone encounter from 03/31/13.)  PAT RN reports patient continues to deny chest pain today.  Cardiac cath on 11/01/11 showed: Coronary Arteries: Right dominant with no anomalies. LM: 30%. LAD: 50-60% proximal LAD just after a small diffusely diseased D1. Mid vessel 30% disease. Distal vessel is tented from LIMA with minor disease D2 100%. IM: 40-50% medium size vessel. Circumflex: 40% mid. Large OM1 30-40% multiple discrete lesions. AV circ occluded. RCA: 100% ostial occlusion with only filling of RV/SA branch. Occluded proximal stents. Extensive Left to right collaterals to RCA Grafts: SVG: to PDA occluded New since 2003. SVG: Diagonal occluded New since 2003. Free Rima: Came off diagonal graft atretic in 2003. LIMA: Known to be occluded cath 2003.  Medical therapy recommended.   EKG on 06/07/13 showed SR with occasional PVC, non-specific ST abnormality.  CXR on 12/19/12 showed no acute disease post CABG.  Preoperative labs noted.  Patient had a cardiac cath within two years with continued medical therapy recommended.  He will be evaluated by his assigned anesthesiologist on the day of surgery, and if no new CV symptoms then I would anticipate that he could proceed as planned.  Velna Ochs [facility] Short Stay Center/Anesthesiology Phone [phone] 06/07/2013 5:00 PM

## 2013-06-07 NOTE — Pre-Procedure Instructions (Signed)
Curtis Galloway  06/07/2013   Your procedure is scheduled on:  September 9  Report to [facility] Entrance "A" [address] at 09:00 AM.  Call this number if you have problems the morning of surgery: [phone]   Remember:   Do not eat food or drink liquids after midnight.   Take these medicines the morning of surgery with A SIP OF WATER: Metoprolol, Omeprazole   Do not wear jewelry, make-up or nail polish.  Do not wear lotions, powders, or perfumes. You may wear deodorant.  Do not shave 48 hours prior to surgery. Men may shave face and neck.  Do not bring valuables to the hospital.  [facility] is not responsible                   for any belongings or valuables.  Contacts, dentures or bridgework may not be worn into surgery.  Leave suitcase in the car. After surgery it may be brought to your room.  For patients admitted to the hospital, checkout time is 11:00 AM the day of  discharge.   Special Instructions: Use CHG tonight and in the morning before surgery   Please read over the following fact sheets that you were given: Pain Booklet, Coughing and Deep Breathing, Blood Transfusion Information and Surgical Site Infection Prevention

## 2013-06-07 NOTE — Progress Notes (Signed)
Discussed cardiac history with Revonda Standard, [place] patient has clearance from 7/14 from cardiology in epic

## 2013-06-07 NOTE — Progress Notes (Signed)
Left message with Shanda Bumps requesting that Dr. Jeral Fruit sign orders

## 2013-06-08 ENCOUNTER — Encounter (HOSPITAL_COMMUNITY)
Admission: RE | Disposition: A | Discharge status: Home / Self Care | Payer: Self-pay | Source: Ambulatory Visit | Attending: Neurosurgery

## 2013-06-08 ENCOUNTER — Encounter (HOSPITAL_COMMUNITY): Payer: Self-pay | Admitting: *Deleted

## 2013-06-08 ENCOUNTER — Inpatient Hospital Stay (HOSPITAL_COMMUNITY): Payer: Medicare Other | Admitting: Anesthesiology

## 2013-06-08 ENCOUNTER — Encounter (HOSPITAL_COMMUNITY): Payer: Self-pay | Admitting: Vascular Surgery

## 2013-06-08 ENCOUNTER — Inpatient Hospital Stay (HOSPITAL_COMMUNITY)
Admission: RE | Admit: 2013-06-08 | Discharge: 2013-06-14 | DRG: 460 | Disposition: A | Discharge status: Home / Self Care | Payer: Medicare Other | Source: Ambulatory Visit | Attending: Neurosurgery | Admitting: Neurosurgery

## 2013-06-08 ENCOUNTER — Inpatient Hospital Stay (HOSPITAL_COMMUNITY): Payer: Medicare Other

## 2013-06-08 DIAGNOSIS — G96198 Other disorders of meninges, not elsewhere classified: Secondary | ICD-10-CM | POA: Diagnosis present

## 2013-06-08 DIAGNOSIS — I2510 Atherosclerotic heart disease of native coronary artery without angina pectoris: Secondary | ICD-10-CM | POA: Diagnosis present

## 2013-06-08 DIAGNOSIS — Z981 Arthrodesis status: Secondary | ICD-10-CM

## 2013-06-08 DIAGNOSIS — M51379 Other intervertebral disc degeneration, lumbosacral region without mention of lumbar back pain or lower extremity pain: Principal | ICD-10-CM | POA: Diagnosis present

## 2013-06-08 DIAGNOSIS — Z87891 Personal history of nicotine dependence: Secondary | ICD-10-CM

## 2013-06-08 DIAGNOSIS — M5137 Other intervertebral disc degeneration, lumbosacral region: Principal | ICD-10-CM | POA: Diagnosis present

## 2013-06-08 DIAGNOSIS — I10 Essential (primary) hypertension: Secondary | ICD-10-CM | POA: Diagnosis present

## 2013-06-08 DIAGNOSIS — K219 Gastro-esophageal reflux disease without esophagitis: Secondary | ICD-10-CM | POA: Diagnosis present

## 2013-06-08 DIAGNOSIS — E785 Hyperlipidemia, unspecified: Secondary | ICD-10-CM | POA: Diagnosis present

## 2013-06-08 DIAGNOSIS — Z01812 Encounter for preprocedural laboratory examination: Secondary | ICD-10-CM

## 2013-06-08 DIAGNOSIS — Z951 Presence of aortocoronary bypass graft: Secondary | ICD-10-CM

## 2013-06-08 HISTORY — PX: LUMBAR FUSION: SHX111

## 2013-06-08 SURGERY — POSTERIOR LUMBAR FUSION 2 LEVEL
Anesthesia: General | Wound class: Clean

## 2013-06-08 MED ORDER — OXYCODONE HCL 5 MG PO TABS
5.0000 mg | ORAL_TABLET | Freq: Once | ORAL | Status: AC | PRN
  Administered 2013-06-08: 5 mg via ORAL

## 2013-06-08 MED ORDER — CEFAZOLIN SODIUM 1-5 GM-% IV SOLN
INTRAVENOUS | Status: AC
  Administered 2013-06-08: 2 g via INTRAVENOUS
  Filled 2013-06-08: qty 100

## 2013-06-08 MED ORDER — ONDANSETRON HCL 4 MG/2ML IJ SOLN: 4.0000 mg | Freq: Four times a day (QID) | INTRAMUSCULAR | Status: DC | PRN

## 2013-06-08 MED ORDER — ACETAMINOPHEN 650 MG RE SUPP: 650.0000 mg | RECTAL | Status: DC | PRN

## 2013-06-08 MED ORDER — OXYCODONE HCL 5 MG/5ML PO SOLN: 5.0000 mg | Freq: Once | ORAL | Status: AC | PRN

## 2013-06-08 MED ORDER — ASPIRIN 81 MG PO CHEW
81.0000 mg | CHEWABLE_TABLET | Freq: Every day | ORAL | Status: DC
  Administered 2013-06-08 – 2013-06-14 (×7): 81 mg via ORAL
  Filled 2013-06-08 (×7): qty 1

## 2013-06-08 MED ORDER — ACETAMINOPHEN 325 MG PO TABS
650.0000 mg | ORAL_TABLET | ORAL | Status: DC | PRN
  Administered 2013-06-09 (×2): 650 mg via ORAL
  Filled 2013-06-08 (×2): qty 2

## 2013-06-08 MED ORDER — PROPOFOL 10 MG/ML IV BOLUS
INTRAVENOUS | Status: DC | PRN
  Administered 2013-06-08: 150 mg via INTRAVENOUS

## 2013-06-08 MED ORDER — OXYCODONE-ACETAMINOPHEN 5-325 MG PO TABS
1.0000 | ORAL_TABLET | ORAL | Status: DC | PRN
  Administered 2013-06-08 – 2013-06-09 (×2): 2 via ORAL
  Administered 2013-06-09: 1 via ORAL
  Administered 2013-06-10 – 2013-06-12 (×8): 2 via ORAL
  Administered 2013-06-13 (×2): 1 via ORAL
  Administered 2013-06-13 – 2013-06-14 (×5): 2 via ORAL
  Filled 2013-06-08 (×11): qty 2
  Filled 2013-06-08 (×2): qty 1
  Filled 2013-06-08 (×3): qty 2
  Filled 2013-06-08: qty 1
  Filled 2013-06-08: qty 2

## 2013-06-08 MED ORDER — CEFAZOLIN SODIUM 1-5 GM-% IV SOLN
1.0000 g | Freq: Three times a day (TID) | INTRAVENOUS | Status: AC
  Administered 2013-06-08 – 2013-06-09 (×2): 1 g via INTRAVENOUS
  Filled 2013-06-08 (×2): qty 50

## 2013-06-08 MED ORDER — THROMBIN 20000 UNITS EX SOLR
CUTANEOUS | Status: DC | PRN
  Administered 2013-06-08: 13:00:00 via TOPICAL

## 2013-06-08 MED ORDER — NEOSTIGMINE METHYLSULFATE 1 MG/ML IJ SOLN
INTRAMUSCULAR | Status: DC | PRN
  Administered 2013-06-08: 2 mg via INTRAVENOUS

## 2013-06-08 MED ORDER — 0.9 % SODIUM CHLORIDE (POUR BTL) OPTIME
TOPICAL | Status: DC | PRN
  Administered 2013-06-08: 1000 mL

## 2013-06-08 MED ORDER — OXYCODONE HCL 5 MG PO TABS
ORAL_TABLET | ORAL | Status: AC
  Filled 2013-06-08: qty 1

## 2013-06-08 MED ORDER — ROCURONIUM BROMIDE 100 MG/10ML IV SOLN
INTRAVENOUS | Status: DC | PRN
  Administered 2013-06-08: 50 mg via INTRAVENOUS
  Administered 2013-06-08: 20 mg via INTRAVENOUS

## 2013-06-08 MED ORDER — HYDROMORPHONE HCL PF 1 MG/ML IJ SOLN
0.2500 mg | INTRAMUSCULAR | Status: DC | PRN
  Administered 2013-06-08 (×2): 0.5 mg via INTRAVENOUS

## 2013-06-08 MED ORDER — SODIUM CHLORIDE 0.9 % IJ SOLN
3.0000 mL | Freq: Two times a day (BID) | INTRAMUSCULAR | Status: DC
  Administered 2013-06-08 – 2013-06-13 (×8): 3 mL via INTRAVENOUS

## 2013-06-08 MED ORDER — MIDAZOLAM HCL 5 MG/5ML IJ SOLN
INTRAMUSCULAR | Status: DC | PRN
  Administered 2013-06-08: 2 mg via INTRAVENOUS

## 2013-06-08 MED ORDER — LACTATED RINGERS IV SOLN
INTRAVENOUS | Status: DC | PRN
  Administered 2013-06-08 (×3): via INTRAVENOUS

## 2013-06-08 MED ORDER — HYDROMORPHONE HCL PF 1 MG/ML IJ SOLN
INTRAMUSCULAR | Status: AC
  Filled 2013-06-08: qty 1

## 2013-06-08 MED ORDER — METOCLOPRAMIDE HCL 5 MG/ML IJ SOLN
INTRAMUSCULAR | Status: AC
  Administered 2013-06-08: 10 mg
  Filled 2013-06-08: qty 2

## 2013-06-08 MED ORDER — ARTIFICIAL TEARS OP OINT
TOPICAL_OINTMENT | OPHTHALMIC | Status: DC | PRN
  Administered 2013-06-08: 1 via OPHTHALMIC

## 2013-06-08 MED ORDER — SENNOSIDES-DOCUSATE SODIUM 8.6-50 MG PO TABS: 1.0000 | ORAL_TABLET | Freq: Every evening | ORAL | Status: DC | PRN

## 2013-06-08 MED ORDER — LIDOCAINE HCL (CARDIAC) 20 MG/ML IV SOLN
INTRAVENOUS | Status: DC | PRN
  Administered 2013-06-08: 100 mg via INTRAVENOUS

## 2013-06-08 MED ORDER — METOPROLOL SUCCINATE ER 50 MG PO TB24
50.0000 mg | ORAL_TABLET | Freq: Every day | ORAL | Status: DC
  Administered 2013-06-09 – 2013-06-14 (×6): 50 mg via ORAL
  Filled 2013-06-08 (×6): qty 1

## 2013-06-08 MED ORDER — OXYCODONE-ACETAMINOPHEN 5-325 MG PO TABS
ORAL_TABLET | ORAL | Status: AC
  Filled 2013-06-08: qty 2

## 2013-06-08 MED ORDER — SODIUM CHLORIDE 0.9 % IV SOLN: 250.0000 mL | INTRAVENOUS | Status: DC

## 2013-06-08 MED ORDER — GLYCOPYRROLATE 0.2 MG/ML IJ SOLN
INTRAMUSCULAR | Status: DC | PRN
  Administered 2013-06-08: 0.2 mg via INTRAVENOUS

## 2013-06-08 MED ORDER — DIPHENHYDRAMINE HCL 50 MG/ML IJ SOLN: 12.5000 mg | Freq: Four times a day (QID) | INTRAMUSCULAR | Status: DC | PRN

## 2013-06-08 MED ORDER — SODIUM CHLORIDE 0.9 % IV SOLN
INTRAVENOUS | Status: DC
  Administered 2013-06-09 – 2013-06-12 (×3): via INTRAVENOUS

## 2013-06-08 MED ORDER — MORPHINE SULFATE (PF) 1 MG/ML IV SOLN
INTRAVENOUS | Status: DC
  Administered 2013-06-08: 2 mg via INTRAVENOUS
  Administered 2013-06-08 – 2013-06-09 (×2): via INTRAVENOUS
  Administered 2013-06-09: 7.5 mg via INTRAVENOUS
  Administered 2013-06-09 – 2013-06-10 (×2): 3 mg via INTRAVENOUS
  Administered 2013-06-10: 4.5 mg via INTRAVENOUS
  Filled 2013-06-08 (×2): qty 25

## 2013-06-08 MED ORDER — FENTANYL CITRATE 0.05 MG/ML IJ SOLN
INTRAMUSCULAR | Status: DC | PRN
  Administered 2013-06-08: 50 ug via INTRAVENOUS
  Administered 2013-06-08 (×2): 100 ug via INTRAVENOUS

## 2013-06-08 MED ORDER — DIAZEPAM 5 MG PO TABS
5.0000 mg | ORAL_TABLET | Freq: Four times a day (QID) | ORAL | Status: DC | PRN
  Administered 2013-06-08 – 2013-06-11 (×2): 5 mg via ORAL
  Filled 2013-06-08: qty 1

## 2013-06-08 MED ORDER — LACTATED RINGERS IV SOLN
INTRAVENOUS | Status: DC
  Administered 2013-06-08: 10:00:00 via INTRAVENOUS

## 2013-06-08 MED ORDER — METOCLOPRAMIDE HCL 5 MG/ML IJ SOLN: 10.0000 mg | Freq: Once | INTRAMUSCULAR | Status: DC | PRN

## 2013-06-08 MED ORDER — PHENYLEPHRINE HCL 10 MG/ML IJ SOLN
INTRAMUSCULAR | Status: DC | PRN
  Administered 2013-06-08 (×4): 80 ug via INTRAVENOUS

## 2013-06-08 MED ORDER — PHENOL 1.4 % MT LIQD: 1.0000 | OROMUCOSAL | Status: DC | PRN

## 2013-06-08 MED ORDER — NALOXONE HCL 0.4 MG/ML IJ SOLN: 0.4000 mg | INTRAMUSCULAR | Status: DC | PRN

## 2013-06-08 MED ORDER — DIPHENHYDRAMINE HCL 12.5 MG/5ML PO ELIX: 12.5000 mg | ORAL_SOLUTION | Freq: Four times a day (QID) | ORAL | Status: DC | PRN

## 2013-06-08 MED ORDER — MENTHOL 3 MG MT LOZG: 1.0000 | LOZENGE | OROMUCOSAL | Status: DC | PRN

## 2013-06-08 MED ORDER — MUPIROCIN 2 % EX OINT
TOPICAL_OINTMENT | Freq: Two times a day (BID) | CUTANEOUS | Status: DC
  Administered 2013-06-08 – 2013-06-10 (×5): via NASAL
  Administered 2013-06-11: 1 via NASAL
  Administered 2013-06-11 – 2013-06-12 (×3): via NASAL
  Administered 2013-06-13: 1 via NASAL
  Administered 2013-06-13 – 2013-06-14 (×2): via NASAL
  Filled 2013-06-08 (×2): qty 22

## 2013-06-08 MED ORDER — ONDANSETRON HCL 4 MG/2ML IJ SOLN: 4.0000 mg | INTRAMUSCULAR | Status: DC | PRN

## 2013-06-08 MED ORDER — ONDANSETRON HCL 4 MG/2ML IJ SOLN
INTRAMUSCULAR | Status: DC | PRN
  Administered 2013-06-08: 4 mg via INTRAVENOUS

## 2013-06-08 MED ORDER — DIAZEPAM 5 MG PO TABS
ORAL_TABLET | ORAL | Status: AC
  Filled 2013-06-08: qty 1

## 2013-06-08 MED ORDER — SODIUM CHLORIDE 0.9 % IJ SOLN: 3.0000 mL | INTRAMUSCULAR | Status: DC | PRN

## 2013-06-08 MED ORDER — ZOLPIDEM TARTRATE 5 MG PO TABS: 5.0000 mg | ORAL_TABLET | Freq: Every evening | ORAL | Status: DC | PRN

## 2013-06-08 MED ORDER — EPHEDRINE SULFATE 50 MG/ML IJ SOLN
INTRAMUSCULAR | Status: DC | PRN
  Administered 2013-06-08 (×2): 10 mg via INTRAVENOUS

## 2013-06-08 MED ORDER — ATORVASTATIN CALCIUM 80 MG PO TABS
80.0000 mg | ORAL_TABLET | Freq: Every day | ORAL | Status: DC
  Administered 2013-06-08 – 2013-06-13 (×6): 80 mg via ORAL
  Filled 2013-06-08 (×7): qty 1

## 2013-06-08 MED ORDER — SODIUM CHLORIDE 0.9 % IJ SOLN: 9.0000 mL | INTRAMUSCULAR | Status: DC | PRN

## 2013-06-08 SURGICAL SUPPLY — 80 items
APL SKNCLS STERI-STRIP NONHPOA (GAUZE/BANDAGES/DRESSINGS) ×1
BENZOIN TINCTURE PRP APPL 2/3 (GAUZE/BANDAGES/DRESSINGS) ×2 IMPLANT
BLADE SURG ROTATE 9660 (MISCELLANEOUS) IMPLANT
BUR ACORN 6.0 (BURR) ×2 IMPLANT
BUR MATCHSTICK NEURO 3.0 LAGG (BURR) ×2 IMPLANT
CANISTER SUCTION 2500CC (MISCELLANEOUS) ×2 IMPLANT
CAP REVERE LOCKING (Cap) ×6 IMPLANT
CLOTH BEACON ORANGE TIMEOUT ST (SAFETY) ×2 IMPLANT
CONN CROSSLINK REV 6.35 48-60 (Connector) ×2 IMPLANT
CONNECTOR CRSLNK REV6.35 48-60 (Connector) ×1 IMPLANT
CONT SPEC 4OZ CLIKSEAL STRL BL (MISCELLANEOUS) ×4 IMPLANT
COVER BACK TABLE 24X17X13 BIG (DRAPES) IMPLANT
COVER TABLE BACK 60X90 (DRAPES) ×2 IMPLANT
DRAPE C-ARM 42X72 X-RAY (DRAPES) ×4 IMPLANT
DRAPE LAPAROTOMY 100X72X124 (DRAPES) ×2 IMPLANT
DRAPE MICROSCOPE ZEISS OPMI (DRAPES) ×1 IMPLANT
DRAPE POUCH INSTRU U-SHP 10X18 (DRAPES) ×2 IMPLANT
DRSG PAD ABDOMINAL 8X10 ST (GAUZE/BANDAGES/DRESSINGS) IMPLANT
DURAPREP 26ML APPLICATOR (WOUND CARE) ×2 IMPLANT
ELECT REM PT RETURN 9FT ADLT (ELECTROSURGICAL) ×2
ELECTRODE REM PT RTRN 9FT ADLT (ELECTROSURGICAL) ×1 IMPLANT
EVACUATOR 1/8 PVC DRAIN (DRAIN) IMPLANT
GAUZE SPONGE 4X4 16PLY XRAY LF (GAUZE/BANDAGES/DRESSINGS) ×2 IMPLANT
GLOVE BIO SURGEON STRL SZ7.5 (GLOVE) ×1 IMPLANT
GLOVE BIO SURGEON STRL SZ8.5 (GLOVE) ×3 IMPLANT
GLOVE BIOGEL M 8.0 STRL (GLOVE) ×3 IMPLANT
GLOVE BIOGEL PI IND STRL 6.5 (GLOVE) IMPLANT
GLOVE BIOGEL PI IND STRL 8 (GLOVE) ×2 IMPLANT
GLOVE BIOGEL PI INDICATOR 6.5 (GLOVE) ×1
GLOVE BIOGEL PI INDICATOR 8 (GLOVE) ×2
GLOVE ECLIPSE 7.5 STRL STRAW (GLOVE) ×2 IMPLANT
GLOVE EXAM NITRILE LRG STRL (GLOVE) IMPLANT
GLOVE EXAM NITRILE MD LF STRL (GLOVE) IMPLANT
GLOVE EXAM NITRILE XL STR (GLOVE) IMPLANT
GLOVE EXAM NITRILE XS STR PU (GLOVE) IMPLANT
GLOVE SS BIOGEL STRL SZ 6.5 (GLOVE) IMPLANT
GLOVE SS N UNI LF 7.0 STRL (GLOVE) ×1 IMPLANT
GLOVE SUPERSENSE BIOGEL SZ 6.5 (GLOVE) ×3
GOWN BRE IMP SLV AUR LG STRL (GOWN DISPOSABLE) ×2 IMPLANT
GOWN BRE IMP SLV AUR XL STRL (GOWN DISPOSABLE) ×1 IMPLANT
GOWN STRL REIN 2XL LVL4 (GOWN DISPOSABLE) ×1 IMPLANT
KIT BASIN OR (CUSTOM PROCEDURE TRAY) ×2 IMPLANT
KIT INFUSE LRG II (Orthopedic Implant) ×2 IMPLANT
KIT ROOM TURNOVER OR (KITS) ×2 IMPLANT
MILL MEDIUM DISP (BLADE) ×1 IMPLANT
NDL HYPO 21X1.5 SAFETY (NEEDLE) IMPLANT
NDL HYPO 25X1 1.5 SAFETY (NEEDLE) IMPLANT
NEEDLE HYPO 18GX1.5 BLUNT FILL (NEEDLE) IMPLANT
NEEDLE HYPO 21X1.5 SAFETY (NEEDLE) IMPLANT
NEEDLE HYPO 25X1 1.5 SAFETY (NEEDLE) IMPLANT
NS IRRIG 1000ML POUR BTL (IV SOLUTION) ×2 IMPLANT
PACK LAMINECTOMY NEURO (CUSTOM PROCEDURE TRAY) ×2 IMPLANT
PAD ARMBOARD 7.5X6 YLW CONV (MISCELLANEOUS) ×6 IMPLANT
PATTIES SURGICAL .5 X1 (DISPOSABLE) ×2 IMPLANT
PATTIES SURGICAL .5 X3 (DISPOSABLE) IMPLANT
ROD REVERE 7.5MM (Rod) ×4 IMPLANT
RUBBERBAND STERILE (MISCELLANEOUS) ×2 IMPLANT
SCREW REVERE 6.35 5.5X40MM (Screw) ×6 IMPLANT
SPACER SUSTAIN O 10X10X26 (Screw) ×2 IMPLANT
SPACER SUSTAIN O 10X26 12MM (Spacer) ×4 IMPLANT
SPONGE GAUZE 4X4 12PLY (GAUZE/BANDAGES/DRESSINGS) ×2 IMPLANT
SPONGE LAP 4X18 X RAY DECT (DISPOSABLE) IMPLANT
SPONGE NEURO XRAY DETECT 1X3 (DISPOSABLE) IMPLANT
SPONGE SURGIFOAM ABS GEL 100 (HEMOSTASIS) ×2 IMPLANT
STRIP CLOSURE SKIN 1/2X4 (GAUZE/BANDAGES/DRESSINGS) ×2 IMPLANT
SUT ETHILON 2 0 FS 18 (SUTURE) ×1 IMPLANT
SUT PROLENE 5 0 C1 (SUTURE) ×1 IMPLANT
SUT PROLENE 6 0 BV (SUTURE) ×1 IMPLANT
SUT VIC AB 1 CT1 18XBRD ANBCTR (SUTURE) ×2 IMPLANT
SUT VIC AB 1 CT1 8-18 (SUTURE) ×4
SUT VIC AB 2-0 CP2 18 (SUTURE) ×4 IMPLANT
SUT VIC AB 3-0 SH 8-18 (SUTURE) ×2 IMPLANT
SYR 20CC LL (SYRINGE) IMPLANT
SYR 20ML ECCENTRIC (SYRINGE) ×2 IMPLANT
SYR 5ML LL (SYRINGE) IMPLANT
TAPE CLOTH SURG 4X10 WHT LF (GAUZE/BANDAGES/DRESSINGS) ×1 IMPLANT
TOWEL OR 17X24 6PK STRL BLUE (TOWEL DISPOSABLE) ×2 IMPLANT
TOWEL OR 17X26 10 PK STRL BLUE (TOWEL DISPOSABLE) ×2 IMPLANT
TRAY FOLEY CATH 14FRSI W/METER (CATHETERS) ×2 IMPLANT
WATER STERILE IRR 1000ML POUR (IV SOLUTION) ×2 IMPLANT

## 2013-06-08 NOTE — H&P (Signed)
Curtis Galloway is an 72 y.o. male.   Chief Complaint: lbp HPI: patient who in the past had 5 lumbar interventions the last one on 2005. He came to my office with his wife complaining of lumbar pain with radiation to both legs ,no better with conservative treatment. Had a myelo and because of the findings he is been taken to surgery  Past Medical History  Diagnosis Date  . Hypernephroma   . GERD (gastroesophageal reflux disease)   . Hyperlipidemia   . Hypertension   . Coronary artery disease     Dr. Eden Galloway    Past Surgical History  Procedure Laterality Date  . Appendectomy    . Coronary artery bypass graft  2000    Dr. Dorris Galloway 2001  . Lumbar laminectomy      x 4  . 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 Tonsillectomy    . Cervical laminectomy      x 1  . Colonscopy  2006    Dr. Lina Galloway; negative  . Cardiac catheterization  11/01/2011    Dr Curtis Galloway  . Knee surgery      bone moved from hip to under knee cap    Family History  Problem Relation Age of Onset  . Diabetes Mother   . Coronary artery disease Mother   . Heart attack Father 58  . Heart attack Brother 32  . Heart attack Brother     in his 57s   Social History:  reports that he quit smoking about 16 years ago. His smoking use included Cigarettes. He has a 38 pack-year smoking history. He does not have any smokeless tobacco history on file. He reports that he does not drink alcohol or use illicit drugs.  Allergies:  Allergies  Allergen Reactions  . Codeine Nausea Only    severe  . Tramadol Nausea Only    severe    Medications Prior to Admission  Medication Sig Dispense Refill  . aspirin 81 MG tablet Take 81 mg by mouth daily.        Marland Kitchen atorvastatin (LIPITOR) 80 MG tablet Take 1 tablet (80 mg total) by mouth at bedtime.  90 tablet  3  . clopidogrel (PLAVIX) 75 MG tablet Take 1 tablet (75 mg total) by mouth daily.  90 tablet  3  . fish oil-omega-3 fatty acids 1000 MG capsule Take 2 g by mouth daily.        . metoprolol succinate (TOPROL-XL) 50 MG 24 hr tablet Take 1 tablet (50 mg total) by mouth daily.  90 tablet  3  . Multiple Vitamin (MULTIVITAMIN) tablet Take 1 tablet by mouth daily.        Marland Kitchen omeprazole (PRILOSEC) 20 MG capsule Take 20 mg by mouth as needed. Acid reflux        Results for orders placed during the hospital encounter of 06/07/13 (from the past 48 hour(s))  SURGICAL PCR SCREEN     Status: None   Collection Time    06/07/13 10:52 AM      Result Value Range   MRSA, PCR NEGATIVE  NEGATIVE   Staphylococcus aureus NEGATIVE  NEGATIVE   Comment:            The Xpert SA Assay (FDA     approved for NASAL specimens     in patients over 41 years of age),     is one component of     a comprehensive surveillance  program.  Test performance has     been validated by Curtis Galloway for patients greater     than or equal to 50 year old.     It is not intended     to diagnose infection nor to     guide or monitor treatment.  TYPE AND SCREEN     Status: None   Collection Time    06/07/13 10:56 AM      Result Value Range   ABO/RH(D) A POS     Antibody Screen NEG     Sample Expiration 06/21/2013    ABO/RH     Status: None   Collection Time    06/07/13 10:56 AM      Result Value Range   ABO/RH(D) A POS    BASIC METABOLIC PANEL     Status: Abnormal   Collection Time    06/07/13 11:07 AM      Result Value Range   Sodium 138  135 - 145 mEq/L   Potassium 3.8  3.5 - 5.1 mEq/L   Chloride 103  96 - 112 mEq/L   CO2 26  19 - 32 mEq/L   Glucose, Bld 109 (*) 70 - 99 mg/dL   BUN 12  6 - 23 mg/dL   Creatinine, Ser 1.61  0.50 - 1.35 mg/dL   Calcium 9.2  8.4 - 09.6 mg/dL   GFR calc non Af Amer 60 (*) >90 mL/min   GFR calc Af Amer 69 (*) >90 mL/min   Comment: (NOTE)     The eGFR has been calculated using the CKD EPI equation.     This calculation has not been validated in all clinical situations.     eGFR's persistently <90 mL/min signify possible Chronic Kidney      Disease.  CBC     Status: None   Collection Time    06/07/13 11:07 AM      Result Value Range   WBC 8.6  4.0 - 10.5 K/uL   RBC 4.41  4.22 - 5.81 MIL/uL   Hemoglobin 13.6  13.0 - 17.0 g/dL   HCT 04.5  40.9 - 81.1 %   MCV 90.7  78.0 - 100.0 fL   MCH 30.8  26.0 - 34.0 pg   MCHC 34.0  30.0 - 36.0 g/dL   RDW 91.4  78.2 - 95.6 %   Platelets 246  150 - 400 K/uL   No results found.  Review of Systems  Constitutional: Negative.   HENT: Negative.   Respiratory: Negative.   Cardiovascular:       Bypass,cardisac  Gastrointestinal: Negative.   Genitourinary: Negative.   Musculoskeletal: Positive for back pain.  Skin: Negative.   Neurological: Positive for sensory change and focal weakness.  Endo/Heme/Allergies: Negative.   Psychiatric/Behavioral: Negative.     Blood pressure 170/72, pulse 79, temperature 97.6 F (36.4 C), temperature source Oral, resp. rate 20, SpO2 99.00%. Physical Exam hent, nl. Neck, nl cv, midline scar. Lungs, clear. Abdomen, soft. Extremities, scar from venous grafting. NEURO sensation, nl. Strength, nl. SLR  Positive at 80 degrees. Femoral maneuver positive bilaterally. Stenosis at l5s1. At l34,45 he has bilateral facet arthropathy, ddd, significan stenosis at l45.  Assessment/Plan decompression and fusion at l34,45 with fusion using pedicles screws and pla arthrodesis. Poss l5s1 foraminotomies.Marland Kitchen He and his wife are aware of risks and benefits   Curtis Galloway M 06/08/2013, 11:50 AM

## 2013-06-08 NOTE — Progress Notes (Signed)
Per PACU pt had dural tear and needs to remain flat bedrest. No brace ordered / no brace delivered with patient from PACU

## 2013-06-08 NOTE — Progress Notes (Signed)
Op note  [phone]

## 2013-06-08 NOTE — Anesthesia Preprocedure Evaluation (Addendum)
Anesthesia Evaluation  Patient identified by MRN, date of birth, ID band Patient awake    Reviewed: Allergy & Precautions, H&P , NPO status , Patient's Chart, lab work & pertinent test results, reviewed documented beta blocker date and time   Airway Mallampati: II TM Distance: >3 FB Neck ROM: full    Dental   Pulmonary neg pulmonary ROS, former smoker,  breath sounds clear to auscultation        Cardiovascular hypertension, Pt. on home beta blockers and Pt. on medications + angina + CAD and + CABG Rhythm:regular     Neuro/Psych  Headaches, negative psych ROS   GI/Hepatic Neg liver ROS, GERD-  Medicated and Controlled,  Endo/Other  negative endocrine ROS  Renal/GU Renal InsufficiencyRenal disease  negative genitourinary   Musculoskeletal   Abdominal   Peds  Hematology negative hematology ROS (+)   Anesthesia Other Findings See surgeon's H&P   Reproductive/Obstetrics negative OB ROS                          Anesthesia Physical Anesthesia Plan  ASA: III  Anesthesia Plan: General   Post-op Pain Management:    Induction: Intravenous  Airway Management Planned: Oral ETT  Additional Equipment:   Intra-op Plan:   Post-operative Plan: Extubation in OR  Informed Consent: I have reviewed the patients History and Physical, chart, labs and discussed the procedure including the risks, benefits and alternatives for the proposed anesthesia with the patient or authorized representative who has indicated his/her understanding and acceptance.   Dental Advisory Given  Plan Discussed with: CRNA and Surgeon  Anesthesia Plan Comments:         Anesthesia Quick Evaluation

## 2013-06-08 NOTE — Preoperative (Signed)
Beta Blockers   Reason not to administer Beta Blockers:Not Applicable 

## 2013-06-08 NOTE — Transfer of Care (Signed)
Immediate Anesthesia Transfer of Care Note  Patient: Curtis Galloway  Procedure(s) Performed: Procedure(s): POSTERIOR LUMBAR INTERBODY FUSION LUMBAR THREE-FOUR-LUMBAR FOUR-FIVE (N/A)  Patient Location: PACU  Anesthesia Type:General  Level of Consciousness: awake, alert  and oriented  Airway & Oxygen Therapy: Patient Spontanous Breathing and Patient connected to nasal cannula oxygen  Post-op Assessment: Report given to PACU RN and Post -op Vital signs reviewed and stable  Post vital signs: Reviewed and stable  Complications: No apparent anesthesia complications

## 2013-06-09 MED ORDER — HEPARIN SODIUM (PORCINE) 5000 UNIT/ML IJ SOLN
5000.0000 [IU] | Freq: Three times a day (TID) | INTRAMUSCULAR | Status: DC
  Administered 2013-06-09 – 2013-06-14 (×16): 5000 [IU] via SUBCUTANEOUS
  Filled 2013-06-09 (×18): qty 1

## 2013-06-09 MED FILL — Heparin Sodium (Porcine) Inj 1000 Unit/ML: INTRAMUSCULAR | Qty: 30 | Status: AC

## 2013-06-09 MED FILL — Sodium Chloride IV Soln 0.9%: INTRAVENOUS | Qty: 1000 | Status: AC

## 2013-06-09 NOTE — Op Note (Signed)
NAMEMarland Kitchen  BALIN, VANDEGRIFT NO.:  [id number]  MEDICAL RECORD NO.:  [id number]  LOCATION:  [id number]                        FACILITY:  [facility]  PHYSICIAN:  Hilda Lias, M.D.   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  Multiple lumbar intervention.  Chronic radiculopathy.  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 Multiple lumbar intervention.  Chronic radiculopathy.  PROCEDURE:  Bilateral L4 and L3 laminectomy, decompression of the thecal sac.  Lysis of adhesion.  Foraminotomy.  Decompression of the L3, L4 and L5 nerve roots.  Bilateral L5-S1 foraminotomy to decompress the S1 nerve roots.  Exploration of fistula from the durometer into the canal with the microscope.  Bilateral L3-L4 and L4-L5 diskectomy.  The diskectomy was more than normal what we do to be able to introduce cages. Insertion of the two cages of 12 x 26 at L4-5 and 10 x 26 at the level of L3-4.  Interbody fusion with a BNP.  Pedicle screws at L3, L4 and L5. Posterolateral arthrodesis with autograft and BMP.  Microscope.  SURGEON:  Hilda Lias, M.D.  ASSISTANT:  Stefani Dama, M.D.  CLINICAL HISTORY:  Mr. Catoe is a 72 year old gentleman who in the past underwent at least 5 lumbar procedures, the last one was in 2005. The patient had been complaining of back pain radiation to both legs.  X- rays show severe degenerative disk disease with facet arthropathy at level of L3-4 and L4-5 with foraminal narrow at the level of L5-1 with disk at L5-S1 being normal.  Surgery was advised, and the patient and his wife knew the risk of the surgery including the possibility of  no improvement.  DESCRIPTION OF PROCEDURE:  The patient was taken to the OR, and after intubation, he was positioned in a prone manner.  The back was cleaned with Betadine and later on with DuraPrep.  Drapes were applied. Incision in the midline resecting the previous scar was done from L2-3 down to L4-5.  The muscle was retracted laterally all the way down to where we were able to feel the lateral facet at L3-4 and L4-5.  Then with the [place], we removed the spinous process of L4 and L3 and with the drill, we did laminectomies.  In the right side, with the decompression, we were able to see the L3, L4 and L5 nerve root, and there was some adhesion and lysis was accomplished.  Then, we entered the disk space in the right side at the level of L3-4 and at the level of L4-5, with doing a total diskectomy.  Diskectomy in the right side was more than normal to be able to introduce cages.  In the left side, the dissection took longer because the patient has quite a bit of scar tissue.  We found posterolateral to the left.  There was an area, which was packed with opening the durometer.  Because of that, we brought the microscope into the area.  We explored the area.  There was a little mass, which was dissected with the microscope.  Then, a probe was used and we were able to see that there were open connection between the durometer and the intradural canal.  The area was irrigated and specimen was sent to the laboratory for permanent pathology.  The opening durometer and the fistula was closed using 5-0 and 6-0 Prolene because the calcification of the durometer.  Then, with lysis of adhesion, we were able to decompress the L3, L4 and L5 nerve root in the left side. We went to the disk space and total diskectomy at the 3-4 and L4-5 was accomplished.  Then, two cages with BMP and autograft was inserted at the level of L3-4, they were 10 x 26 and at the level of L4-5 were 12 x 26.  Then using  the C-arm x-rays in AP view and then on lateral view, we probed the pedicle of L3, L4, L5.  Prior to introduction of the screws, we feel all four quadrants just to be sure that we surrounded by bone. Then, six screws for 5.5 x 40 were introduced.  The screws were kept in place using a rod and Capps.  Then, a cross-link from right to left was done.  We went laterally and we did remove the periosteum of the L3-4 and L4-5 lateral facet and the transverse process and a mix of BMP and autograft was used for arthrodesis.  Then using the drill, we did a laminotomy at L5-S1 bilaterally and foraminotomy to decompress the S1 nerve root.  The area was irrigated.  The wound was closed with three different layers of Vicryl and the skin with nylon.  The patient is going to go to PACU and then to the floor.          ______________________________ Hilda Lias, M.D.     EB/MEDQ  D:  06/08/2013  T:  06/09/2013  Job:  [id number]

## 2013-06-09 NOTE — Progress Notes (Signed)
PT Cancellation Note  Patient Details Name: Curtis Galloway MRN: [id number] DOB: 09-09-1941   Cancelled Treatment:    Reason Eval/Treat Not Completed: Medical issues which prohibited therapy.  Per chart pt with dural tear.  Will sign off and complete orders, await REORDER to initiate physical therapy mobility evaluation/retraining.  PLEASE REORDER therapies once pt is cleared to be OOB.  Thank you.   Narda Amber [facility] 06/09/2013, 8:23 AM

## 2013-06-09 NOTE — Progress Notes (Signed)
   CARE MANAGEMENT NOTE 06/09/2013  Patient:  Curtis Galloway, Curtis Galloway   Account Number:  [id number]  Date Initiated:  06/09/2013  Documentation initiated by:  Jiles Crocker  Subjective/Objective Assessment:   ADMITTED FOR SURGERY - Bilateral L4 and L3 laminectomy, decompression     Action/Plan:   LIVES AT HOME WITH SPOUSE/ CM FOLLOWING FOR DCP   Anticipated DC Date:  06/12/2013   Anticipated DC Plan: POSSIBLY  HOME W HOME HEALTH SERVICES, AWAITING FOR PT/OT EVALS;      DC Planning Services  CM consult              Status of service:  In process, will continue to follow Medicare Important Message given?  NA - LOS <3 / Initial given by admissions (If response is "NO", the following Medicare IM given date fields will be blank)  Per UR Regulation:  Reviewed for med. necessity/level of care/duration of stay  Comments:  06/09/2013- B Dekendrick Uzelac RN,BSN,MHA

## 2013-06-09 NOTE — Progress Notes (Signed)
OT Cancellation Note  Patient Details Name: Curtis Galloway MRN: [id number] DOB: 03/12/1941   Cancelled Treatment:    Reason Eval/Treat Not Completed: Medical issues which prohibited therapy Per chart pt with dural tear. Will sign off and complete orders, await REORDER to initiate occupational therapy. Evaluation. PLEASE REORDER therapies once pt is cleared to be OOB. Thank you.   Earlie Raveling OTR/L [phone] 06/09/2013, 10:14 AM

## 2013-06-09 NOTE — Progress Notes (Signed)
Patient ID: Curtis Galloway, male   DOB: July 03, 1941, 72 y.o.   MRN: [id number] No weakness, no headache. C/o incisional pain. Path report pending. Plan to start heparin sq. oob Friday am. Spoke with him and wife

## 2013-06-09 NOTE — Anesthesia Postprocedure Evaluation (Signed)
Anesthesia Post Note  Patient: Curtis Galloway  Procedure(s) Performed: Procedure(s) (LRB): POSTERIOR LUMBAR INTERBODY FUSION LUMBAR THREE-FOUR-LUMBAR FOUR-FIVE (N/A)  Anesthesia type: General  Patient location: PACU  Post pain: Pain level controlled  Post assessment: Patient's Cardiovascular Status Stable  Last Vitals:  Filed Vitals:   06/09/13 0607  BP: 149/62  Pulse: 107  Temp: 38.1 C  Resp: 16    Post vital signs: Reviewed and stable  Level of consciousness: alert  Complications: No apparent anesthesia complications

## 2013-06-10 ENCOUNTER — Inpatient Hospital Stay (HOSPITAL_COMMUNITY): Payer: Medicare Other

## 2013-06-10 NOTE — Progress Notes (Signed)
Patient ID: Curtis Galloway, male   DOB: 1940-10-22, 72 y.o.   MRN: [id number] Flat in bed, no headache. Move both legs, dressing dry. Spoke with wife. oob in am

## 2013-06-10 NOTE — Clinical Documentation Improvement (Signed)
THIS DOCUMENT IS NOT A PERMANENT PART OF THE MEDICAL RECORD  Please update your documentation with the medical record to reflect your response to this query. If you need help knowing how to do this please call [phone].  06/10/13   Dear Dr. Jeral Fruit,  In a better effort to capture your patient's severity of illness, reflect appropriate length of stay and utilization of resources, a review of the patient medical record has revealed the following indicators.   Based on your clinical judgment, please clarify and document in a progress note and/or discharge summary the clinical condition associated with the following supporting information: In responding to this query please exercise your independent judgment.  The fact that a query is asked, does not imply that any particular answer is desired or expected.   Hello Dr. Jeral Fruit!  Please help, if possible, by clarifying the following in a progress note or addendum to a previous note. Thanks you!  This is from the dictated OP note 9/10: "Then, a probe was used and we were able to see that there were open connection between the durometer and the intradural canal. The area was irrigated and specimen was sent to the laboratory for permanent pathology. The opening durometer and the fistula was closed using 5-0 and 6-0 Prolene because the calcification of the durometer."  RN note 9/9 states: "Per PACU pt had dural tear and needs to remain flat bedrest."  Please document if the patient did indeed have a dural tear, if it was an incidental finding during the operation or if was a complication of the operative procedure.    No additional documentation in chart upon review. SM   Thank You,  Saul Fordyce  Clinical Documentation Specialist: [phone] Health Information Management [place]

## 2013-06-11 LAB — CBC WITH DIFFERENTIAL/PLATELET
Basophils Relative: 0 % (ref 0–1)
HCT: 28.3 % — ABNORMAL LOW (ref 39.0–52.0)
Hemoglobin: 9.6 g/dL — ABNORMAL LOW (ref 13.0–17.0)
Lymphocytes Relative: 6 % — ABNORMAL LOW (ref 12–46)
MCHC: 33.9 g/dL (ref 30.0–36.0)
Monocytes Absolute: 1.4 10*3/uL — ABNORMAL HIGH (ref 0.1–1.0)
Monocytes Relative: 9 % (ref 3–12)
Neutro Abs: 13.6 10*3/uL — ABNORMAL HIGH (ref 1.7–7.7)
Neutrophils Relative %: 85 % — ABNORMAL HIGH (ref 43–77)
RBC: 3.13 MIL/uL — ABNORMAL LOW (ref 4.22–5.81)
WBC: 15.9 10*3/uL — ABNORMAL HIGH (ref 4.0–10.5)

## 2013-06-11 LAB — URINALYSIS, ROUTINE W REFLEX MICROSCOPIC
Nitrite: NEGATIVE
Specific Gravity, Urine: 1.026 (ref 1.005–1.030)
Urobilinogen, UA: 0.2 mg/dL (ref 0.0–1.0)
pH: 5 (ref 5.0–8.0)

## 2013-06-11 LAB — URINE MICROSCOPIC-ADD ON

## 2013-06-11 MED ORDER — TAMSULOSIN HCL 0.4 MG PO CAPS
0.8000 mg | ORAL_CAPSULE | Freq: Every day | ORAL | Status: DC
  Administered 2013-06-11 – 2013-06-14 (×4): 0.8 mg via ORAL
  Filled 2013-06-11 (×4): qty 2

## 2013-06-11 NOTE — Evaluation (Signed)
Physical Therapy Evaluation Patient Details Name: Curtis Galloway MRN: [id number] DOB: 03-09-1941 Today's Date: 06/11/2013 Time: 4098-1191 PT Time Calculation (min): 25 min  PT Assessment / Plan / Recommendation History of Present Illness  Patient is a 72 yo male s/p L3-S1 decompression/fusion surgery.  Patient with dural tear and on bedrest until 06/11/13.  Clinical Impression  Patient presents with problems listed below.  Today's session limited by pain and by lethargy - ? Due to meds.  Will benefit from acute PT to maximize independence prior to return home with wife.  Recommend OT consult for ADL training/equipment.    PT Assessment  Patient needs continued PT services    Follow Up Recommendations  Home health PT;Supervision/Assistance - 24 hour    Does the patient have the potential to tolerate intense rehabilitation      Barriers to Discharge        Equipment Recommendations  Rolling walker with 5" wheels;3in1 (PT)    Recommendations for Other Services OT consult   Frequency Min 5X/week    Precautions / Restrictions Precautions Precautions: Back;Fall Precaution Booklet Issued: Yes (comment) Precaution Comments: Reviewed back precautions with patient and wife. Restrictions Weight Bearing Restrictions: No   Pertinent Vitals/Pain Pain 8/10 impacting mobility.      Mobility  Bed Mobility Bed Mobility: Rolling Left;Left Sidelying to Sit;Sitting - Scoot to Edge of Bed Rolling Left: 3: Mod assist;With rail Left Sidelying to Sit: 2: Max assist;With rails;HOB flat Sitting - Scoot to Edge of Bed: 3: Mod assist Details for Bed Mobility Assistance: Verbal and tactile cues for technique.  Used bed pad to assist patient to roll onto left side.  Required max assist to move sidelying to sit, moving LE's off of bed and raising trunk to sitting.  Slow in processing instructions.  Assist to initiate movements. Transfers Transfers: Sit to Stand;Stand to Sit Sit to Stand: 3: Mod  assist;With upper extremity assist;From bed Stand to Sit: 4: Min assist;With upper extremity assist;With armrests;To chair/3-in-1 Details for Transfer Assistance: Verbal cues for hand placement and technique.  Assist to rise to standing, and for balance. Ambulation/Gait Ambulation/Gait Assistance: 3: Mod assist Ambulation Distance (Feet): 4 Feet Assistive device: Rolling walker Ambulation/Gait Assistance Details: Verbal cues for safe use of RW.  Required assist to maneuver RW.  Cues to stand upright - difficulty extending hips and knees.  Verbal cues for sequenceing for gait. Gait Pattern: Step-through pattern;Decreased stride length;Right flexed knee in stance;Left flexed knee in stance;Shuffle;Trunk flexed    Exercises     PT Diagnosis: Difficulty walking;Generalized weakness;Acute pain;Altered mental status  PT Problem List: Decreased strength;Decreased activity tolerance;Decreased balance;Decreased mobility;Decreased cognition;Decreased knowledge of use of DME;Decreased knowledge of precautions;Pain PT Treatment Interventions: DME instruction;Gait training;Stair training;Functional mobility training;Patient/family education     PT Goals(Current goals can be found in the care plan section) Acute Rehab PT Goals Patient Stated Goal: Wife: Safe discharge plan PT Goal Formulation: With patient/family Time For Goal Achievement: 06/18/13 Potential to Achieve Goals: Good  Visit Information  Last PT Received On: 06/11/13 Assistance Needed: +1 History of Present Illness: Patient is a 72 yo male s/p L3-S1 decompression/fusion surgery.  Patient with dural tear and on bedrest until 06/11/13.       Prior Functioning  Home Living Family/patient expects to be discharged to:: Private residence Living Arrangements: Spouse/significant other Available Help at Discharge: Family;Available 24 hours/day Type of Home: House Home Access: Stairs to enter [employer] of Steps: 3 Entrance  Stairs-Rails: Right;Left Home Layout: Multi-level;Bed/bath upstairs Alternate  Level Stairs-Number of Steps: 6 Alternate Level Stairs-Rails: Right Home Equipment: Crutches Additional Comments: Wife reports once patient is upstairs, he can stay upstairs (bed, bath, kitchen on upstairs level). Prior Function Level of Independence: Independent Communication Communication: No difficulties    Cognition  Cognition Arousal/Alertness: Lethargic;Suspect due to medications Behavior During Therapy: Flat affect Overall Cognitive Status: Impaired/Different from baseline Area of Impairment: Attention;Problem solving Current Attention Level: Sustained Problem Solving: Slow processing;Decreased initiation;Difficulty sequencing;Requires verbal cues (Possibly due to pain meds/lethargy)    Extremity/Trunk Assessment Upper Extremity Assessment Upper Extremity Assessment: Overall WFL for tasks assessed Lower Extremity Assessment Lower Extremity Assessment: Generalized weakness   Balance    End of Session PT - End of Session Equipment Utilized During Treatment: Gait belt Activity Tolerance: Patient limited by pain;Patient limited by lethargy Patient left: in chair;with call bell/phone within reach;with family/visitor present Nurse Communication: Mobility status  GP     Vena Austria 06/11/2013, 4:24 PM Durenda Hurt. Renaldo Fiddler, [facility] Acute Rehab Services Pager [phone]

## 2013-06-11 NOTE — Progress Notes (Signed)
Patient ID: Curtis Galloway, male   DOB: 09-01-1941, 72 y.o.   MRN: [id number] Became febril last night with increase of temperature. Wbc up to 15k. Chest xray atelectasis , patchy pneumonia. Now temp down. Oob. Less sleepy. Incisional pain 5/10. No headache. No weakness in legs. Plan , start pt, foley out. Respiratory therapist consult, stop ambien and diazepam.  Chest xray in am

## 2013-06-11 NOTE — Progress Notes (Signed)
Patient assessed per order BBS clear to diminished patient quit smoking 18 years ago, does not use any breathing treatments or oxygen at home patient has an incentive spirometer and archived 1300L/M and encouraged patient to use every hour.

## 2013-06-11 NOTE — Progress Notes (Signed)
Pt observed to be lethargic,very drowsy,unable to take deep breathes, PCA pump was always beeping due to low respirations,pt denies any pain, Dr Hirsch(on call) paged and informed if his PCA can be d/c,ordered to d/c PCA,same done at 2155,balance 10mg  morphine wasted,witnessed by the charge nurse(Edmund).Pt also had a fever of 101.2 at 2200 same reported to Dr Phoebe Perch who ordered to do a  CXR,blood cultures,UA and CBC,tab percocet 2 given for pain and fever at 2222,pt encouraged to use the incentive spirometer, repositioned and made comfortable in bed,family at bedside,will continue to monitor. Obasogie-Asidi, Shawndrea Rutkowski Efe

## 2013-06-12 ENCOUNTER — Inpatient Hospital Stay (HOSPITAL_COMMUNITY): Payer: Medicare Other

## 2013-06-12 NOTE — Progress Notes (Signed)
No issues overnight. Pt does not c/o new numbness/tingling/weakness. Ambulating with rolling walker, but only OOB one time yesterday with PT. Otherwise well.  EXAM:  BP 124/73  Pulse 92  Temp(Src) 97.9 F (36.6 C) (Oral)  Resp 20  Ht 5\' 9"  (1.753 m)  Wt 76.47 kg (168 lb 9.4 oz)  BMI 24.88 kg/m2  SpO2 96%  Awake, alert, oriented  Speech fluent, appropriate  CN grossly intact  5/5 BUE/BLE  Wound c/d/i  IMAGING: CXR reviewed, no evidence of infiltrate/pneumonia.  IMPRESSION:  72 y.o. male POD# 4 s/p L3-5 PLIF Neurologically well  PLAN: - Cont ambulating today, plan on D/C tomorrow. - Cont IS

## 2013-06-12 NOTE — Progress Notes (Addendum)
Physical Therapy Treatment Patient Details Name: Curtis Galloway MRN: [id number] DOB: 01-17-1941 Today's Date: 06/12/2013 Time: 1478-2956 PT Time Calculation (min): 24 min  PT Assessment / Plan / Recommendation  History of Present Illness Patient is a 72 yo male s/p L3-S1 decompression/fusion surgery.  Patient with dural tear and on bedrest until 06/11/13.   PT Comments   Patient continues to have increased pain, limiting mobility.  Continues to be lethargic.  Patient making slow progress with mobility.  Patient is able to ambulate only 10' at a time, and he has 6 steps to get into house.  Do not feel patient will be ready for discharge tomorrow.  Will return for PT session in am.  Also, patient would benefit from and OT consult.  MD:  Please order OT consult if you agree.  Thank you.  Follow Up Recommendations  Home health PT;Supervision/Assistance - 24 hour     Does the patient have the potential to tolerate intense rehabilitation     Barriers to Discharge        Equipment Recommendations  Rolling walker with 5" wheels;3in1 (PT)    Recommendations for Other Services OT consult  Frequency Min 5X/week   Progress towards PT Goals Progress towards PT goals: Progressing toward goals (Slow progress due to pain)  Plan Current plan remains appropriate    Precautions / Restrictions Precautions Precautions: Back;Fall Precaution Comments: Reviewed back precautions with patient and wife. Restrictions Weight Bearing Restrictions: No   Pertinent Vitals/Pain Pain 10/10 impacting mobility.    Mobility  Bed Mobility Bed Mobility: Not assessed Transfers Transfers: Sit to Stand;Stand to Sit Sit to Stand: 4: Min assist;With upper extremity assist;With armrests;From chair/3-in-1 Stand to Sit: 4: Min assist;With upper extremity assist;With armrests;To chair/3-in-1 Details for Transfer Assistance: Verbal cues for hand placement and technique.  Assist to rise to  standing. Ambulation/Gait Ambulation/Gait Assistance: 4: Min assist Ambulation Distance (Feet): 20 Feet (with one sitting rest break.) Assistive device: Rolling walker Ambulation/Gait Assistance Details: Verbal cues to stand upright and look up.  Patient with flexed hips and knees during gait.  Reports knees are "giving way" Gait Pattern: Step-through pattern;Decreased stride length;Right flexed knee in stance;Left flexed knee in stance;Shuffle;Trunk flexed Gait velocity: Slow gait speed      PT Goals (current goals can now be found in the care plan section)    Visit Information  Last PT Received On: 06/12/13 Assistance Needed: +1 History of Present Illness: Patient is a 72 yo male s/p L3-S1 decompression/fusion surgery.  Patient with dural tear and on bedrest until 06/11/13.    Subjective Data  Subjective: "I'm hurting"   Cognition  Cognition Arousal/Alertness: Lethargic Behavior During Therapy: Flat affect Overall Cognitive Status: Impaired/Different from baseline Area of Impairment: Attention;Problem solving Current Attention Level: Sustained Memory: Decreased short-term memory;Decreased recall of precautions Problem Solving: Slow processing;Decreased initiation;Difficulty sequencing;Requires verbal cues    Balance     End of Session PT - End of Session Equipment Utilized During Treatment: Gait belt Activity Tolerance: Patient limited by pain;Patient limited by lethargy Patient left: in chair;with call bell/phone within reach;with family/visitor present;with nursing/sitter in room Nurse Communication: Mobility status;Patient requests pain meds   GP     Vena Austria 06/12/2013, 5:18 PM Durenda Hurt. Renaldo Fiddler, [facility] Acute Rehab Services Pager [phone]

## 2013-06-13 NOTE — Progress Notes (Signed)
Subjective: Patient reports less confused and doing better today  Objective: Vital signs in last 24 hours: Temp:  [97.9 F (36.6 C)-99.9 F (37.7 C)] 98.8 F (37.1 C) (09/14 0618) Pulse Rate:  [88-111] 88 (09/14 0618) Resp:  [20] 20 (09/14 0618) BP: (124-160)/(63-73) 135/69 mmHg (09/14 0618) SpO2:  [95 %-98 %] 97 % (09/14 0618)  Intake/Output from previous day:   Intake/Output this shift:    Physical Exam: Good strength.  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 Cardiac and mediastinal contours are unchanged.  Bibasilar bullous change with chronic linear scarring in the left base appears similar compared to prior studies.  Central bronchitic changes and background interstitial prominence are also similar compared to prior.  Mild pulmonary vascular congestion without overt edema.  No pneumothorax or focal airspace consolidation.  No acute osseous abnormality.  The inferior most sternal wire is fractured.  This is stable.  IMPRESSION:  1.  No acute cardiopulmonary process. 2.  Stable changes of COPD and emphysema with bibasilar bullous change and chronic linear scarring in the left base.   Original Report Authenticated By: Malachy Moan, M.D.    Assessment/Plan: Anticipate D/C in am after completing PT.    LOS: 5 days    Alizia Greif D, MD 06/13/2013, 7:52 AM

## 2013-06-13 NOTE — Progress Notes (Signed)
Physical Therapy Treatment Patient Details Name: Curtis Galloway MRN: [id number] DOB: Jun 23, 1941 Today's Date: 06/13/2013 Time: 4098-1191 PT Time Calculation (min): 24 min  PT Assessment / Plan / Recommendation  History of Present Illness Patient is a 72 yo male s/p L3-S1 decompression/fusion surgery.  Patient with dural tear and on bedrest until 06/11/13.   PT Comments   Patient making improvements with mobility and gait.  Able to increase distance to 35' before needing to stop.  Pain continues to limit mobility.  Patient unable to attempt stairs this am.  Do not feel patient is ready for discharge today.  Also, patient would benefit from an OT consult for ADL training with back precautions.  MD:  Please order OT consult if you agree.  Thank you.   Follow Up Recommendations  Home health PT;Supervision/Assistance - 24 hour     Does the patient have the potential to tolerate intense rehabilitation     Barriers to Discharge        Equipment Recommendations  Rolling walker with 5" wheels;3in1 (PT)    Recommendations for Other Services OT consult  Frequency Min 5X/week   Progress towards PT Goals Progress towards PT goals: Progressing toward goals  Plan Current plan remains appropriate    Precautions / Restrictions Precautions Precautions: Back;Fall Precaution Comments: Reviewed back precautions with patient and wife. Restrictions Weight Bearing Restrictions: No   Pertinent Vitals/Pain Pain continues to limit mobility    Mobility  Bed Mobility Bed Mobility: Rolling Left;Left Sidelying to Sit;Sitting - Scoot to Edge of Bed Rolling Left: 4: Min guard;With rail Left Sidelying to Sit: 4: Min assist;With rails;HOB flat Sitting - Scoot to Edge of Bed: 4: Min guard Details for Bed Mobility Assistance: Verbal cues for technique.  Assist to raise trunk to sitting position.  Increased time for mobility. Transfers Transfers: Sit to Stand;Stand to Sit Sit to Stand: 4: Min assist;With  upper extremity assist;From bed;With armrests;From chair/3-in-1 Stand to Sit: 4: Min assist;With upper extremity assist;With armrests;To chair/3-in-1 Details for Transfer Assistance: Verbal reminders for hand placement.  Assist to rise to standing and to control descent into chair. Ambulation/Gait Ambulation/Gait Assistance: 4: Min assist Ambulation Distance (Feet): 66 Feet (with one sitting rest break.) Assistive device: Rolling walker Ambulation/Gait Assistance Details: Verbal cues to stand upright and look up during gait. Gait Pattern: Step-through pattern;Decreased stride length;Trunk flexed Gait velocity: Slow gait speed Stairs: No (Patient declined due to pain)      PT Goals (current goals can now be found in the care plan section)    Visit Information  Last PT Received On: 06/13/13 Assistance Needed: +1 History of Present Illness: Patient is a 72 yo male s/p L3-S1 decompression/fusion surgery.  Patient with dural tear and on bedrest until 06/11/13.    Subjective Data  Subjective: "I'm feeling better laying still in bed"   Cognition  Cognition Arousal/Alertness: Awake/alert Behavior During Therapy: Flat affect Overall Cognitive Status: Within Functional Limits for tasks assessed    Balance     End of Session PT - End of Session Equipment Utilized During Treatment: Gait belt Activity Tolerance: Patient limited by pain;Patient limited by fatigue Patient left: in chair;with call bell/phone within reach;with family/visitor present Nurse Communication: Mobility status   GP     Vena Austria 06/13/2013, 10:04 AM Durenda Hurt. Renaldo Fiddler, [facility] Acute Rehab Services Pager [phone]

## 2013-06-14 NOTE — Care Management Note (Signed)
    Page 1 of 2   06/14/2013     4:13:51 PM   CARE MANAGEMENT NOTE 06/14/2013  Patient:  Curtis Galloway, Curtis Galloway   Account Number:  [id number]  Date Initiated:  06/09/2013  Documentation initiated by:  Jiles Crocker  Subjective/Objective Assessment:   ADMITTED FOR SURGERY - Bilateral L4 and L3 laminectomy, decompression     Action/Plan:   LIVES AT HOME WITH SPOUSE/ CM FOLLOWING FOR DCP   Anticipated DC Date:  06/12/2013   Anticipated DC Plan:  HOME W HOME HEALTH SERVICES      DC Planning Services  CM consult      Choice offered to / List presented to:  C-3 Spouse   DME arranged  Levan Hurst      DME agency  [facility]     HH arranged  HH-2 PT  HH-3 OT      Status of service:  Completed, signed off Medicare Important Message given?  NA - LOS <3 / Initial given by admissions (If response is "NO", the following Medicare IM given date fields will be blank) Date Medicare IM given:   Date Additional Medicare IM given:    Discharge Disposition:    Per UR Regulation:  Reviewed for med. necessity/level of care/duration of stay  If discussed at Long Length of Stay Meetings, dates discussed:    Comments:  06/14/13 1610 Elmer Bales RN, MSN, CM- Recieved call from [facility] with [facility] accepting referral.  Address was verified with patient, correct in the chart.   06/14/13 1530 Elmer Bales RN, MSN, CM- Met with patient and wife to discuss home health needs. Pt and wife have chosen [facility] for PT. Voicemail was left with Corrie Dandy at [facility] for referral. Rolling walker order was placed and [facility] DME was notified of pending discharge today.   06/09/2013- B CHANDLER RN,BSN,MHA

## 2013-06-14 NOTE — Discharge Summary (Signed)
Physician Discharge Summary  Patient ID: Curtis Galloway MRN: [id number] DOB/AGE: 04/09/1941 73 y.o.  Admit date: 06/08/2013 Discharge date: 06/14/2013  Admission Diagnoses:degenerative lumbar disc disease  Discharge Diagnoses: same plus resection of intra-extradural mass. Path pending Active Problems:   * No active hospital problems. *   Discharged Condition:no weakness  Hospital Course: lumbar fusion  Consults: none  Significant Diagnostic Studies:myelogram  Treatments:lumbar fusion with pedicles screws. pla  Discharge Exam: Blood pressure 123/68, pulse 72, temperature 98.5 F (36.9 C), temperature source Oral, resp. rate 18, height 5\' 9"  (1.753 m), weight 76.47 kg (168 lb 9.4 oz), SpO2 96.00%. Incisional pain, no weakness  Disposition: home . To see me in 10 days     Medication List    ASK your doctor about these medications       aspirin 81 MG tablet  Take 81 mg by mouth daily.     atorvastatin 80 MG tablet  Commonly known as:  LIPITOR  Take 1 tablet (80 mg total) by mouth at bedtime.     clopidogrel 75 MG tablet  Commonly known as:  PLAVIX  Take 1 tablet (75 mg total) by mouth daily.     fish oil-omega-3 fatty acids 1000 MG capsule  Take 2 g by mouth daily.     metoprolol succinate 50 MG 24 hr tablet  Commonly known as:  TOPROL-XL  Take 1 tablet (50 mg total) by mouth daily.     multivitamin tablet  Take 1 tablet by mouth daily.     omeprazole 20 MG capsule  Commonly known as:  PRILOSEC  Take 20 mg by mouth as needed. Acid reflux         Signed: Karn Cassis 06/14/2013, 3:35 PM

## 2013-06-14 NOTE — Progress Notes (Signed)
Physical Therapy Treatment Patient Details Name: Curtis Galloway MRN: [id number] DOB: [birth year] Today's Date: 06/14/2013 Time: 4098-1191 PT Time Calculation (min): 28 min  PT Assessment / Plan / Recommendation  History of Present Illness Patient is a 72 yo male s/p L3-S1 decompression/fusion surgery.  Patient with dural tear and on bedrest until 06/11/13.   PT Comments   Pt is showing good improvement today, ambulated 100' and performed stairs without difficulty. He should progress well with HHPT after d/c. PT will continue to follow if he does not d/c today.   Follow Up Recommendations  Home health PT;Supervision/Assistance - 24 hour     Does the patient have the potential to tolerate intense rehabilitation     Barriers to Discharge        Equipment Recommendations  Rolling walker with 5" wheels    Recommendations for Other Services OT consult  Frequency Min 5X/week   Progress towards PT Goals Progress towards PT goals: Progressing toward goals  Plan Current plan remains appropriate    Precautions / Restrictions Precautions Precautions: Back;Fall Precaution Booklet Issued: No Precaution Comments: Pt recalled no twisting and bending, cues to remind of no arching Restrictions Weight Bearing Restrictions: No   Pertinent Vitals/Pain 6/10 back pain, premedicated    Mobility  Bed Mobility Bed Mobility: Rolling Right;Rolling Left;Left Sidelying to Sit;Sit to Supine Rolling Right: 6: Modified independent (Device/Increase time) Rolling Left: 6: Modified independent (Device/Increase time) Left Sidelying to Sit: 6: Modified independent (Device/Increase time) Sitting - Scoot to Edge of Bed: 6: Modified independent (Device/Increase time) Sit to Supine: 6: Modified independent (Device/Increase time) Details for Bed Mobility Assistance: encouragement to perform on his own and not rely on wife Transfers Transfers: Sit to Stand;Stand to Sit Sit to Stand: 5: Supervision;From bed;From  chair/3-in-1;With upper extremity assist Stand to Sit: 5: Supervision;To bed;To chair/3-in-1;With upper extremity assist Details for Transfer Assistance: pt with proper hand placement for sit to stand, vc's to remind to reach back for stable surface with sitting Ambulation/Gait Ambulation/Gait Assistance: 5: Supervision Ambulation Distance (Feet): 100 Feet Assistive device: Rolling walker Ambulation/Gait Assistance Details: vc's for upright posture, able to appropriately steer RW as well as tolerate increased distance and increased pace Gait Pattern: Step-through pattern;Decreased stride length Gait velocity: still decreased but increasing Stairs: Yes Stairs Assistance: 5: Supervision Stairs Assistance Details (indicate cue type and reason): pt used left rail only as that is what he has available at home, was able to navigate stairs without trouble and still had energy to ambulate afterwards. Stair Management Technique: One rail Left;Step to pattern;Forwards Number of Stairs: 6 Wheelchair Mobility Wheelchair Mobility: No    Exercises     PT Diagnosis:    PT Problem List:   PT Treatment Interventions:     PT Goals (current goals can now be found in the care plan section) Acute Rehab PT Goals Patient Stated Goal: Wife: Safe discharge plan PT Goal Formulation: With patient/family Time For Goal Achievement: 06/18/13 Potential to Achieve Goals: Good  Visit Information  Last PT Received On: 06/14/13 Assistance Needed: +1 History of Present Illness: Patient is a 72 yo male s/p L3-S1 decompression/fusion surgery.  Patient with dural tear and on bedrest until 06/11/13.    Subjective Data  Subjective: pt had a rough night but is feeling better now Patient Stated Goal: Wife: Safe discharge plan   Cognition  Cognition Arousal/Alertness: Awake/alert Behavior During Therapy: WFL for tasks assessed/performed Overall Cognitive Status: Within Functional Limits for tasks assessed General  Comments: pt appropriate  today and with better short term recall, wife reports that he is much more himself today    Balance  Balance Balance Assessed: Yes Dynamic Standing Balance Dynamic Standing - Balance Support: Left upper extremity supported;During functional activity Dynamic Standing - Level of Assistance: 5: Stand by assistance  End of Session PT - End of Session Equipment Utilized During Treatment: Gait belt Activity Tolerance: Patient tolerated treatment well Patient left: in bed;with call bell/phone within reach;with family/visitor present Nurse Communication: Mobility status   GP   Lyanne Co, PT  Acute Rehab Services  [phone]   Lyanne Co 06/14/2013, 11:33 AM

## 2013-06-14 NOTE — Progress Notes (Signed)
Patient ID: Curtis Galloway, male   DOB: [birth year], 72 y.o.   MRN: [id number] Better, wound dry. Poor activity, plan to dc today or in am based on pt input

## 2013-06-17 LAB — CULTURE, BLOOD (ROUTINE X 2): Culture: NO GROWTH

## 2013-10-13 ENCOUNTER — Telehealth: Payer: Self-pay | Admitting: *Deleted

## 2013-10-13 NOTE — Telephone Encounter (Signed)
Referral faxed to [facility]. Awaiting authorization.

## 2013-10-13 NOTE — Telephone Encounter (Signed)
Patient called and stated that he need a referral to his Cardiologist to [facility]. Already has an appointment for January 21 st

## 2013-10-20 ENCOUNTER — Ambulatory Visit (INDEPENDENT_AMBULATORY_CARE_PROVIDER_SITE_OTHER): Payer: Medicare HMO | Admitting: Cardiovascular Disease

## 2013-10-20 ENCOUNTER — Encounter: Payer: Self-pay | Admitting: Cardiovascular Disease

## 2013-10-20 VITALS — BP 124/82 | HR 64 | Ht 69.0 in | Wt 170.0 lb

## 2013-10-20 DIAGNOSIS — Z0000 Encounter for general adult medical examination without abnormal findings: Secondary | ICD-10-CM

## 2013-10-20 DIAGNOSIS — I2510 Atherosclerotic heart disease of native coronary artery without angina pectoris: Secondary | ICD-10-CM

## 2013-10-20 DIAGNOSIS — R079 Chest pain, unspecified: Secondary | ICD-10-CM

## 2013-10-20 LAB — HEPATIC FUNCTION PANEL
ALBUMIN: 3.5 g/dL (ref 3.5–5.2)
ALT: 16 U/L (ref 0–53)
AST: 17 U/L (ref 0–37)
Alkaline Phosphatase: 103 U/L (ref 39–117)
Bilirubin, Direct: 0 mg/dL (ref 0.0–0.3)
TOTAL PROTEIN: 7.4 g/dL (ref 6.0–8.3)
Total Bilirubin: 0.5 mg/dL (ref 0.3–1.2)

## 2013-10-20 LAB — CBC WITH DIFFERENTIAL/PLATELET
BASOS PCT: 0.4 % (ref 0.0–3.0)
Basophils Absolute: 0 10*3/uL (ref 0.0–0.1)
EOS PCT: 1.3 % (ref 0.0–5.0)
Eosinophils Absolute: 0.1 10*3/uL (ref 0.0–0.7)
HEMATOCRIT: 37.1 % — AB (ref 39.0–52.0)
HEMOGLOBIN: 12.4 g/dL — AB (ref 13.0–17.0)
LYMPHS ABS: 1.7 10*3/uL (ref 0.7–4.0)
Lymphocytes Relative: 18.1 % (ref 12.0–46.0)
MCHC: 33.4 g/dL (ref 30.0–36.0)
MCV: 86.6 fl (ref 78.0–100.0)
MONO ABS: 0.7 10*3/uL (ref 0.1–1.0)
Monocytes Relative: 8 % (ref 3.0–12.0)
NEUTROS ABS: 6.7 10*3/uL (ref 1.4–7.7)
Neutrophils Relative %: 72.2 % (ref 43.0–77.0)
Platelets: 271 10*3/uL (ref 150.0–400.0)
RBC: 4.29 Mil/uL (ref 4.22–5.81)
RDW: 14.8 % — ABNORMAL HIGH (ref 11.5–14.6)
WBC: 9.2 10*3/uL (ref 4.5–10.5)

## 2013-10-20 LAB — PSA: PSA: 0.42 ng/mL (ref 0.10–4.00)

## 2013-10-20 LAB — BASIC METABOLIC PANEL
BUN: 16 mg/dL (ref 6–23)
CALCIUM: 8.7 mg/dL (ref 8.4–10.5)
CO2: 26 meq/L (ref 19–32)
Chloride: 107 mEq/L (ref 96–112)
Creatinine, Ser: 1.1 mg/dL (ref 0.4–1.5)
GFR: 71.64 mL/min (ref >60.00)
GLUCOSE: 112 mg/dL — AB (ref 70–99)
Potassium: 3.8 mEq/L (ref 3.5–5.1)
Sodium: 140 mEq/L (ref 135–145)

## 2013-10-20 LAB — LIPID PANEL
CHOLESTEROL: 145 mg/dL (ref 0–200)
HDL: 47.2 mg/dL (ref >39.00)
LDL Cholesterol: 88 mg/dL (ref 0–99)
Total CHOL/HDL Ratio: 3
Triglycerides: 50 mg/dL (ref 0.0–149.0)
VLDL: 10 mg/dL (ref 0.0–40.0)

## 2013-10-20 LAB — HEMOGLOBIN A1C: Hgb A1c MFr Bld: 6.6 % — ABNORMAL HIGH (ref 4.6–6.5)

## 2013-10-20 MED ORDER — NITROGLYCERIN 0.4 MG SL SUBL: 0.4000 mg | SUBLINGUAL_TABLET | SUBLINGUAL | Status: DC | PRN

## 2013-10-20 NOTE — Progress Notes (Signed)
Patient ID: Curtis Galloway, male   DOB: 11/13/1940, 73 y.o.   MRN: [id number] 73 yo recently hospitalized for SSCP. Cath showed occluded grafts but suitable for medical Rx  On prilosec for reflux. Needs P2Y test. No angina. Wallks daily. Intolerant to nitrates with headache  Did not want to be on Effient due to cost. Compliant with meds  Right dominant with no anomalies Cath 11/01/11  LM: 30%   LAD: 50-60% proximal LAD just after a small diffusely diseased D1. Mid vessel 30% disease. Distal vessel is tented from LIMA with minor disease D2 100% IM: 40-50% medium size vessel  Circumflex: 40% mid. Large OM1 30-40% multiple discrete lesions. AV circ occluded  RCA: 100% ostial occlusion with only filling of RV/SA branch. Occluded proximal stents. Extensive Left to right collaterals to RCA  Grafts:  SVG: to PDA occluded New since 2003  SVG: Diagonal occluded New since 2003  Free Rima: Came off diagonal graft atretic in 2003  [place]: Known to be occluded cath 2003  Hemodynamics:  Aortic Pressure: 134 76mmHg LV Pressure: 135 18 mmHg  Impression: Despite all his grafts being occluded circulation appears stable for medical Rx. Proximal LAD similar to 2003. Collaterals to RCA  Add Effient and nitrates. D/C in am for outpatient F/U Enzymes negative so far and no acute ECG changes. Check Cr in am  D/C with Effient and nitrates. Severe headache with imdur stopped. No angina, dyspnea or palpitaitons  10/20/12  LFT;s were normal on high dose statins  Did not see Dr Linna Darner last year  Back surgery with [facility] in September  Still chewing long leaf tobacco   Some sharp chest pains with ambulation not always exertional Some dyspnea No cough sputum or fever Has not taken nitro needs new script.  Pain left sided no radiation to arm or neck   ROS: Denies fever, malais, weight loss, blurry vision, decreased visual acuity, cough, sputum, SOB, hemoptysis, pleuritic pain, palpitaitons, heartburn, abdominal pain, melena,  lower extremity edema, claudication, or rash.  All other systems reviewed and negative  General: Affect appropriate Chronically ill white male  HEENT: normal Neck supple with no adenopathy JVP normal no bruits no thyromegaly Lungs faint interstitial crackles no wheezing and good diaphragmatic motion Heart:  S1/S2 no murmur, no rub, gallop or click PMI normal Abdomen: benighn, BS positve, no tenderness, no AAA no bruit.  No HSM or HJR Distal pulses intact with no bruits No edema Neuro non-focal Skin warm and dry No muscular weakness   Current Outpatient Prescriptions  Medication Sig Dispense Refill  . aspirin 81 MG tablet Take 81 mg by mouth daily.        Marland Kitchen atorvastatin (LIPITOR) 80 MG tablet Take 1 tablet (80 mg total) by mouth at bedtime.  90 tablet  3  . clopidogrel (PLAVIX) 75 MG tablet Take 75 mg by mouth daily.      . fish oil-omega-3 fatty acids 1000 MG capsule Take 2 g by mouth daily.       . metoprolol succinate (TOPROL-XL) 50 MG 24 hr tablet Take 1 tablet (50 mg total) by mouth daily.  90 tablet  3  . Multiple Vitamin (MULTIVITAMIN) tablet Take 1 tablet by mouth daily.        Marland Kitchen omeprazole (PRILOSEC) 20 MG capsule Take 20 mg by mouth as needed. Acid reflux       No current facility-administered medications for this visit.    Allergies  Codeine and Tramadol  Electrocardiogram:  SR rate  65 nonspecific ST/T wave changes   Assessment and Plan

## 2013-10-20 NOTE — Assessment & Plan Note (Signed)
Cholesterol is at goal.  Continue current dose of statin and diet Rx.  No myalgias or side effects.  F/U  LFT's in 6 months. Lab Results  Component Value Date   LDLCALC 71 04/09/2013   Labs and LFTls today continue statin

## 2013-10-20 NOTE — Assessment & Plan Note (Signed)
Well controlled.  Continue current medications and low sodium Dash type diet.    

## 2013-10-20 NOTE — Patient Instructions (Signed)
Your physician wants you to follow-up in:   [place] will receive a reminder letter in the mail two months in advance. If you don't receive a letter, please call our office to schedule the follow-up appointment. Your physician recommends that you continue on your current medications as directed. Please refer to the Current Medication list given to you today.  Your physician recommends that you return for lab work in:  [place]  PSA A1C   Your physician has requested that you have a lexiscan myoview. For further information please visit [email or web address]. Please follow instruction sheet, as given.

## 2013-10-20 NOTE — Assessment & Plan Note (Signed)
Dyspnea and atypical pain with ambulation Known occluded grafts  Unable to walk on treadmill due to back problems  F/U lexiscan myovue

## 2013-10-22 ENCOUNTER — Other Ambulatory Visit: Payer: Self-pay | Admitting: *Deleted

## 2013-10-22 MED ORDER — ATORVASTATIN CALCIUM 80 MG PO TABS: 80.0000 mg | ORAL_TABLET | Freq: Every day | ORAL | Status: DC

## 2013-10-22 MED ORDER — METOPROLOL SUCCINATE ER 50 MG PO TB24: 50.0000 mg | ORAL_TABLET | Freq: Every day | ORAL | Status: DC

## 2013-10-22 MED ORDER — CLOPIDOGREL BISULFATE 75 MG PO TABS: 75.0000 mg | ORAL_TABLET | Freq: Every day | ORAL | Status: DC

## 2013-10-25 ENCOUNTER — Other Ambulatory Visit: Payer: Self-pay | Admitting: *Deleted

## 2013-10-25 DIAGNOSIS — I2510 Atherosclerotic heart disease of native coronary artery without angina pectoris: Secondary | ICD-10-CM

## 2013-10-25 MED ORDER — NITROGLYCERIN 0.4 MG SL SUBL: 0.4000 mg | SUBLINGUAL_TABLET | SUBLINGUAL | Status: DC | PRN

## 2013-10-25 MED ORDER — CLOPIDOGREL BISULFATE 75 MG PO TABS: 75.0000 mg | ORAL_TABLET | Freq: Every day | ORAL | Status: DC

## 2013-10-25 MED ORDER — ATORVASTATIN CALCIUM 80 MG PO TABS: 80.0000 mg | ORAL_TABLET | Freq: Every day | ORAL | Status: DC

## 2013-10-25 MED ORDER — METOPROLOL SUCCINATE ER 50 MG PO TB24: 50.0000 mg | ORAL_TABLET | Freq: Every day | ORAL | Status: DC

## 2013-11-01 ENCOUNTER — Encounter: Payer: Self-pay | Admitting: Cardiology

## 2013-11-02 ENCOUNTER — Ambulatory Visit (HOSPITAL_COMMUNITY): Payer: Medicare HMO | Attending: Cardiology | Admitting: Radiology

## 2013-11-02 ENCOUNTER — Encounter: Payer: Self-pay | Admitting: Cardiology

## 2013-11-02 VITALS — BP 170/95 | HR 86 | Ht 69.0 in | Wt 170.0 lb

## 2013-11-02 DIAGNOSIS — R9431 Abnormal electrocardiogram [ECG] [EKG]: Secondary | ICD-10-CM | POA: Insufficient documentation

## 2013-11-02 DIAGNOSIS — Z8249 Family history of ischemic heart disease and other diseases of the circulatory system: Secondary | ICD-10-CM | POA: Insufficient documentation

## 2013-11-02 DIAGNOSIS — I10 Essential (primary) hypertension: Secondary | ICD-10-CM | POA: Insufficient documentation

## 2013-11-02 DIAGNOSIS — Z951 Presence of aortocoronary bypass graft: Secondary | ICD-10-CM | POA: Insufficient documentation

## 2013-11-02 DIAGNOSIS — Z87891 Personal history of nicotine dependence: Secondary | ICD-10-CM | POA: Insufficient documentation

## 2013-11-02 DIAGNOSIS — I2510 Atherosclerotic heart disease of native coronary artery without angina pectoris: Secondary | ICD-10-CM

## 2013-11-02 DIAGNOSIS — I252 Old myocardial infarction: Secondary | ICD-10-CM | POA: Insufficient documentation

## 2013-11-02 DIAGNOSIS — E785 Hyperlipidemia, unspecified: Secondary | ICD-10-CM | POA: Insufficient documentation

## 2013-11-02 DIAGNOSIS — R079 Chest pain, unspecified: Secondary | ICD-10-CM

## 2013-11-02 DIAGNOSIS — R0602 Shortness of breath: Secondary | ICD-10-CM | POA: Insufficient documentation

## 2013-11-02 MED ORDER — TECHNETIUM TC 99M SESTAMIBI GENERIC - CARDIOLITE
30.0000 | Freq: Once | INTRAVENOUS | Status: AC | PRN
  Administered 2013-11-02: 30 via INTRAVENOUS

## 2013-11-02 MED ORDER — TECHNETIUM TC 99M SESTAMIBI GENERIC - CARDIOLITE
10.0000 | Freq: Once | INTRAVENOUS | Status: AC | PRN
  Administered 2013-11-02: 10 via INTRAVENOUS

## 2013-11-02 MED ORDER — REGADENOSON 0.4 MG/5ML IV SOLN
0.4000 mg | Freq: Once | INTRAVENOUS | Status: AC
  Administered 2013-11-02: 0.4 mg via INTRAVENOUS

## 2013-11-02 NOTE — Progress Notes (Signed)
[name] [address] [place], [place] [address] [phone]    Cardiology Nuclear Med Study  Curtis Galloway is a 73 y.o. male     MRN : [id number]     DOB: 1941-04-11  Procedure Date: 11/02/2013  Nuclear Med Background Indication for Stress Test:  Evaluation for Ischemia, Graft Patency, Stent Patency and Abnormal [id number] ST changes History:  CAD, MI, Cath 2013, CABG, Stent, MPI 2008 (normal) EF 68% Cardiac Risk Factors: Family History - CAD, History of Smoking, Hypertension and Lipids  Symptoms:  Chest Pain and SOB   Nuclear Pre-Procedure Caffeine/Decaff Intake:  None NPO After: 7:00pm   Lungs:  clear O2 Sat: 98% on room air. IV 0.9% NS with Angio Cath:  22g  IV Site: L Hand  IV Started by:  Curtis Galloway, CNMT  Chest Size (in):  44 Cup Size: n/a  Height: 5\' 9"  (1.753 m)  Weight:  170 lb (77.111 kg)  BMI:  Body mass index is 25.09 kg/(m^2). Tech Comments:  No Toprol x 24 hrs    Nuclear Med Study 1 or 2 day study: 1 day  Stress Test Type:  Treadmill/Lexiscan  Reading MD: n/a  Order Authorizing Provider:  Verlin Galloway  Resting Radionuclide: Technetium 59m Sestamibi  Resting Radionuclide Dose: 11.0 mCi   Stress Radionuclide:  Technetium 24m Sestamibi  Stress Radionuclide Dose: 33.0 mCi           Stress Protocol Rest HR: 86 Stress HR: 115  Rest BP: 170/95 Stress BP: 118/72  Exercise Time (min): n/a METS: n/a           Dose of Adenosine (mg):  n/a Dose of Lexiscan: 0.4 mg  Dose of Atropine (mg): n/a Dose of Dobutamine: n/a mcg/kg/min (at max HR)  Stress Test Technologist: Curtis Galloway, BS-ES  Nuclear Technologist:  Curtis Galloway, CNMT     Rest Procedure:  Myocardial perfusion imaging was performed at rest 45 minutes following the intravenous administration of Technetium 60m Sestamibi. Rest ECG: Down-sloaping STD in lead II and III consistent with ischemia.  Stress Procedure:  The patient received IV Lexiscan 0.4 mg  over 15-seconds with concurrent low level exercise and then Technetium 62m Sestamibi was injected at 30-seconds while the patient continued walking one more minute.  Quantitative spect images were obtained after a 45-minute delay.  During the infusion of Lexiscan, the patient complained of chest tightness.  This symptom began to resolve in recovery.  Stress ECG: No significant change from baseline ECG  QPS Raw Data Images:  There is interference from nuclear activity from structures below the diaphragm. This does affect the ability to read the study. Stress Images:  There is medium size severe severity perfusion defect in the entire inferior wall.  Rest Images:  Perfusion defect in the basal inferior wall.  Subtraction (SDS):  There is a small scar in the basal inferior wall with moderate periinfarct ischemia in the mid and apical inferior wall. SDS 8.   Transient Ischemic Dilatation (Normal <1.22):  0.90 Lung/Heart Ratio (Normal <0.45):  0.33  Quantitative Gated Spect Images QGS EDV:  84 ml QGS ESV:  38 ml  Impression Exercise Capacity:  Lexiscan with low level exercise. BP Response:  Normal blood pressure response. Clinical Symptoms:  Typical chest pain. ECG Impression:  Significant ST abnormalities consistent with ischemia. Comparison with Prior Nuclear Study: This is a change when compared to the report from 2008.  Overall Impression:  Intermediate risk stress nuclear study  with medium size severe ischemia in the RCA/PDA territory. .  LV Ejection Fraction: 54%.  LV Wall Motion:  Hypokinesis of the basal inferior wall.   Curtis Galloway, Curtis Galloway 11/02/2013

## 2013-11-11 ENCOUNTER — Encounter (HOSPITAL_COMMUNITY): Payer: Self-pay | Admitting: Pharmacy Technician

## 2013-11-11 ENCOUNTER — Telehealth: Payer: Self-pay | Admitting: *Deleted

## 2013-11-11 ENCOUNTER — Other Ambulatory Visit: Payer: Self-pay | Admitting: *Deleted

## 2013-11-11 ENCOUNTER — Encounter: Payer: Self-pay | Admitting: *Deleted

## 2013-11-11 DIAGNOSIS — Z01810 Encounter for preprocedural cardiovascular examination: Secondary | ICD-10-CM

## 2013-11-11 NOTE — Telephone Encounter (Signed)
PT'S WIFE  AWARE    CATH TIME CHANGED  SO PT  PT NEEDS  TO  BE  AT  SHORT  STAY   AT  10;00  AND  DR COOPER  IS DOING PROCEDURE .Adonis Housekeeper

## 2013-11-12 ENCOUNTER — Other Ambulatory Visit (INDEPENDENT_AMBULATORY_CARE_PROVIDER_SITE_OTHER): Payer: Commercial Managed Care - HMO

## 2013-11-12 DIAGNOSIS — Z01810 Encounter for preprocedural cardiovascular examination: Secondary | ICD-10-CM

## 2013-11-12 LAB — BASIC METABOLIC PANEL
BUN: 14 mg/dL (ref 6–23)
CHLORIDE: 110 meq/L (ref 96–112)
CO2: 29 mEq/L (ref 19–32)
Calcium: 9.3 mg/dL (ref 8.4–10.5)
Creatinine, Ser: 1.1 mg/dL (ref 0.4–1.5)
GFR: 68.68 mL/min (ref >60.00)
Glucose, Bld: 108 mg/dL — ABNORMAL HIGH (ref 70–99)
Potassium: 4.4 mEq/L (ref 3.5–5.1)
Sodium: 145 mEq/L (ref 135–145)

## 2013-11-12 LAB — CBC WITH DIFFERENTIAL/PLATELET
Basophils Absolute: 0.1 10*3/uL (ref 0.0–0.1)
Basophils Relative: 0.6 % (ref 0.0–3.0)
EOS ABS: 0.1 10*3/uL (ref 0.0–0.7)
EOS PCT: 1.2 % (ref 0.0–5.0)
HCT: 41.3 % (ref 39.0–52.0)
HEMOGLOBIN: 13.3 g/dL (ref 13.0–17.0)
LYMPHS PCT: 18.4 % (ref 12.0–46.0)
Lymphs Abs: 1.6 10*3/uL (ref 0.7–4.0)
MCHC: 32.3 g/dL (ref 30.0–36.0)
MCV: 90.2 fl (ref 78.0–100.0)
Monocytes Absolute: 0.7 10*3/uL (ref 0.1–1.0)
Monocytes Relative: 7.6 % (ref 3.0–12.0)
NEUTROS ABS: 6.5 10*3/uL (ref 1.4–7.7)
NEUTROS PCT: 72.2 % (ref 43.0–77.0)
Platelets: 290 10*3/uL (ref 150.0–400.0)
RBC: 4.57 Mil/uL (ref 4.22–5.81)
RDW: 15.3 % — ABNORMAL HIGH (ref 11.5–14.6)
WBC: 9 10*3/uL (ref 4.5–10.5)

## 2013-11-12 LAB — PROTIME-INR
INR: 1.2 ratio — ABNORMAL HIGH (ref 0.8–1.0)
Prothrombin Time: 12.6 s — ABNORMAL HIGH (ref 10.2–12.4)

## 2013-11-15 ENCOUNTER — Encounter (HOSPITAL_COMMUNITY)
Admission: RE | Disposition: A | Discharge status: Home / Self Care | Payer: Self-pay | Source: Ambulatory Visit | Attending: Cardiovascular Disease

## 2013-11-15 ENCOUNTER — Ambulatory Visit (HOSPITAL_COMMUNITY)
Admission: RE | Admit: 2013-11-15 | Discharge: 2013-11-16 | Disposition: A | Discharge status: Home / Self Care | Payer: Medicare HMO | Source: Ambulatory Visit | Attending: Cardiovascular Disease | Admitting: Cardiovascular Disease

## 2013-11-15 ENCOUNTER — Encounter (HOSPITAL_COMMUNITY): Payer: Self-pay | Admitting: General Practice

## 2013-11-15 DIAGNOSIS — R9439 Abnormal result of other cardiovascular function study: Secondary | ICD-10-CM | POA: Diagnosis present

## 2013-11-15 DIAGNOSIS — I10 Essential (primary) hypertension: Secondary | ICD-10-CM | POA: Diagnosis present

## 2013-11-15 DIAGNOSIS — F17200 Nicotine dependence, unspecified, uncomplicated: Secondary | ICD-10-CM | POA: Insufficient documentation

## 2013-11-15 DIAGNOSIS — Z7982 Long term (current) use of aspirin: Secondary | ICD-10-CM | POA: Insufficient documentation

## 2013-11-15 DIAGNOSIS — I2510 Atherosclerotic heart disease of native coronary artery without angina pectoris: Secondary | ICD-10-CM

## 2013-11-15 DIAGNOSIS — Z7902 Long term (current) use of antithrombotics/antiplatelets: Secondary | ICD-10-CM | POA: Insufficient documentation

## 2013-11-15 DIAGNOSIS — I25810 Atherosclerosis of coronary artery bypass graft(s) without angina pectoris: Secondary | ICD-10-CM | POA: Insufficient documentation

## 2013-11-15 DIAGNOSIS — K219 Gastro-esophageal reflux disease without esophagitis: Secondary | ICD-10-CM | POA: Insufficient documentation

## 2013-11-15 DIAGNOSIS — E785 Hyperlipidemia, unspecified: Secondary | ICD-10-CM | POA: Diagnosis present

## 2013-11-15 DIAGNOSIS — I200 Unstable angina: Secondary | ICD-10-CM | POA: Diagnosis present

## 2013-11-15 HISTORY — PX: CORONARY ANGIOPLASTY WITH STENT PLACEMENT: SHX49

## 2013-11-15 HISTORY — PX: LEFT HEART CATHETERIZATION WITH CORONARY/GRAFT ANGIOGRAM: SHX5450

## 2013-11-15 HISTORY — DX: Abnormal result of other cardiovascular function study: R94.39

## 2013-11-15 LAB — POCT ACTIVATED CLOTTING TIME
Activated Clotting Time: 199 seconds
Activated Clotting Time: 343 seconds

## 2013-11-15 SURGERY — LEFT HEART CATHETERIZATION WITH CORONARY/GRAFT ANGIOGRAM
Anesthesia: LOCAL

## 2013-11-15 MED ORDER — HEPARIN (PORCINE) IN NACL 2-0.9 UNIT/ML-% IJ SOLN
INTRAMUSCULAR | Status: AC
  Filled 2013-11-15: qty 1500

## 2013-11-15 MED ORDER — NITROGLYCERIN 0.2 MG/ML ON CALL CATH LAB
INTRAVENOUS | Status: AC
  Filled 2013-11-15: qty 1

## 2013-11-15 MED ORDER — FENTANYL CITRATE 0.05 MG/ML IJ SOLN
INTRAMUSCULAR | Status: AC
  Filled 2013-11-15: qty 2

## 2013-11-15 MED ORDER — LABETALOL HCL 5 MG/ML IV SOLN
20.0000 mg | Freq: Once | INTRAVENOUS | Status: AC
  Administered 2013-11-15: 17:00:00 20 mg via INTRAVENOUS
  Filled 2013-11-15: qty 4

## 2013-11-15 MED ORDER — SODIUM CHLORIDE 0.9 % IJ SOLN: 3.0000 mL | INTRAMUSCULAR | Status: DC | PRN

## 2013-11-15 MED ORDER — SODIUM CHLORIDE 0.9 % IV SOLN
INTRAVENOUS | Status: DC
  Administered 2013-11-15: 11:00:00 via INTRAVENOUS

## 2013-11-15 MED ORDER — ASPIRIN 81 MG PO CHEW
81.0000 mg | CHEWABLE_TABLET | ORAL | Status: AC
  Administered 2013-11-15: 81 mg via ORAL
  Filled 2013-11-15: qty 1

## 2013-11-15 MED ORDER — SODIUM CHLORIDE 0.9 % IV SOLN
INTRAVENOUS | Status: AC
  Administered 2013-11-15: 15:00:00 via INTRAVENOUS

## 2013-11-15 MED ORDER — ATORVASTATIN CALCIUM 80 MG PO TABS
80.0000 mg | ORAL_TABLET | Freq: Every day | ORAL | Status: DC
  Administered 2013-11-15: 80 mg via ORAL
  Filled 2013-11-15 (×2): qty 1

## 2013-11-15 MED ORDER — SODIUM CHLORIDE 0.9 % IV SOLN: 250.0000 mL | INTRAVENOUS | Status: DC | PRN

## 2013-11-15 MED ORDER — ACETAMINOPHEN 325 MG PO TABS: 650.0000 mg | ORAL_TABLET | ORAL | Status: DC | PRN

## 2013-11-15 MED ORDER — OXYCODONE-ACETAMINOPHEN 5-325 MG PO TABS
1.0000 | ORAL_TABLET | Freq: Four times a day (QID) | ORAL | Status: DC | PRN
  Administered 2013-11-15: 1 via ORAL
  Filled 2013-11-15: qty 1

## 2013-11-15 MED ORDER — MIDAZOLAM HCL 2 MG/2ML IJ SOLN
INTRAMUSCULAR | Status: AC
  Filled 2013-11-15: qty 2

## 2013-11-15 MED ORDER — CLOPIDOGREL BISULFATE 300 MG PO TABS
ORAL_TABLET | ORAL | Status: AC
  Filled 2013-11-15: qty 1

## 2013-11-15 MED ORDER — CLOPIDOGREL BISULFATE 75 MG PO TABS
75.0000 mg | ORAL_TABLET | Freq: Every day | ORAL | Status: DC
  Administered 2013-11-16: 08:00:00 75 mg via ORAL
  Filled 2013-11-15: qty 1

## 2013-11-15 MED ORDER — NITROGLYCERIN 0.4 MG SL SUBL: 0.4000 mg | SUBLINGUAL_TABLET | SUBLINGUAL | Status: DC | PRN

## 2013-11-15 MED ORDER — ASPIRIN EC 81 MG PO TBEC
81.0000 mg | DELAYED_RELEASE_TABLET | Freq: Every day | ORAL | Status: DC
  Administered 2013-11-16: 81 mg via ORAL
  Filled 2013-11-15: qty 1

## 2013-11-15 MED ORDER — METOPROLOL SUCCINATE ER 50 MG PO TB24
50.0000 mg | ORAL_TABLET | Freq: Every day | ORAL | Status: DC
  Administered 2013-11-16: 09:00:00 50 mg via ORAL
  Filled 2013-11-15: qty 1

## 2013-11-15 MED ORDER — LIDOCAINE HCL (PF) 1 % IJ SOLN
INTRAMUSCULAR | Status: AC
  Filled 2013-11-15: qty 30

## 2013-11-15 MED ORDER — HEPARIN SODIUM (PORCINE) 1000 UNIT/ML IJ SOLN
INTRAMUSCULAR | Status: AC
  Filled 2013-11-15: qty 1

## 2013-11-15 MED ORDER — SODIUM CHLORIDE 0.9 % IJ SOLN: 3.0000 mL | Freq: Two times a day (BID) | INTRAMUSCULAR | Status: DC

## 2013-11-15 MED ORDER — VERAPAMIL HCL 2.5 MG/ML IV SOLN
INTRAVENOUS | Status: AC
Start: 2013-11-15 — End: 2013-11-15
  Filled 2013-11-15: qty 2

## 2013-11-15 MED ORDER — LABETALOL HCL 5 MG/ML IV SOLN
10.0000 mg | INTRAVENOUS | Status: DC | PRN
  Administered 2013-11-15: 10 mg via INTRAVENOUS
  Filled 2013-11-15 (×2): qty 4

## 2013-11-15 NOTE — H&P (View-Only) (Signed)
Patient ID: Curtis Galloway, male   DOB: [birth year], 73 y.o.   MRN: [id number] 73 yo recently hospitalized for SSCP. Cath showed occluded grafts but suitable for medical Rx  On prilosec for reflux. Needs P2Y test. No angina. Wallks daily. Intolerant to nitrates with headache  Did not want to be on Effient due to cost. Compliant with meds  Right dominant with no anomalies Cath 11/01/11  LM: 30%   LAD: 50-60% proximal LAD just after a small diffusely diseased D1. Mid vessel 30% disease. Distal vessel is tented from LIMA with minor disease D2 100% IM: 40-50% medium size vessel  Circumflex: 40% mid. Large OM1 30-40% multiple discrete lesions. AV circ occluded  RCA: 100% ostial occlusion with only filling of RV/SA branch. Occluded proximal stents. Extensive Left to right collaterals to RCA  Grafts:  SVG: to PDA occluded New since 2003  SVG: Diagonal occluded New since 2003  Free Rima: Came off diagonal graft atretic in 2003  [place]: Known to be occluded cath 2003  Hemodynamics:  Aortic Pressure: 134 75mmHg LV Pressure: 135 18 mmHg  Impression: Despite all his grafts being occluded circulation appears stable for medical Rx. Proximal LAD similar to 2003. Collaterals to RCA  Add Effient and nitrates. D/C in am for outpatient F/U Enzymes negative so far and no acute ECG changes. Check Cr in am  D/C with Effient and nitrates. Severe headache with imdur stopped. No angina, dyspnea or palpitaitons  10/20/12  LFT;s were normal on high dose statins  Did not see Dr Linna Darner last year  [facility] surgery with [facility] in September  Still chewing long leaf tobacco   Some sharp chest pains with ambulation not always exertional Some dyspnea No cough sputum or fever Has not taken nitro needs new script.  Pain left sided no radiation to arm or neck   ROS: Denies fever, malais, weight loss, blurry vision, decreased visual acuity, cough, sputum, SOB, hemoptysis, pleuritic pain, palpitaitons, heartburn, abdominal pain, melena,  lower extremity edema, claudication, or rash.  All other systems reviewed and negative  General: Affect appropriate Chronically ill white male  HEENT: normal Neck supple with no adenopathy JVP normal no bruits no thyromegaly Lungs faint interstitial crackles no wheezing and good diaphragmatic motion Heart:  S1/S2 no murmur, no rub, gallop or click PMI normal Abdomen: benighn, BS positve, no tenderness, no AAA no bruit.  No HSM or HJR Distal pulses intact with no bruits No edema Neuro non-focal Skin warm and dry No muscular weakness   Current Outpatient Prescriptions  Medication Sig Dispense Refill  . aspirin 81 MG tablet Take 81 mg by mouth daily.        Marland Kitchen atorvastatin (LIPITOR) 80 MG tablet Take 1 tablet (80 mg total) by mouth at bedtime.  90 tablet  3  . clopidogrel (PLAVIX) 75 MG tablet Take 75 mg by mouth daily.      . fish oil-omega-3 fatty acids 1000 MG capsule Take 2 g by mouth daily.       . metoprolol succinate (TOPROL-XL) 50 MG 24 hr tablet Take 1 tablet (50 mg total) by mouth daily.  90 tablet  3  . Multiple Vitamin (MULTIVITAMIN) tablet Take 1 tablet by mouth daily.        Marland Kitchen omeprazole (PRILOSEC) 20 MG capsule Take 20 mg by mouth as needed. Acid reflux       No current facility-administered medications for this visit.    Allergies  Codeine and Tramadol  Electrocardiogram:  SR rate  65 nonspecific ST/T wave changes   Assessment and Plan

## 2013-11-15 NOTE — Progress Notes (Signed)
TR BAND REMOVAL  LOCATION:    right radial  DEFLATED PER PROTOCOL:    yes  TIME BAND OFF / DRESSING APPLIED:    20:00   SITE UPON ARRIVAL:    Level 0  SITE AFTER BAND REMOVAL:    Level 0  REVERSE ALLEN'S TEST:     positive  CIRCULATION SENSATION AND MOVEMENT:    Within Normal Limits   yes  COMMENTS:

## 2013-11-15 NOTE — Interval H&P Note (Signed)
History and Physical Interval Note:  11/15/2013 2:01 PM  Curtis Galloway  has presented today for cardiac cath with the diagnosis of chest pain, CAD, Abnormal stress Myoview  The various methods of treatment have been discussed with the patient and family. After consideration of risks, benefits and other options for treatment, the patient has consented to  Procedure(s): LEFT HEART CATHETERIZATION WITH CORONARY/GRAFT ANGIOGRAM (N/A) as a surgical intervention .  The patient's history has been reviewed, patient examined, no change in status, stable for surgery.  I have reviewed the patient's chart and labs.  Questions were answered to the patient's satisfaction.    Cath Lab Visit (complete for each Cath Lab visit)  Clinical Evaluation Leading to the Procedure:   ACS: no  Non-ACS:    Anginal Classification: CCS III  Anti-ischemic medical therapy: Minimal Therapy (1 class of medications)  Non-Invasive Test Results: Intermediate-risk stress test findings: cardiac mortality 1-3%/year  Prior CABG: Previous CABG        Curtis Galloway

## 2013-11-15 NOTE — CV Procedure (Signed)
Cardiac Catheterization Operative Report  SASUKE YAFFE [id number] 2/16/20152:56 PM Unice Cobble, MD  Procedure Performed:  1. Left Heart Catheterization 2. Selective Coronary Angiography 3. Left ventricular angiogram 4. PTCA/DES x 1 proximal LAD  Operator: Lauree Chandler, MD  Arterial access site:  Right radial artery.   Indication: 73 yo male with history of CAD s/p 4V CABG in the late 1990s, last cath February 2013 with all grafts occluded. He had moderate proximal LAD disease, occluded proximal RCA and moderate Circumflex disease at that time. Recent dyspnea and chest pain c/w class III unstable angina. Stress myoview intermediate risk with inferior wall ischemia.                                      Procedure Details: The risks, benefits, complications, treatment options, and expected outcomes were discussed with the patient. The patient and/or family concurred with the proposed plan, giving informed consent. The patient was brought to the cath lab after IV hydration was begun and oral premedication was given. The patient was further sedated with Versed and Fentanyl. The right wrist was assessed with an Allens test which was positive. The right wrist was prepped and draped in a sterile fashion. 1% lidocaine was used for local anesthesia. Using the modified Seldinger access technique, a 5 French sheath was placed in the right radial artery. 3 mg Verapamil was given through the sheath. 4000 units IV heparin was given. Standard diagnostic catheters were used to perform selective coronary angiography. A pigtail catheter was used to perform a left ventricular angiogram. The grafts are known to be occluded and were not engaged. He was found to have severe stenosis in the proximal LAD. I elected to proceed to PCI of the proximal LAD.   PCI Note: Lesion #1 (proximal LAD): The ACT was 199. He was given an additional 4000 Units IV heparin x 1. Follow up ACT was 343. I engaged the  left main with a XB LAD 3.5 guiding catheter. I then passed a Cougar IC wire down the LAD. A 2.5 x 12 mm balloon was inflated x 2 in the proximal LAD. I then carefully positioned and deployed a 3.5 x 12 mm Promus Premier DES in the proximal LAD. The stent was post-dilated with a 3.5 x 8 mm [place] balloon x 1. The stenosis was taken from 90% down to 0%. The sheath was removed from the right radial artery and a Terumo hemostasis band was applied at the arteriotomy site on the right wrist.   There were no immediate complications. The patient was taken to the recovery area in stable condition.   Hemodynamic Findings: Central aortic pressure: 147/66 Left ventricular pressure: 146/4/15  Angiographic Findings:  Left main: 30% distal stenosis.   Left Anterior Descending Artery: Large caliber vessel that courses to the apex. The proximal vessel has a hazy 90% stenosis. The mid and distal vessel has mild diffuse plaque. There is a small caliber diagonal branch with mild plaque. The second diagonal branch is occluded.   Circumflex Artery: Large caliber vessel with 30% mid stenosis. The first OM branch is moderate in caliber with 30% stenosis. The intermediate branch is moderate in caliber with diffuse 30% stenosis.   Right Coronary Artery: 100% ostial occlusion. The distal vessel fills from left to right collaterals.   Graft Anatomy:  All grafts are known to be occluded and were not selectively  engaged. (SVG to PDA, SVG to Diagonal, LIMA to LAD, Free RIMA to ? Diagonal per reports)  Left Ventricular Angiogram: LVEF=50-55% with akinesis of the inferobasal wall.   Impression: 1. 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  2. Unstable angina secondary to severe hazy stenosis in proximal LAD 3. Successful PTCA/DES x 1 proximal LAD 4. Preserved LV systolic function with  akinesis of the inferior wall at the base  Recommendations: Will continue ASA and Plavix for lifetime. Discharge home am of 2.17/15 if stable. F/U Nishan.        Complications:  None. The patient tolerated the procedure well.

## 2013-11-16 ENCOUNTER — Encounter (HOSPITAL_COMMUNITY): Payer: Self-pay | Admitting: Cardiology

## 2013-11-16 DIAGNOSIS — R9439 Abnormal result of other cardiovascular function study: Secondary | ICD-10-CM

## 2013-11-16 DIAGNOSIS — I200 Unstable angina: Secondary | ICD-10-CM

## 2013-11-16 DIAGNOSIS — I2510 Atherosclerotic heart disease of native coronary artery without angina pectoris: Secondary | ICD-10-CM | POA: Diagnosis not present

## 2013-11-16 HISTORY — DX: Abnormal result of other cardiovascular function study: R94.39

## 2013-11-16 LAB — CBC
HCT: 34.6 % — ABNORMAL LOW (ref 39.0–52.0)
Hemoglobin: 11.8 g/dL — ABNORMAL LOW (ref 13.0–17.0)
MCH: 30.2 pg (ref 26.0–34.0)
MCHC: 34.1 g/dL (ref 30.0–36.0)
MCV: 88.5 fL (ref 78.0–100.0)
Platelets: 232 10*3/uL (ref 150–400)
RBC: 3.91 MIL/uL — AB (ref 4.22–5.81)
RDW: 14.8 % (ref 11.5–15.5)
WBC: 9 10*3/uL (ref 4.0–10.5)

## 2013-11-16 LAB — BASIC METABOLIC PANEL
BUN: 16 mg/dL (ref 6–23)
CHLORIDE: 105 meq/L (ref 96–112)
CO2: 25 meq/L (ref 19–32)
CREATININE: 1.07 mg/dL (ref 0.50–1.35)
Calcium: 8.5 mg/dL (ref 8.4–10.5)
GFR calc Af Amer: 79 mL/min — ABNORMAL LOW (ref >90)
GFR calc non Af Amer: 68 mL/min — ABNORMAL LOW (ref >90)
Glucose, Bld: 101 mg/dL — ABNORMAL HIGH (ref 70–99)
Potassium: 4 mEq/L (ref 3.7–5.3)
Sodium: 141 mEq/L (ref 137–147)

## 2013-11-16 NOTE — Discharge Summary (Signed)
The patient has ambulated, is free of discomfort, and has no access site complications. He will continue admission meds, including life long DAPT. F/U with Dr. Johnsie Cancel in 1-2 weeks. PCP is [facility]. Plan discharge today.

## 2013-11-16 NOTE — Progress Notes (Signed)
CARDIAC REHAB PHASE I   PRE:  Rate/Rhythm: 77 SR  BP:  Sitting: 156/57     SaO2: 97% ra  MODE:  Ambulation: 600 ft   POST:  Rate/Rhythm: 79 SR  BP:  Sitting: 180/56 RECHECK 154/81     SaO2: 96%  8:10AM-8:45AM Patient ambulated well.  Elevated blood pressure at the end of the walk.  Recheck after 5 minutes and it was back down to baseline.  Patient did not need any assistive devices. Curtis Galloway states that he is not interested in Cardiac Rehab at this time because he states that his heart is healthy.  I will hold off on education until wife arrives later this morning and inquire about Cardiac Rehab again.  Patient is in chair with call bell in reach eating his breakfast.    Vita Erm, MS 11/16/2013 8:39 AM

## 2013-11-16 NOTE — Discharge Summary (Signed)
Physician Discharge Summary       Patient ID: Curtis Galloway MRN: [id number] DOB/AGE: 71/22/1942 71 y.o.  Admit date: 11/15/2013 Discharge date: 11/16/2013  Discharge Diagnoses:  Principal Problem:   Unstable angina Active Problems:   Coronary atherosclerosis of native coronary artery, 11/15/13 PTCA/DES prox LAD, Promus   HYPERLIPIDEMIA   Essential hypertension, benign   Abnormal nuclear stress test, 11/10/13   Discharged Condition: good  Procedures: 11/15/12 cardiac cath by Dr. Angelena Form 11/15/13  PTCA/stent DES to prox LAD by Dr. [facility] Course:  73 yo recently hospitalized for SSCP. Cath showed occluded grafts but suitable for medical Rx .  On prilosec for reflux. Needs P2Y test. No angina. Wallks daily. Intolerant to nitrates with headache  Did not want to be on Effient due to cost. Compliant with meds   Pt underwent lexiscan myoview which revealed intermediate risk with medium size severe ischemia in the RCA/PDA territory.  EF 54%. Plans were made for elective cath and PCI.   Cath revealed:    1. 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  2. Unstable angina secondary to severe hazy stenosis in proximal LAD  3. Successful PTCA/DES x 1 proximal LAD  4. Preserved LV systolic function with akinesis of the inferior wall at the base   Pt tolerated procedure without complications and by the next AM seen by Dr. Tamala Julian and found stable and ready for discharge.  Pt on ASA and Plavix.  He will follow up with Dr Johnsie Cancel in 1-2 weeks.  Consults: None  Significant Diagnostic Studies:  BMET    Component Value Date/Time   NA 141 11/16/2013 0533   K 4.0 11/16/2013 0533   CL 105 11/16/2013 0533   CO2 25 11/16/2013 0533   GLUCOSE 101* 11/16/2013 0533   BUN 16 11/16/2013 0533   CREATININE 1.07 11/16/2013 0533   CREATININE 1.20 08/07/2011 1230    CALCIUM 8.5 11/16/2013 0533   GFRNONAA 68* 11/16/2013 0533   GFRAA 79* 11/16/2013 0533    CBC    Component Value Date/Time   WBC 9.0 11/16/2013 0533   RBC 3.91* 11/16/2013 0533   HGB 11.8* 11/16/2013 0533   HCT 34.6* 11/16/2013 0533   PLT 232 11/16/2013 0533   MCV 88.5 11/16/2013 0533   MCH 30.2 11/16/2013 0533   MCHC 34.1 11/16/2013 0533   RDW 14.8 11/16/2013 0533   LYMPHSABS 1.6 11/12/2013 0823   MONOABS 0.7 11/12/2013 0823   EOSABS 0.1 11/12/2013 0823   BASOSABS 0.1 11/12/2013 0823       Discharge Exam: Blood pressure 134/55, pulse 72, temperature 97.7 F (36.5 C), temperature source Oral, resp. rate 20, height 5\' 9"  (1.753 m), weight 171 lb 1.2 oz (77.6 kg), SpO2 99.00%.   Disposition: 01-Home or Self Care       Future Appointments Provider Department Dept Phone   11/18/2013 10:30 AM Hendricks Limes, MD [facility] [phone]       Medication List    STOP taking these medications       omeprazole 20 MG capsule  Commonly known as:  PRILOSEC      TAKE these medications       aspirin EC 81 MG tablet  Take 81 mg by mouth daily.     atorvastatin 80 MG tablet  Commonly known as:  LIPITOR  Take  80 mg by mouth daily.     clopidogrel 75 MG tablet  Commonly known as:  PLAVIX  Take 75 mg by mouth daily with breakfast.     fish oil-omega-3 fatty acids 1000 MG capsule  Take 2 g by mouth daily.     metoprolol succinate 50 MG 24 hr tablet  Commonly known as:  TOPROL-XL  Take 50 mg by mouth daily. Take with or immediately following a meal.     multivitamin tablet  Take 1 tablet by mouth daily.     nitroGLYCERIN 0.4 MG SL tablet  Commonly known as:  NITROSTAT  Place 0.4 mg under the tongue every 5 (five) minutes as needed for chest pain.       Follow-up Information   Follow up with Jenkins Rouge, MD. (our office will call with appt date and time)    Specialty:  Cardiology   Contact information:   [address] [place] [address] [phone]        Discharge Instructions:  Call [facility]  [phone] if any bleeding, swelling or drainage at cath site.  May shower, no tub baths for 48 hours for groin sticks.  No lifting over 8 pounds for 3 days No driving for 3 days. Heart healthy diet.  Continue plavix. Signed: Isaiah Serge Nurse Practitioner-Certified [facility]: [facility] 11/16/2013, 2:44 PM  Time spent on discharge : >30 min minutes.

## 2013-11-16 NOTE — Progress Notes (Signed)
Educated patient, wife, and son and also encouraged cardiac rehab again.  Wife wants patient to attend rehab but patient states he wants to follow up with his cardiologist before he decides.

## 2013-11-16 NOTE — Discharge Instructions (Signed)
Call [facility]  [phone] if any bleeding, swelling or drainage at cath site.  May shower, no tub baths for 48 hours for groin sticks.  No lifting over 8 pounds for 3 days No driving for 3 days. Heart healthy diet.  Continue plavix.

## 2013-11-17 NOTE — Discharge Summary (Signed)
The patient was seen, the database reviewed, and the patient was examined. We reviewed post PCI progression. We reviewed the importance of dual antiplatelet therapy. At this time he was felt eligible for discharge with followup in 7-10 days with Dr. Johnsie Cancel.

## 2013-11-18 ENCOUNTER — Encounter: Payer: Medicare HMO | Admitting: Internal Medicine

## 2013-12-03 ENCOUNTER — Encounter: Payer: Self-pay | Admitting: Cardiovascular Disease

## 2013-12-03 ENCOUNTER — Ambulatory Visit (INDEPENDENT_AMBULATORY_CARE_PROVIDER_SITE_OTHER): Payer: Commercial Managed Care - HMO | Admitting: Cardiovascular Disease

## 2013-12-03 VITALS — BP 124/82 | HR 80 | Ht 69.0 in | Wt 169.0 lb

## 2013-12-03 DIAGNOSIS — I10 Essential (primary) hypertension: Secondary | ICD-10-CM

## 2013-12-03 DIAGNOSIS — I2510 Atherosclerotic heart disease of native coronary artery without angina pectoris: Secondary | ICD-10-CM

## 2013-12-03 DIAGNOSIS — E785 Hyperlipidemia, unspecified: Secondary | ICD-10-CM

## 2013-12-03 NOTE — Assessment & Plan Note (Signed)
Cholesterol is at goal.  Continue current dose of statin and diet Rx.  No myalgias or side effects.  F/U  LFT's in 6 months. Lab Results  Component Value Date   LDLCALC 88 10/20/2013

## 2013-12-03 NOTE — Assessment & Plan Note (Signed)
Well controlled.  Continue current medications and low sodium Dash type diet.    

## 2013-12-03 NOTE — Progress Notes (Signed)
Patient ID: Curtis Galloway, male   DOB: 1941/01/10, 73 y.o.   MRN: [id number] 73 yo recently hospitalized for SSCP. Cath showed occluded grafts but suitable for medical Rx . On prilosec for reflux. Needs P2Y test. No angina. Wallks daily. Intolerant to nitrates with headache  Did not want to be on Effient due to cost. Compliant with meds  Pt underwent lexiscan myoview which revealed intermediate risk with medium size severe ischemia in the RCA/PDA territory. EF 54%. Plans were made for elective cath and PCI.   Cath revealed:  1. 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  2. Unstable angina secondary to severe hazy stenosis in proximal LAD  3. Successful PTCA/DES x 1 proximal LAD  4. Preserved LV systolic function with akinesis of the inferior wall at the base   Grafts:  SVG: to PDA occluded New since 2003  SVG: Diagonal occluded New since 2003  Free Rima: Came off diagonal graft atretic in 2003  [place]: Known to be occluded cath 2003   Doing well since d/c  Needs stomach meds not taking prilosec since d/c but antacids not enough for his reflux     ROS: Denies fever, malais, weight loss, blurry vision, decreased visual acuity, cough, sputum, SOB, hemoptysis, pleuritic pain, palpitaitons, heartburn, abdominal pain, melena, lower extremity edema, claudication, or rash.  All other systems reviewed and negative  General: Affect appropriate Healthy:  appears stated age 73: normal Neck supple with no adenopathy JVP normal no bruits no thyromegaly Lungs clear with no wheezing and good diaphragmatic motion Heart:  S1/S2 no murmur, no rub, gallop or click PMI normal Abdomen: benighn, BS positve, no tenderness, no AAA no bruit.  No HSM or HJR Distal pulses intact with no bruits No edema Neuro non-focal Skin warm and dry No muscular  weakness   Current Outpatient Prescriptions  Medication Sig Dispense Refill  . aspirin EC 81 MG tablet Take 81 mg by mouth daily.      Marland Kitchen atorvastatin (LIPITOR) 80 MG tablet Take 80 mg by mouth daily.      . clopidogrel (PLAVIX) 75 MG tablet Take 75 mg by mouth daily with breakfast.      . fish oil-omega-3 fatty acids 1000 MG capsule Take 2 g by mouth daily.       . metoprolol succinate (TOPROL-XL) 50 MG 24 hr tablet Take 50 mg by mouth daily. Take with or immediately following a meal.      . Multiple Vitamin (MULTIVITAMIN) tablet Take 1 tablet by mouth daily.        . nitroGLYCERIN (NITROSTAT) 0.4 MG SL tablet Place 0.4 mg under the tongue every 5 (five) minutes as needed for chest pain.       No current facility-administered medications for this visit.    Allergies  Codeine and Tramadol  Electrocardiogram:  SR rate 96 nonspecific ST/T wave changes   Assessment and Plan

## 2013-12-03 NOTE — Assessment & Plan Note (Signed)
Occluded grafts with collaterals to native RCA and recent stent to proximal LAD Check P2Y and they were given samples of Brillinta and Effient to check cost He thinks he will need to be on Proton pump inhibitor

## 2013-12-03 NOTE — Patient Instructions (Signed)
Your physician recommends that you schedule a follow-up appointment in: [place] Your physician recommends that you continue on your current medications as directed. Please refer to the Current Medication list given to you today. Your physician recommends that you return for lab work in: TODAY    P2Y  DX  V58.69

## 2013-12-06 ENCOUNTER — Encounter: Payer: Self-pay | Admitting: Internal Medicine

## 2013-12-06 ENCOUNTER — Ambulatory Visit (INDEPENDENT_AMBULATORY_CARE_PROVIDER_SITE_OTHER): Payer: Medicare HMO | Admitting: Internal Medicine

## 2013-12-06 VITALS — BP 140/70 | HR 79 | Temp 97.6°F | Ht 69.5 in | Wt 168.2 lb

## 2013-12-06 DIAGNOSIS — I2510 Atherosclerotic heart disease of native coronary artery without angina pectoris: Secondary | ICD-10-CM

## 2013-12-06 DIAGNOSIS — E1151 Type 2 diabetes mellitus with diabetic peripheral angiopathy without gangrene: Secondary | ICD-10-CM | POA: Insufficient documentation

## 2013-12-06 DIAGNOSIS — E1159 Type 2 diabetes mellitus with other circulatory complications: Secondary | ICD-10-CM

## 2013-12-06 DIAGNOSIS — C649 Malignant neoplasm of unspecified kidney, except renal pelvis: Secondary | ICD-10-CM

## 2013-12-06 DIAGNOSIS — E785 Hyperlipidemia, unspecified: Secondary | ICD-10-CM

## 2013-12-06 DIAGNOSIS — Z0000 Encounter for general adult medical examination without abnormal findings: Secondary | ICD-10-CM

## 2013-12-06 NOTE — Progress Notes (Signed)
Pre visit review using our clinic review tool, if applicable. No additional management support is needed unless otherwise documented below in the visit note. 

## 2013-12-06 NOTE — Patient Instructions (Signed)
Follow a low carb nutrition program such as  The New Sugar Busters as closely as possible to prevent Diabetes progression & complications.  White carbohydrates (potatoes, rice, bread, and pasta) cause a high spike of the sugar level which stays elevated for a significant period of time (called sugar"load").  For example a  baked potato has a cup of sugar and a  french fry  2 teaspoons of sugar.  More complex carbs such as yams, wild  rice, whole grained bread &  wheat pasta have been much lower spike and persistent load of sugar than the white carbs. The pancreas excretes excess insulin in response to the high spike & load of sugar . Over time the pancreas can actually run out of insulin necessitating insulin shots. Check A1c in 4-6 months

## 2013-12-06 NOTE — Progress Notes (Signed)
Subjective:    Patient ID: Curtis Galloway, male    DOB: 04-30-1941, 73 y.o.   MRN: [id number]  HPI Medicare Wellness Visit: Psychosocial and medical history were reviewed as required by Medicare (history related to abuse, antisocial behavior , firearm risk). Social history: Caffeine: 1 pots / day , Alcohol:no  , Tobacco GYF:VCBS 1998 Exercise:see below Personal safety/fall risk:no Limitations of activities of daily living:no Seatbelt/ smoke alarm use:yes Healthcare Power of Attorney/Living Will status: in place Ophthalmologic exam status:pending Hearing evaluation status:not UTD Orientation: Oriented X 3 Memory and recall: good Spelling or math testing: poor Depression/anxiety assessment: no Foreign travel history:never Immunization status for influenza/pneumonia/ shingles /tetanus: ? UTD except tetanus Transfusion history:with CBAG 2000 Preventive health care maintenance status: Colonoscopy as per protocol/standard care:UTD Dental care:dentures Chart reviewed and updated. Active issues reviewed and addressed as documented below.    Review of Systems A heart healthy diet is followed; exercise encompasses 15-30 minutes  6 times per week as  As walking without symptoms.  Family history is strongly positive for premature coronary disease. With CAD  LDL goal is less than 70 . His LDL was 88 on 10/20/13. There is medication compliance with the statin.  Low dose ASA taken Specifically denied are  chest pain, palpitations, dyspnea, or claudication.  Significant abdominal symptoms, memory deficit, or myalgias not present.   His A1c was 6.6% in January of this year. At that time his triglycerides were 50. His wife states he was eating a increased amount of candy       Objective:   Physical Exam Gen.:  well-nourished in appearance. Alert, appropriate and cooperative throughout exam. Appears younger than stated age  Head: Normocephalic without obvious abnormalities;  pattern alopecia   Eyes: No corneal or conjunctival inflammation noted. Pupils equal round reactive to light and accommodation. Extraocular motion intact.  Ears: External  ear exam reveals no significant lesions or deformities. Canals clear .TMs normal. Hearing is grossly decreased slighly on R. Nose: External nasal exam reveals no deformity or inflammation. Nasal mucosa are pink and moist. No lesions or exudates noted.   Mouth: Oral mucosa and oropharynx reveal no lesions or exudates. Teeth in good repair. Neck: No deformities, masses, or tenderness noted. Range of motion & Thyroid normal. Lungs: Normal respiratory effort; chest expands symmetrically. Lungs are clear to auscultation without rales, wheezes, or increased work of breathing.BS decreased Heart: Normal rate and rhythm. Normal S1 and S2. No gallop, click, or rub. No murmur. Abdomen: Bowel sounds normal; abdomen soft and nontender. No masses, organomegaly or hernias noted. Genitalia: as per Urology                                  Musculoskeletal/extremities: No deformity or scoliosis noted of  the thoracic or lumbar spine.    No clubbing, cyanosis, edema, or significant extremity  deformity noted. Range of motion normal .Tone & strength normal. 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  Fingernail  health good. Able to lie down & sit up w/o help. Negative SLR bilaterally Vascular: Carotid, radial artery, dorsalis pedis and  posterior tibial pulses are full and equal. No bruits present. Neurologic: Alert and oriented x3. Deep tendon reflexes symmetrical but 0-1/2+ @ knees Gait normal  including heel & toe walking . Rhomberg & finger to nose       Skin: Intact without suspicious lesions or rashes. Lymph: No cervical, axillary lymphadenopathy  present. Psych: Mood and affect are normal. Normally interactive                                                                                        Assessment & Plan:  #1 Medicare Wellness Exam;  criteria met ; data entered #2 Problem List/Diagnoses reviewed & updated Plan:  Assessments made/ Orders entered

## 2013-12-13 ENCOUNTER — Telehealth: Payer: Self-pay | Admitting: Cardiovascular Disease

## 2013-12-13 NOTE — Telephone Encounter (Signed)
LAB  WAS DONE  AT  [facility]  NOT AT  [facility]    AWAITING   RESULTS  TO BE  FAXED .Adonis Housekeeper

## 2013-12-13 NOTE — Telephone Encounter (Signed)
New message         Pt would like to know results from lab test done at the hospital on 3/6

## 2013-12-14 ENCOUNTER — Telehealth: Payer: Self-pay | Admitting: Internal Medicine

## 2013-12-14 NOTE — Telephone Encounter (Signed)
Chart updated.//AB/CMA

## 2013-12-14 NOTE — Telephone Encounter (Signed)
Somewhat resistant to plavix would do better with brillinta or effient He can see which costs less This has been the issue in past

## 2013-12-14 NOTE — Telephone Encounter (Signed)
Patient's wife is calling to let us know that the patient had a TDap injection yesterday, 12/13/2013 at [facility]. Asked patient to have them fax vaccination record as well.

## 2013-12-14 NOTE — Telephone Encounter (Signed)
RECEIVED HARD COPY  OF  P2Y  VALUE IS  200  WILL  FORWARD DR Johnsie Cancel  FOR  REVIEW./CY

## 2013-12-15 MED ORDER — PRASUGREL HCL 10 MG PO TABS: 10.0000 mg | ORAL_TABLET | Freq: Every day | ORAL | Status: DC

## 2013-12-15 NOTE — Telephone Encounter (Signed)
PT'S WIFE  NOTIFIED    SCRIPT  SENT  IN  FOR EFFIENT  AS   DOSAGE  IS  DAILY./CY

## 2013-12-20 ENCOUNTER — Telehealth: Payer: Self-pay | Admitting: *Deleted

## 2013-12-20 NOTE — Telephone Encounter (Signed)
PA to [facility] for effient

## 2013-12-28 NOTE — Telephone Encounter (Signed)
[employer] denied effient stating patient on plavix and effient, an appeal sent in stating plavix discontinued and effient started 12/15/2013.

## 2014-01-04 ENCOUNTER — Other Ambulatory Visit: Payer: Self-pay | Admitting: *Deleted

## 2014-01-04 ENCOUNTER — Telehealth: Payer: Self-pay | Admitting: *Deleted

## 2014-01-04 MED ORDER — PRASUGREL HCL 10 MG PO TABS: 10.0000 mg | ORAL_TABLET | Freq: Every day | ORAL | Status: DC

## 2014-01-04 NOTE — Telephone Encounter (Signed)
[employer] approved effient PA # [id number], dates 01/03/2014-09/29/2014, called and informed wife, resent script to [facility], 90 for 90 days

## 2014-01-10 ENCOUNTER — Encounter: Payer: Self-pay | Admitting: Internal Medicine

## 2014-03-07 ENCOUNTER — Encounter: Payer: Self-pay | Admitting: Cardiovascular Disease

## 2014-03-07 ENCOUNTER — Ambulatory Visit (INDEPENDENT_AMBULATORY_CARE_PROVIDER_SITE_OTHER): Payer: Medicare HMO | Admitting: Cardiovascular Disease

## 2014-03-07 VITALS — BP 162/82 | HR 80 | Ht 69.0 in | Wt 166.4 lb

## 2014-03-07 DIAGNOSIS — E1159 Type 2 diabetes mellitus with other circulatory complications: Secondary | ICD-10-CM

## 2014-03-07 DIAGNOSIS — C649 Malignant neoplasm of unspecified kidney, except renal pelvis: Secondary | ICD-10-CM

## 2014-03-07 DIAGNOSIS — I2510 Atherosclerotic heart disease of native coronary artery without angina pectoris: Secondary | ICD-10-CM

## 2014-03-07 DIAGNOSIS — I10 Essential (primary) hypertension: Secondary | ICD-10-CM

## 2014-03-07 NOTE — Assessment & Plan Note (Signed)
Some white coat syndrome runs ok at home will monitor at fire station continue beta blocker

## 2014-03-07 NOTE — Progress Notes (Signed)
Patient ID: Curtis Galloway, male   DOB: 18-May-1941, 73 y.o.   MRN: [id number] 73 yo recently hospitalized for SSCP. Cath showed occluded grafts but native LAD angioplasty . On prilosec for reflux. Needs P2Y test. No angina. Wallks daily. Intolerant to nitrates with headache   Compliant with meds  Pt underwent lexiscan myoview which revealed intermediate risk with medium size severe ischemia in the RCA/PDA territory. EF 54%. Plans were made for elective cath and PCI.   Cath revealed:  1. 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  2. Unstable angina secondary to severe hazy stenosis in proximal LAD  3. Successful PTCA/DES x 1 proximal LAD  4. Preserved LV systolic function with akinesis of the inferior wall at the base   Grafts:  SVG: to PDA occluded New since 2003  SVG: Diagonal occluded New since 2003  Free Rima: Came off diagonal graft atretic in 2003  [place]: Known to be occluded cath 2003  Doing well since d/c Needs stomach meds not taking prilosec since d/c but antacids not enough for his reflux      ROS: Denies fever, malais, weight loss, blurry vision, decreased visual acuity, cough, sputum, SOB, hemoptysis, pleuritic pain, palpitaitons, heartburn, abdominal pain, melena, lower extremity edema, claudication, or rash.  All other systems reviewed and negative  General: Affect appropriate Healthy:  appears stated age 61: normal Neck supple with no adenopathy JVP normal no bruits no thyromegaly Lungs clear with no wheezing and good diaphragmatic motion Heart:  S1/S2 no murmur, no rub, gallop or click PMI normal Abdomen: benighn, BS positve, no tenderness, no AAA no bruit.  No HSM or HJR Distal pulses intact with no bruits No edema Neuro non-focal Skin warm and dry No muscular weakness   Current Outpatient Prescriptions  Medication Sig  Dispense Refill  . aspirin EC 81 MG tablet Take 81 mg by mouth daily.      Marland Kitchen atorvastatin (LIPITOR) 80 MG tablet Take 80 mg by mouth daily.      . fish oil-omega-3 fatty acids 1000 MG capsule Take 2 g by mouth daily.       . metoprolol succinate (TOPROL-XL) 50 MG 24 hr tablet Take 50 mg by mouth daily. Take with or immediately following a meal.      . Multiple Vitamin (MULTIVITAMIN) tablet Take 1 tablet by mouth daily.        . nitroGLYCERIN (NITROSTAT) 0.4 MG SL tablet Place 0.4 mg under the tongue every 5 (five) minutes as needed for chest pain.      . prasugrel (EFFIENT) 10 MG TABS tablet Take 1 tablet (10 mg total) by mouth daily.  90 tablet  3   No current facility-administered medications for this visit.    Allergies  Codeine and Tramadol  Electrocardiogram:  Assessment and Plan

## 2014-03-07 NOTE — Assessment & Plan Note (Signed)
Discussed low carb diet.  Target hemoglobin A1c is 6.5 or less.  Continue current medications.  

## 2014-03-07 NOTE — Assessment & Plan Note (Signed)
Stable with no angina and good activity level.  Continue medical Rx On effient as he requires reflux medicine and P2Y high on plavix

## 2014-03-07 NOTE — Assessment & Plan Note (Signed)
Cholesterol is at goal.  Continue current dose of statin and diet Rx.  No myalgias or side effects.  F/U  LFT's in 6 months. Lab Results  Component Value Date   LDLCALC 88 10/20/2013

## 2014-03-07 NOTE — Patient Instructions (Signed)
Your physician wants you to follow-up in:  6 MONTHS WITH DR NISHAN  You will receive a reminder letter in the mail two months in advance. If you don't receive a letter, please call our office to schedule the follow-up appointment. Your physician recommends that you continue on your current medications as directed. Please refer to the Current Medication list given to you today. 

## 2014-03-17 ENCOUNTER — Other Ambulatory Visit: Payer: Self-pay

## 2014-03-17 MED ORDER — METOPROLOL SUCCINATE ER 50 MG PO TB24: 50.0000 mg | ORAL_TABLET | Freq: Every day | ORAL | Status: DC

## 2014-06-11 ENCOUNTER — Telehealth: Payer: Self-pay

## 2014-06-11 NOTE — Telephone Encounter (Signed)
LVM for pt to call back.   RE: BP recheck via 5 min. nurse visit

## 2014-07-05 ENCOUNTER — Telehealth: Payer: Self-pay | Admitting: Internal Medicine

## 2014-07-05 NOTE — Telephone Encounter (Signed)
Patient needs [employer] referral to Dr.Ernesto Botero at [facility].  Referral can be faxed to [phone] Attn:  Becky/Chris

## 2014-07-05 NOTE — Telephone Encounter (Signed)
Patient is getting notes sent over and has an appt scheduled for this Thursday.

## 2014-07-05 NOTE — Telephone Encounter (Signed)
Specialists & insurance organizations  require an updated, current  assessment and written note from the Primary Care physician  to review before they  schedule an appointment to assess symptoms or problems. I reviewed your chart & we  do not have such  a current  assessment of your health issue or complaint in the chart (electronic medical record). Unless they will see w/o referral ;you will need to  make an appointment to create this document THEY REQUIRE. It will be necessary to know prior evaluations and treatments of this symptom and response to these interventions. Please bring that medical history & all medications & supplements to that appointment so I can complete the required document.

## 2014-07-06 ENCOUNTER — Telehealth: Payer: Self-pay | Admitting: *Deleted

## 2014-07-06 NOTE — Telephone Encounter (Signed)
Pt is schedule to see md tomorrow 07/07/14. Called pt no answer LMOM with md response below...Curtis Galloway

## 2014-07-06 NOTE — Telephone Encounter (Signed)
Message copied by Earnstine Regal on Wed Jul 06, 2014  8:44 AM ------      Message from: Hendricks Limes      Created: Wed Jul 06, 2014  8:35 AM       I received  & reviewed copy of 07/04/14 MRI; but I need Dr Laurena Bering office notes.He can sign release of records for those. No information found in chart . He should make an appointment after we have those. I'll summarize his history & her evaluation to document need for the referral as required by [facility]. ------

## 2014-07-07 ENCOUNTER — Ambulatory Visit (INDEPENDENT_AMBULATORY_CARE_PROVIDER_SITE_OTHER): Payer: Commercial Managed Care - HMO | Admitting: Internal Medicine

## 2014-07-07 ENCOUNTER — Encounter: Payer: Self-pay | Admitting: Internal Medicine

## 2014-07-07 VITALS — BP 166/76 | HR 79 | Temp 97.9°F | Wt 169.5 lb

## 2014-07-07 DIAGNOSIS — G039 Meningitis, unspecified: Secondary | ICD-10-CM

## 2014-07-07 DIAGNOSIS — M5416 Radiculopathy, lumbar region: Secondary | ICD-10-CM

## 2014-07-07 DIAGNOSIS — M5136 Other intervertebral disc degeneration, lumbar region: Secondary | ICD-10-CM

## 2014-07-07 MED ORDER — AMITRIPTYLINE HCL 25 MG PO TABS: 25.0000 mg | ORAL_TABLET | Freq: Every day | ORAL | Status: DC

## 2014-07-07 MED ORDER — GABAPENTIN 100 MG PO CAPS: ORAL_CAPSULE | ORAL | Status: DC

## 2014-07-07 NOTE — Progress Notes (Signed)
   Subjective:    Patient ID: Curtis Galloway, male    DOB: 06-04-1941, 73 y.o.   MRN: [id number]  HPI   His symptoms began a month ago without any specific injury or trigger.  He describes it as constant, sharp pain up to a level X from his lumbosacral area & hips down to the knees.  He saw Dr. Lynann Bologna who apparently gave him a steroid injection  In the L. hip without benefit  He also took prednisone for 7 days without benefit.  He continues to have constant pain as well as weakness in the legs  MRI was performed 07/04/14. 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 There is concern for arachnoiditis anteriorly at the L3-4 levels along the left posteriolateral margin of the L4-L5 intervertebral level  Degenerative disc changes with mild to moderate foraminal impingement  at L5-S1 and mild impingement of L2-3, L3-4, and L4-5.  He had fusion at L3-4-5 on 06/08/14 by Dr. Joya Salm. He tried oxycodone  & diazepam prescribed in 9/14  for his present pain with minimal benefit  He has occasional numbness of the left lateral knee area.      Review of Systems   He denies any incontinence of urine or stool.  No fever, chills , sweats or weight loss.       Objective:   Physical Exam Positive or  pertinent findings include: As he stands from the chair he is very unstable and must wait a few seconds to catch his balance He walks with a broad gait with toes pointed outward. He lies flat and sits up slowly in a  "low back crawl" fashion. Straight leg raising is negative bilaterally. The reflexes are 0-1/2+ in the right knee and 0+ on left. Strength and tone are normal in lower  extremities Breath sounds are decreased without increased work of breathing   General appearance :adequately nourished; in no distress. Eyes: No conjunctival inflammation or scleral icterus is present. Heart:  Normal rate and regular rhythm. S1 and S2 normal without gallop, murmur, click, or  rub. S4  Lungs: no wheezes, rhonchi,rales ,or rubs present. Abdomen: bowel sounds normal, soft and non-tender without masses, organomegaly or hernias noted.  No guarding or rebound. No flank tenderness to percussion. Vascular : all pulses equal ; no bruits present. Skin:Warm & dry.  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

## 2014-07-07 NOTE — Patient Instructions (Addendum)
Please sign a release of records to Dr Lynann Bologna for  Office records related to back pain.  Assess response to the gabapentin one every 8 hours as needed. If it is partially beneficial, it can be increased up to a total of 3 pills every 8 hours as needed. This increase of 1 pill each dose  should take place over 72 hours at least.

## 2014-07-07 NOTE — Progress Notes (Signed)
Pre visit review using our clinic review tool, if applicable. No additional management support is needed unless otherwise documented below in the visit note. 

## 2014-07-08 DIAGNOSIS — M5416 Radiculopathy, lumbar region: Secondary | ICD-10-CM | POA: Insufficient documentation

## 2014-07-13 ENCOUNTER — Telehealth: Payer: Self-pay | Admitting: Internal Medicine

## 2014-07-13 NOTE — Telephone Encounter (Signed)
[facility] forward 4 pages to Dr.Hopper

## 2014-07-18 ENCOUNTER — Telehealth: Payer: Self-pay | Admitting: Internal Medicine

## 2014-07-18 ENCOUNTER — Other Ambulatory Visit: Payer: Self-pay | Admitting: Internal Medicine

## 2014-07-18 DIAGNOSIS — M5416 Radiculopathy, lumbar region: Secondary | ICD-10-CM

## 2014-07-18 NOTE — Telephone Encounter (Signed)
Wife called and pt needs a referral Dr Joya Salm [facility] Neuro/Spine) medication has not helped.

## 2014-07-18 NOTE — Telephone Encounter (Signed)
MD is out of office today. Will hold until he return tomorrow...Johny Chess

## 2014-08-01 ENCOUNTER — Other Ambulatory Visit: Payer: Self-pay | Admitting: Cardiovascular Disease

## 2014-08-08 ENCOUNTER — Other Ambulatory Visit: Payer: Self-pay | Admitting: Neurosurgery

## 2014-08-08 DIAGNOSIS — M5416 Radiculopathy, lumbar region: Secondary | ICD-10-CM

## 2014-08-15 ENCOUNTER — Other Ambulatory Visit: Payer: Commercial Managed Care - HMO

## 2014-08-15 ENCOUNTER — Inpatient Hospital Stay: Admission: RE | Admit: 2014-08-15 | Payer: Commercial Managed Care - HMO | Source: Ambulatory Visit

## 2014-09-04 NOTE — Progress Notes (Signed)
Patient ID: Curtis Galloway, male   DOB: Feb 11, 1941, 73 y.o.   MRN: [id number] 73 yo f/u CAD . Cath  2013 showed occluded grafts and had stent to LAD .No angina. Wallks daily. Intolerant to nitrates with headache   Compliant with meds   Cath revealed:  1. 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  2. Unstable angina secondary to severe hazy stenosis in proximal LAD  3. Successful PTCA/DES x 1 proximal LAD  4. Preserved LV systolic function with akinesis of the inferior wall at the base   Grafts:  SVG: to PDA occluded New since 2003  SVG: Diagonal occluded New since 2003  Free Rima: Came off diagonal graft atretic in 2003  [place]: Known to be occluded cath 2003   On Effient until 2/16  Needs pain management for back and mylogram when off Effient  Needs blood work today       ROS: Denies fever, malais, weight loss, blurry vision, decreased visual acuity, cough, sputum, SOB, hemoptysis, pleuritic pain, palpitaitons, heartburn, abdominal pain, melena, lower extremity edema, claudication, or rash.  All other systems reviewed and negative  General: Affect appropriate Healthy:  appears stated age 89: normal Neck supple with no adenopathy JVP normal no bruits no thyromegaly Lungs clear with no wheezing and good diaphragmatic motion Heart:  S1/S2 no murmur, no rub, gallop or click PMI normal Abdomen: benighn, BS positve, no tenderness, no AAA no bruit.  No HSM or HJR Distal pulses intact with no bruits No edema Neuro non-focal Skin warm and dry No muscular weakness   Current Outpatient Prescriptions  Medication Sig Dispense Refill  . aspirin EC 81 MG tablet Take 81 mg by mouth daily.    Marland Kitchen atorvastatin (LIPITOR) 80 MG tablet TAKE 1 TABLET AT BEDTIME 90 tablet 1  . fish oil-omega-3 fatty acids 1000 MG capsule Take 2 g by mouth  daily.     . metoprolol succinate (TOPROL-XL) 50 MG 24 hr tablet Take 1 tablet (50 mg total) by mouth daily. Take with or immediately following a meal. 30 tablet 6  . Multiple Vitamin (MULTIVITAMIN) tablet Take 1 tablet by mouth daily.      . nitroGLYCERIN (NITROSTAT) 0.4 MG SL tablet Place 0.4 mg under the tongue every 5 (five) minutes as needed for chest pain.    . prasugrel (EFFIENT) 10 MG TABS tablet Take 1 tablet (10 mg total) by mouth daily. 90 tablet 3   No current facility-administered medications for this visit.    Allergies  Codeine and Tramadol  Electrocardiogram:  2/15  SR rate 94 nonspecific ST/T wave changes   Assessment and Plan

## 2014-09-05 ENCOUNTER — Ambulatory Visit (INDEPENDENT_AMBULATORY_CARE_PROVIDER_SITE_OTHER): Payer: Commercial Managed Care - HMO | Admitting: Cardiovascular Disease

## 2014-09-05 ENCOUNTER — Telehealth: Payer: Self-pay | Admitting: *Deleted

## 2014-09-05 ENCOUNTER — Telehealth: Payer: Self-pay | Admitting: Internal Medicine

## 2014-09-05 ENCOUNTER — Encounter: Payer: Self-pay | Admitting: Cardiovascular Disease

## 2014-09-05 VITALS — BP 142/80 | HR 94 | Ht 69.0 in | Wt 168.8 lb

## 2014-09-05 DIAGNOSIS — Z79899 Other long term (current) drug therapy: Secondary | ICD-10-CM

## 2014-09-05 DIAGNOSIS — I2510 Atherosclerotic heart disease of native coronary artery without angina pectoris: Secondary | ICD-10-CM

## 2014-09-05 DIAGNOSIS — M5416 Radiculopathy, lumbar region: Secondary | ICD-10-CM

## 2014-09-05 DIAGNOSIS — Z0000 Encounter for general adult medical examination without abnormal findings: Secondary | ICD-10-CM

## 2014-09-05 DIAGNOSIS — E785 Hyperlipidemia, unspecified: Secondary | ICD-10-CM

## 2014-09-05 LAB — HEPATIC FUNCTION PANEL
ALT: 17 U/L (ref 0–53)
AST: 18 U/L (ref 0–37)
Albumin: 3.5 g/dL (ref 3.5–5.2)
Alkaline Phosphatase: 95 U/L (ref 39–117)
Bilirubin, Direct: 0.1 mg/dL (ref 0.0–0.3)
Total Bilirubin: 0.7 mg/dL (ref 0.2–1.2)
Total Protein: 6.9 g/dL (ref 6.0–8.3)

## 2014-09-05 LAB — CBC WITH DIFFERENTIAL/PLATELET
BASOS ABS: 0 10*3/uL (ref 0.0–0.1)
Basophils Relative: 0 % (ref 0.0–3.0)
EOS PCT: 0.4 % (ref 0.0–5.0)
Eosinophils Absolute: 0.1 10*3/uL (ref 0.0–0.7)
HCT: 40.1 % (ref 39.0–52.0)
Hemoglobin: 13.1 g/dL (ref 13.0–17.0)
LYMPHS ABS: 1.6 10*3/uL (ref 0.7–4.0)
Lymphocytes Relative: 8.6 % — ABNORMAL LOW (ref 12.0–46.0)
MCHC: 32.6 g/dL (ref 30.0–36.0)
MCV: 91.7 fl (ref 78.0–100.0)
MONO ABS: 1.1 10*3/uL — AB (ref 0.1–1.0)
Monocytes Relative: 5.8 % (ref 3.0–12.0)
Neutro Abs: 15.7 10*3/uL — ABNORMAL HIGH (ref 1.4–7.7)
Neutrophils Relative %: 85.2 % — ABNORMAL HIGH (ref 43.0–77.0)
PLATELETS: 324 10*3/uL (ref 150.0–400.0)
RBC: 4.38 Mil/uL (ref 4.22–5.81)
RDW: 13.5 % (ref 11.5–15.5)
WBC: 18.5 10*3/uL (ref 4.0–10.5)

## 2014-09-05 LAB — LIPID PANEL
Cholesterol: 142 mg/dL (ref 0–200)
HDL: 37.9 mg/dL — ABNORMAL LOW
LDL Cholesterol: 85 mg/dL (ref 0–99)
NonHDL: 104.1
Total CHOL/HDL Ratio: 4
Triglycerides: 96 mg/dL (ref 0.0–149.0)
VLDL: 19.2 mg/dL (ref 0.0–40.0)

## 2014-09-05 LAB — BASIC METABOLIC PANEL WITH GFR
BUN: 11 mg/dL (ref 6–23)
CO2: 26 meq/L (ref 19–32)
Calcium: 9 mg/dL (ref 8.4–10.5)
Chloride: 104 meq/L (ref 96–112)
Creatinine, Ser: 1 mg/dL (ref 0.4–1.5)
GFR: 76.34 mL/min
Glucose, Bld: 120 mg/dL — ABNORMAL HIGH (ref 70–99)
Potassium: 4 meq/L (ref 3.5–5.1)
Sodium: 138 meq/L (ref 135–145)

## 2014-09-05 LAB — HEMOGLOBIN A1C: Hgb A1c MFr Bld: 6.6 % — ABNORMAL HIGH (ref 4.6–6.5)

## 2014-09-05 LAB — PSA: PSA: 0.5 ng/mL (ref 0.10–4.00)

## 2014-09-05 NOTE — Telephone Encounter (Signed)
CALLED PT  AS WELL AS   LEFT MESSAGE AT DR HOPPER'S OFFICE  RE  CRITICAL LAB  WBC 18.5    DISCUSSED  ELEVATED  WBC  WITH DR  Johnsie Cancel  AND  PT NEEDS  TO FOLLOW UP  WITH DR  HOPPER PER DR  NISHAN./CY

## 2014-09-05 NOTE — Assessment & Plan Note (Signed)
Stable with no angina and good activity level.  Continue medical Rx Stop Effient in February samples given

## 2014-09-05 NOTE — Assessment & Plan Note (Addendum)
Cholesterol is at goal.  Continue current dose of statin and diet Rx.  No myalgias or side effects.  F/U  LFT's in 6 months. Lab Results  Component Value Date   LDLCALC 88 10/20/2013   Repeat labs today

## 2014-09-05 NOTE — Assessment & Plan Note (Signed)
Well controlled.  Continue current medications and low sodium Dash type diet.    

## 2014-09-05 NOTE — Telephone Encounter (Signed)
Left message on pt's VM to call back for appt 12/8 at 4:45 pm

## 2014-09-05 NOTE — Patient Instructions (Addendum)
Your physician wants you to follow-up in: [place] will receive a reminder letter in the mail two months in advance. If you don't receive a letter, please call our office to schedule the follow-up appointment.  Your physician recommends that you continue on your current medications as directed. Please refer to the Current Medication list given to you today. Your physician recommends that you return for lab work in:  TODAY   CBC  BMET   LIPID  LIVER  A1C  AND PSA

## 2014-09-05 NOTE — Assessment & Plan Note (Signed)
Ok to stop Effient in February for further invasive w/u seems stable at this point

## 2014-09-05 NOTE — Telephone Encounter (Signed)
White blood count elevated @ Cardiology appointment; please make appt for 4:45 pm 12/8

## 2014-09-05 NOTE — Telephone Encounter (Signed)
CBC, critical white blood count 18.5 per Christine at Dr Kyla Balzarine called, Pls call pt.

## 2014-09-06 ENCOUNTER — Ambulatory Visit: Payer: Commercial Managed Care - HMO | Admitting: Internal Medicine

## 2014-09-07 ENCOUNTER — Encounter: Payer: Self-pay | Admitting: Internal Medicine

## 2014-09-07 ENCOUNTER — Ambulatory Visit (INDEPENDENT_AMBULATORY_CARE_PROVIDER_SITE_OTHER): Payer: Commercial Managed Care - HMO | Admitting: Internal Medicine

## 2014-09-07 VITALS — BP 128/80 | HR 85 | Temp 97.6°F | Wt 169.1 lb

## 2014-09-07 DIAGNOSIS — J209 Acute bronchitis, unspecified: Secondary | ICD-10-CM

## 2014-09-07 DIAGNOSIS — J0110 Acute frontal sinusitis, unspecified: Secondary | ICD-10-CM

## 2014-09-07 DIAGNOSIS — E1151 Type 2 diabetes mellitus with diabetic peripheral angiopathy without gangrene: Secondary | ICD-10-CM

## 2014-09-07 DIAGNOSIS — D72829 Elevated white blood cell count, unspecified: Secondary | ICD-10-CM

## 2014-09-07 MED ORDER — BENZONATATE 200 MG PO CAPS: 200.0000 mg | ORAL_CAPSULE | Freq: Three times a day (TID) | ORAL | Status: DC | PRN

## 2014-09-07 MED ORDER — DOXYCYCLINE HYCLATE 100 MG PO TABS: 100.0000 mg | ORAL_TABLET | Freq: Two times a day (BID) | ORAL | Status: DC

## 2014-09-07 NOTE — Progress Notes (Signed)
   Subjective:    Patient ID: Curtis Galloway, male    DOB: 1940-12-10, 73 y.o.   MRN: [id number]  HPI   His cardiologist performed an extensive lab profile. Abnormalities included a white count of 18,500 with a left shift.  He also had fasting glucose of 120; his A1c was 6.6%.  His mother was a diabetic  In the last 2-3 weeks he's had some upper respiratory tract symptoms with frontal headache and the production of clear nasal discharge as well as clear sputum.  He is a remote smoker.  He treated his present symptoms with Tylenol and Coricidin HBP with partial response.  His wife has had some similar symptoms.    Review of Systems Facial pain , nasal purulence, sore throat , otic pain or otic discharge denied. No fever , chills or sweats.  No significant extrinsic symptoms present.  He has no associated shortness breath or wheezing.  He denies polyuria, polydipsia, polyphagia.     Objective:   Physical Exam    Positive or pertinent findings include: He has complete dentures Breath sounds are decreased throughout the thorax. He has minor rales at the bases without increased work of breathing The thumbnails are dramatically deformed from previous trauma    General appearance:good health ;well nourished; no acute distress .  No  lymphadenopathy about the head, neck, or axilla noted.  Eyes: No conjunctival inflammation or lid edema is present. There is no scleral icterus. Ears:  External ear exam shows no significant lesions or deformities.  Otoscopic examination reveals clear canals, tympanic membranes are intact bilaterally without bulging, retraction, inflammation or discharge. Nose:  External nasal examination shows no deformity or inflammation. Nasal mucosa are pink and moist without lesions or exudates. No septal dislocation or deviation.No obstruction to airflow.  Oral exam: lips and gums are healthy appearing.There is no oropharyngeal erythema or exudate noted.  Neck:   No deformities, thyromegaly, masses, or tenderness noted.   Supple with full range of motion without pain.  Heart:  Normal rate and regular rhythm. S1 and S2 normal without gallop, murmur, click, rub or other extra sounds.  Extremities:  No cyanosis, edema, or clubbing  noted Skin: Warm & dry w/o jaundice or tenting.        Assessment & Plan:  #1 rhinosinusitis  #2 bronchitis #3 leukocytosis #4 DM  Plan: Nasal hygiene interventions discussed. See prescription medications

## 2014-09-07 NOTE — Progress Notes (Signed)
Pre visit review using our clinic review tool, if applicable. No additional management support is needed unless otherwise documented below in the visit note. 

## 2014-09-07 NOTE — Patient Instructions (Addendum)
Plain Mucinex (NOT D) for thick secretions ;force NON dairy fluids .   Nasal cleansing in the shower as discussed with lather of mild shampoo.After 10 seconds wash off lather while  exhaling through nostrils. Make sure that all residual soap is removed to prevent irritation.  Flonase OR Nasacort AQ 1 spray in each nostril twice a day as needed. Use the "crossover" technique into opposite nostril spraying toward opposite ear @ 45 degree angle, not straight up into nostril.  Use a Neti pot daily only  as needed for significant sinus congestion; going from open side to congested side . Plain Allegra (NOT D )  160 daily , Loratidine 10 mg , OR Zyrtec 10 mg @ bedtime  as needed for itchy eyes & sneezing.   The following nutritional changes may help prevent Diabetes progression & complications.  White carbohydrates (potatoes, rice, bread, and pasta) cause a high spike of the sugar level which stays elevated for a significant period of time (called sugar"load").  For example a  baked potato has a cup of sugar and a  french fry  2 teaspoons of sugar.  More complex carbs such as yams, wild  rice, whole grained bread &  wheat pasta have been much lower spike and persistent load of sugar than the white carbs. The pancreas excretes excess insulin in response to the high spike & load of sugar . Over time the pancreas can actually run out of insulin necessitating insulin shots. A1c assesses average 24 hour  glucose over prior 6-12 weeks.  No Diabetes risk if < 6.1%  "Pre Diabetes" :6.2-6.4 % Good diabetic control: 6.5-7 % Fair diabetic control: 7-8 % Poor diabetic control: greater than 8 % ( except with additional factors such as  advanced age; significant coronary or neurologic disease,etc).  An  A1c of 7 % or less  is the safest goal for you.Most importantly, it is critical to prevent hypoglycemia.  Recheck A1c in 4 months.

## 2014-09-08 ENCOUNTER — Encounter (HOSPITAL_COMMUNITY): Payer: Self-pay | Admitting: Cardiovascular Disease

## 2014-09-21 ENCOUNTER — Other Ambulatory Visit (INDEPENDENT_AMBULATORY_CARE_PROVIDER_SITE_OTHER): Payer: Commercial Managed Care - HMO

## 2014-09-21 DIAGNOSIS — E1151 Type 2 diabetes mellitus with diabetic peripheral angiopathy without gangrene: Secondary | ICD-10-CM

## 2014-09-21 DIAGNOSIS — J0110 Acute frontal sinusitis, unspecified: Secondary | ICD-10-CM

## 2014-09-21 DIAGNOSIS — D72829 Elevated white blood cell count, unspecified: Secondary | ICD-10-CM

## 2014-09-21 LAB — CBC WITH DIFFERENTIAL/PLATELET
BASOS PCT: 0.7 % (ref 0.0–3.0)
Basophils Absolute: 0.1 10*3/uL (ref 0.0–0.1)
EOS ABS: 0.2 10*3/uL (ref 0.0–0.7)
Eosinophils Relative: 2 % (ref 0.0–5.0)
HCT: 40.4 % (ref 39.0–52.0)
HEMOGLOBIN: 13.2 g/dL (ref 13.0–17.0)
LYMPHS ABS: 2.2 10*3/uL (ref 0.7–4.0)
Lymphocytes Relative: 23.3 % (ref 12.0–46.0)
MCHC: 32.6 g/dL (ref 30.0–36.0)
MCV: 92.2 fl (ref 78.0–100.0)
Monocytes Absolute: 0.8 10*3/uL (ref 0.1–1.0)
Monocytes Relative: 8.9 % (ref 3.0–12.0)
NEUTROS ABS: 6 10*3/uL (ref 1.4–7.7)
NEUTROS PCT: 65.1 % (ref 43.0–77.0)
Platelets: 273 10*3/uL (ref 150.0–400.0)
RBC: 4.39 Mil/uL (ref 4.22–5.81)
RDW: 14.1 % (ref 11.5–15.5)
WBC: 9.3 10*3/uL (ref 4.0–10.5)

## 2014-09-21 LAB — HEMOGLOBIN A1C: Hgb A1c MFr Bld: 6.5 % (ref 4.6–6.5)

## 2014-09-21 LAB — MICROALBUMIN / CREATININE URINE RATIO
Creatinine,U: 137.5 mg/dL
Microalb Creat Ratio: 0.9 mg/g (ref 0.0–30.0)
Microalb, Ur: 1.2 mg/dL (ref 0.0–1.9)

## 2014-10-01 ENCOUNTER — Emergency Department (HOSPITAL_COMMUNITY)
Admission: EM | Admit: 2014-10-01 | Discharge: 2014-10-01 | Disposition: A | Discharge status: Nursing Home (Includes ICF) | Payer: Medicare HMO | Source: Home / Self Care | Attending: Family Medicine | Admitting: Family Medicine

## 2014-10-01 ENCOUNTER — Emergency Department (HOSPITAL_COMMUNITY)
Admission: EM | Admit: 2014-10-01 | Discharge: 2014-10-01 | Disposition: A | Discharge status: Home / Self Care | Payer: Commercial Managed Care - HMO | Attending: Emergency Medicine | Admitting: Emergency Medicine

## 2014-10-01 ENCOUNTER — Encounter (HOSPITAL_COMMUNITY): Payer: Self-pay | Admitting: Emergency Medicine

## 2014-10-01 ENCOUNTER — Emergency Department (HOSPITAL_COMMUNITY): Payer: Commercial Managed Care - HMO

## 2014-10-01 ENCOUNTER — Encounter (HOSPITAL_COMMUNITY): Payer: Self-pay | Admitting: *Deleted

## 2014-10-01 DIAGNOSIS — K529 Noninfective gastroenteritis and colitis, unspecified: Secondary | ICD-10-CM | POA: Diagnosis not present

## 2014-10-01 DIAGNOSIS — Z9889 Other specified postprocedural states: Secondary | ICD-10-CM | POA: Insufficient documentation

## 2014-10-01 DIAGNOSIS — R630 Anorexia: Secondary | ICD-10-CM | POA: Diagnosis not present

## 2014-10-01 DIAGNOSIS — R109 Unspecified abdominal pain: Secondary | ICD-10-CM | POA: Diagnosis present

## 2014-10-01 DIAGNOSIS — I2510 Atherosclerotic heart disease of native coronary artery without angina pectoris: Secondary | ICD-10-CM | POA: Insufficient documentation

## 2014-10-01 DIAGNOSIS — Z7982 Long term (current) use of aspirin: Secondary | ICD-10-CM | POA: Insufficient documentation

## 2014-10-01 DIAGNOSIS — Z9049 Acquired absence of other specified parts of digestive tract: Secondary | ICD-10-CM | POA: Diagnosis not present

## 2014-10-01 DIAGNOSIS — R112 Nausea with vomiting, unspecified: Secondary | ICD-10-CM | POA: Insufficient documentation

## 2014-10-01 DIAGNOSIS — Z79899 Other long term (current) drug therapy: Secondary | ICD-10-CM | POA: Insufficient documentation

## 2014-10-01 DIAGNOSIS — Z87891 Personal history of nicotine dependence: Secondary | ICD-10-CM | POA: Diagnosis not present

## 2014-10-01 DIAGNOSIS — E785 Hyperlipidemia, unspecified: Secondary | ICD-10-CM | POA: Insufficient documentation

## 2014-10-01 DIAGNOSIS — Z79891 Long term (current) use of opiate analgesic: Secondary | ICD-10-CM | POA: Insufficient documentation

## 2014-10-01 DIAGNOSIS — Z8719 Personal history of other diseases of the digestive system: Secondary | ICD-10-CM | POA: Diagnosis not present

## 2014-10-01 DIAGNOSIS — Z85528 Personal history of other malignant neoplasm of kidney: Secondary | ICD-10-CM | POA: Diagnosis not present

## 2014-10-01 DIAGNOSIS — Z951 Presence of aortocoronary bypass graft: Secondary | ICD-10-CM | POA: Insufficient documentation

## 2014-10-01 DIAGNOSIS — Z9861 Coronary angioplasty status: Secondary | ICD-10-CM | POA: Insufficient documentation

## 2014-10-01 DIAGNOSIS — R1032 Left lower quadrant pain: Secondary | ICD-10-CM

## 2014-10-01 DIAGNOSIS — I10 Essential (primary) hypertension: Secondary | ICD-10-CM | POA: Diagnosis not present

## 2014-10-01 LAB — URINALYSIS, ROUTINE W REFLEX MICROSCOPIC
Glucose, UA: NEGATIVE mg/dL
HGB URINE DIPSTICK: NEGATIVE
Ketones, ur: NEGATIVE mg/dL
LEUKOCYTES UA: NEGATIVE
NITRITE: NEGATIVE
Protein, ur: NEGATIVE mg/dL
Specific Gravity, Urine: 1.037 — ABNORMAL HIGH (ref 1.005–1.030)
Urobilinogen, UA: 0.2 mg/dL (ref 0.0–1.0)
pH: 5 (ref 5.0–8.0)

## 2014-10-01 LAB — COMPREHENSIVE METABOLIC PANEL
ALT: 23 U/L (ref 0–53)
AST: 23 U/L (ref 0–37)
Albumin: 3.6 g/dL (ref 3.5–5.2)
Alkaline Phosphatase: 94 U/L (ref 39–117)
Anion gap: 8 (ref 5–15)
BUN: 16 mg/dL (ref 6–23)
CHLORIDE: 103 meq/L (ref 96–112)
CO2: 28 mmol/L (ref 19–32)
Calcium: 9.1 mg/dL (ref 8.4–10.5)
Creatinine, Ser: 1.25 mg/dL (ref 0.50–1.35)
GFR calc Af Amer: 65 mL/min — ABNORMAL LOW (ref >90)
GFR calc non Af Amer: 56 mL/min — ABNORMAL LOW (ref >90)
Glucose, Bld: 145 mg/dL — ABNORMAL HIGH (ref 70–99)
POTASSIUM: 3.9 mmol/L (ref 3.5–5.1)
SODIUM: 139 mmol/L (ref 135–145)
Total Bilirubin: 0.8 mg/dL (ref 0.3–1.2)
Total Protein: 6.8 g/dL (ref 6.0–8.3)

## 2014-10-01 LAB — CBC WITH DIFFERENTIAL/PLATELET
Basophils Absolute: 0 10*3/uL (ref 0.0–0.1)
Basophils Relative: 0 % (ref 0–1)
Eosinophils Absolute: 0 10*3/uL (ref 0.0–0.7)
Eosinophils Relative: 0 % (ref 0–5)
HEMATOCRIT: 40.6 % (ref 39.0–52.0)
Hemoglobin: 13.7 g/dL (ref 13.0–17.0)
LYMPHS ABS: 1.2 10*3/uL (ref 0.7–4.0)
Lymphocytes Relative: 8 % — ABNORMAL LOW (ref 12–46)
MCH: 31.1 pg (ref 26.0–34.0)
MCHC: 33.7 g/dL (ref 30.0–36.0)
MCV: 92.1 fL (ref 78.0–100.0)
Monocytes Absolute: 0.9 10*3/uL (ref 0.1–1.0)
Monocytes Relative: 6 % (ref 3–12)
NEUTROS ABS: 12.1 10*3/uL — AB (ref 1.7–7.7)
NEUTROS PCT: 86 % — AB (ref 43–77)
PLATELETS: 239 10*3/uL (ref 150–400)
RBC: 4.41 MIL/uL (ref 4.22–5.81)
RDW: 13.5 % (ref 11.5–15.5)
WBC: 14.2 10*3/uL — ABNORMAL HIGH (ref 4.0–10.5)

## 2014-10-01 LAB — LIPASE, BLOOD: LIPASE: 22 U/L (ref 11–59)

## 2014-10-01 MED ORDER — MORPHINE SULFATE 4 MG/ML IJ SOLN
4.0000 mg | Freq: Once | INTRAMUSCULAR | Status: AC
  Administered 2014-10-01: 4 mg via INTRAVENOUS
  Filled 2014-10-01: qty 1

## 2014-10-01 MED ORDER — SODIUM CHLORIDE 0.9 % IV BOLUS (SEPSIS)
1000.0000 mL | Freq: Once | INTRAVENOUS | Status: AC
  Administered 2014-10-01: 1000 mL via INTRAVENOUS

## 2014-10-01 MED ORDER — ONDANSETRON HCL 4 MG/2ML IJ SOLN
4.0000 mg | Freq: Once | INTRAMUSCULAR | Status: AC
  Administered 2014-10-01: 4 mg via INTRAVENOUS
  Filled 2014-10-01: qty 2

## 2014-10-01 MED ORDER — ONDANSETRON HCL 4 MG PO TABS: 4.0000 mg | ORAL_TABLET | Freq: Four times a day (QID) | ORAL | Status: DC

## 2014-10-01 MED ORDER — IOHEXOL 300 MG/ML  SOLN: 50.0000 mL | Freq: Once | INTRAMUSCULAR | Status: DC | PRN

## 2014-10-01 NOTE — ED Provider Notes (Signed)
CSN: [id number]     Arrival date & time 10/01/14  1017 History   First MD Initiated Contact with Patient 10/01/14 1024     Chief Complaint  Patient presents with  . Abdominal Pain     (Consider location/radiation/quality/duration/timing/severity/associated sxs/prior Treatment) Patient is a 74 y.o. male presenting with abdominal pain. The history is provided by the patient and the spouse.  Abdominal Pain Pain location:  LLQ Pain quality: sharp and shooting   Pain radiates to:  Does not radiate Pain severity:  Moderate Onset quality:  Gradual Duration:  1 day Timing:  Constant Progression:  Worsening Chronicity:  New Context: not alcohol use, not sick contacts and not suspicious food intake   Relieved by:  Nothing Worsened by:  Movement Ineffective treatments:  None tried Associated symptoms: anorexia, nausea and vomiting   Associated symptoms: no chest pain, no chills, no constipation, no diarrhea, no dysuria and no fever   Risk factors: being elderly   Risk factors: no alcohol abuse, no NSAID use and no recent hospitalization     Past Medical History  Diagnosis Date  . GERD (gastroesophageal reflux disease)   . Hyperlipidemia   . Hypertension   . Coronary artery disease     Dr. Johnsie Cancel,  stent to LAD  . Hypernephroma 2004    Dr Rosana Hoes  . Abnormal nuclear stress test 11/16/2013   Past Surgical History  Procedure Laterality Date  . Appendectomy    . Coronary artery bypass graft  2000    Dr. Roxan Hockey 2001  . Lumbar laminectomy      x 4  . 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 Cervical laminectomy      x 1  . Colonscopy  2006    Dr. Delfin Edis; negative  . Knee surgery  3-4 surgeries    bone moved from hip to under knee cap  . Cardiac catheterization  11/01/2011    Dr Johnsie Cancel  . Coronary angioplasty with stent placement  1990's; 11/15/2013    "today's make a total of 3" (11/15/2013)  . Tonsillectomy    . Lumbar fusion  06/08/2013    [place]  . Coronary  angioplasty with stent placement  11/15/13    Promus to prox. LAD  . Colonoscopy with polypectomy  2014    Dr Paulita Fujita  . Left heart catheterization with coronary/graft angiogram  11/01/2011    Procedure: LEFT HEART CATHETERIZATION WITH Beatrix Fetters;  Surgeon: Josue Hector, MD;  Location: [facility] CATH LAB;  Service: Cardiovascular;;  . Left heart catheterization with coronary/graft angiogram N/A 11/15/2013    Procedure: LEFT HEART CATHETERIZATION WITH Beatrix Fetters;  Surgeon: Burnell Blanks, MD;  Location: [facility] CATH LAB;  Service: Cardiovascular;  Laterality: N/A;   Family History  Problem Relation Age of Onset  . Diabetes Mother   . Coronary artery disease Mother   . Heart attack Father 87  . Heart attack Brother 41  . Heart attack Brother     in his 41s  . Cancer Maternal Grandmother     uterine cancer  . Stroke Neg Hx   . Coronary artery disease Other     M & P aunts/uncles   History  Substance Use Topics  . Smoking status: Former Smoker -- 1.00 packs/day for 38 years    Types: Cigarettes    Quit date: 10/01/1996  . Smokeless tobacco: Never Used     Comment: smoked 1960-1998, up 1 ppd  . Alcohol Use: No  Review of Systems  Constitutional: Negative for fever and chills.  Cardiovascular: Negative for chest pain.  Gastrointestinal: Positive for nausea, vomiting, abdominal pain and anorexia. Negative for diarrhea and constipation.  Genitourinary: Negative for dysuria.  All other systems reviewed and are negative.     Allergies  Codeine and Tramadol  Home Medications   Prior to Admission medications   Medication Sig Start Date End Date Taking? Authorizing Provider  aspirin EC 81 MG tablet Take 81 mg by mouth daily.   Yes Historical Provider, MD  atorvastatin (LIPITOR) 80 MG tablet TAKE 1 TABLET AT BEDTIME 08/03/14  Yes Josue Hector, MD  fish oil-omega-3 fatty acids 1000 MG capsule Take 2 g by mouth daily.    Yes Historical Provider, MD   metoprolol succinate (TOPROL-XL) 50 MG 24 hr tablet Take 1 tablet (50 mg total) by mouth daily. Take with or immediately following a meal. 03/17/14  Yes Josue Hector, MD  Multiple Vitamin (MULTIVITAMIN) tablet Take 1 tablet by mouth daily.     Yes Historical Provider, MD  nitroGLYCERIN (NITROSTAT) 0.4 MG SL tablet Place 0.4 mg under the tongue every 5 (five) minutes as needed for chest pain.   Yes Historical Provider, MD  oxyCODONE (ROXICODONE) 15 MG immediate release tablet Take 15 mg by mouth every 6 (six) hours as needed for pain.  08/17/14  Yes Historical Provider, MD  prasugrel (EFFIENT) 10 MG TABS tablet Take 1 tablet (10 mg total) by mouth daily. 01/04/14  Yes Josue Hector, MD  benzonatate (TESSALON) 200 MG capsule Take 1 capsule (200 mg total) by mouth 3 (three) times daily as needed for cough. Patient not taking: Reported on 10/01/2014 09/07/14   Hendricks Limes, MD  doxycycline (VIBRA-TABS) 100 MG tablet Take 1 tablet (100 mg total) by mouth 2 (two) times daily. Patient not taking: Reported on 10/01/2014 09/07/14   Hendricks Limes, MD   BP 143/68 mmHg  Pulse 75  Temp(Src) 97.7 F (36.5 C) (Oral)  Resp 14  SpO2 96% Physical Exam  Constitutional: He is oriented to person, place, and time. He appears well-developed and well-nourished. No distress.  HENT:  Head: Normocephalic and atraumatic.  Mouth/Throat: Oropharynx is clear and moist.  Eyes: Conjunctivae and EOM are normal. Pupils are equal, round, and reactive to light.  Neck: Normal range of motion. Neck supple.  Cardiovascular: Normal rate, regular rhythm and intact distal pulses.   No murmur heard. Pulmonary/Chest: Effort normal and breath sounds normal. No respiratory distress. He has no wheezes. He has no rales.  Abdominal: Soft. He exhibits no distension. There is tenderness in the left lower quadrant. There is guarding. There is no rebound and no CVA tenderness.  Musculoskeletal: Normal range of motion. He exhibits no edema  or tenderness.  Neurological: He is alert and oriented to person, place, and time.  Skin: Skin is warm and dry. No rash noted. No erythema.  Psychiatric: He has a normal mood and affect. His behavior is normal.  Nursing note and vitals reviewed.   ED Course  Procedures (including critical care time) Labs Review Labs Reviewed  CBC WITH DIFFERENTIAL - Abnormal; Notable for the following:    WBC 14.2 (*)    Neutrophils Relative % 86 (*)    Neutro Abs 12.1 (*)    Lymphocytes Relative 8 (*)    All other components within normal limits  COMPREHENSIVE METABOLIC PANEL - Abnormal; Notable for the following:    Glucose, Bld 145 (*)  GFR calc non Af Amer 56 (*)    GFR calc Af Amer 65 (*)    All other components within normal limits  URINALYSIS, ROUTINE W REFLEX MICROSCOPIC - Abnormal; Notable for the following:    Color, Urine AMBER (*)    Specific Gravity, Urine 1.037 (*)    Bilirubin Urine SMALL (*)    All other components within normal limits  LIPASE, BLOOD    Imaging Review Ct Abdomen Pelvis Wo Contrast  10/01/2014   CLINICAL DATA:  Left-sided abdominal pain. Nephrectomy 10 years ago for cancer. Nausea and vomiting.  EXAM: CT ABDOMEN AND PELVIS WITHOUT CONTRAST  TECHNIQUE: Multidetector CT imaging of the abdomen and pelvis was performed following the standard protocol without IV contrast.  COMPARISON:  08/07/2011  FINDINGS: Lower chest: Bilateral calcified pleural plaques. Paraseptal emphysema with bulla along the inferior margin of the right major fissure. Prior CABG.  Hepatobiliary: Trace fluid along the posterior right hepatic lobe margin.  Pancreas: Unremarkable  Spleen: Unremarkable  Adrenals/Urinary Tract: Left nephrectomy, no local recurrence.  Stomach/Bowel: Several mildly dilated loops of contrast filled small bowel in the left abdomen measuring up to 3.2 cm diameter, Trace amount of adjacent fluid, progressive dilution of contrast medium in central pelvic small bowel loop with  mild bowel wall thickening in this vicinity but without an obvious lead point for obstruction separating the mildly dilated bowel from the nondilated bowel. Appendix not well seen. Fatty deposition in the wall of the ascending colon. Scattered diverticula in the sigmoid colon.  Vascular/Lymphatic: Aortoiliac atherosclerotic vascular disease.  Reproductive: Unremarkable  Other: No supplemental non-categorized findings.  Musculoskeletal: Postoperative findings in the lumbar spine with posterolateral rod and pedicle screw fixation at L3-L4-L5 bilaterally, and chronic appearing pars defects at L5. Grade 1 retrolisthesis at L2-3.  IMPRESSION: 1. There is some abnormal dilated loops of left abdominal small bowel, with progressive dilution and with mild small bowel wall thickening and some scattered minimal ascites, but no obvious lead point for obstruction. This could be a manifestation of enteritis. 2. No compelling evidence of recurrent malignancy. 3. Calcified pleural plaque suggesting remote granulomatous disease. 4. Paraseptal emphysema. 5. Atherosclerosis. 6. 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   Electronically Signed   By: Sherryl Barters M.D.   On: 10/01/2014 13:52     EKG Interpretation None      MDM   Final diagnoses:  Abdominal pain    Patient with left lower quadrant abdominal pain that started yesterday and persisted to today with one episode of vomiting but persistent nausea and anorexia. He denies change in bowel movements or urinary habits. His urine has not changed in color and states that he had a prior left-sided nephrectomy.  Patient seen in urgent care and sent here for further evaluation.  Patient has significant left lower quadrant pain on exam concern for diverticulitis. 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  CBC, CMP, lipase, UA, CT scan with oral contrast only pending  2:47 PM Labs with a white blood cell count of 14,000 but  otherwise within normal limits. CT showing possible enteritis versus small bowel obstruction. Patient does not have findings consistent with small bowel obstruction as he had a normal bowel movement last night and has been passing gas today as well as only one episode of vomiting. Discussed patient and findings with Dr. Grandville Silos who evaluated the CT and felt that enteritis was more likely based on CT findings.  2:48  PM Findings discussed with patient and his wife.  Will do supportive care with pain and nausea meds and f/u with PCP on Monday or tues if symptoms persist.  Pt given strict return precautions.  Blanchie Dessert, MD 10/01/14 1451

## 2014-10-01 NOTE — ED Notes (Signed)
Reports left lower quadrant pain yesterday.  Pain gradually getting worse.  Regular BM.   One vomiting episode yesterday.   Fatigue.

## 2014-10-01 NOTE — ED Notes (Signed)
Pt sent here from [facility] for left side abd pain that started yesterday. Had n/v x 1. Last bm was yesterday and normal per pt.

## 2014-10-01 NOTE — ED Provider Notes (Signed)
CSN: [id number]     Arrival date & time 10/01/14  9702 History   First MD Initiated Contact with Patient 10/01/14 [phone]     Chief Complaint  Patient presents with  . Abdominal Pain   (Consider location/radiation/quality/duration/timing/severity/associated sxs/prior Treatment) Patient is a 74 y.o. male presenting with abdominal pain. The history is provided by the patient and the spouse.  Abdominal Pain Pain location:  LLQ Pain quality: cramping   Pain radiates to:  Does not radiate Pain severity:  Moderate (8/10 level now , was a 35 last eve.) Onset quality:  Gradual Duration:  12 hours Progression:  Worsening Chronicity:  New Associated symptoms: nausea and vomiting   Associated symptoms: no chills, no constipation, no diarrhea, no dysuria, no fever, no hematemesis, no hematochezia, no hematuria and no melena   Risk factors: being elderly and multiple surgeries   Risk factors comment:  S/p colonoscopy--wnl..   Past Medical History  Diagnosis Date  . GERD (gastroesophageal reflux disease)   . Hyperlipidemia   . Hypertension   . Coronary artery disease     Dr. Johnsie Cancel,  stent to LAD  . Hypernephroma 2004    Dr Rosana Hoes  . Abnormal nuclear stress test 11/16/2013   Past Surgical History  Procedure Laterality Date  . Appendectomy    . Coronary artery bypass graft  2000    Dr. Roxan Hockey 2001  . Lumbar laminectomy      x 4  . 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 Cervical laminectomy      x 1  . Colonscopy  2006    Dr. Delfin Edis; negative  . Knee surgery  3-4 surgeries    bone moved from hip to under knee cap  . Cardiac catheterization  11/01/2011    Dr Johnsie Cancel  . Coronary angioplasty with stent placement  1990's; 11/15/2013    "today's make a total of 3" (11/15/2013)  . Tonsillectomy    . Lumbar fusion  06/08/2013    [place]  . Coronary angioplasty with stent placement  11/15/13    Promus to prox. LAD  . Colonoscopy with polypectomy  2014    Dr Paulita Fujita  . Left heart  catheterization with coronary/graft angiogram  11/01/2011    Procedure: LEFT HEART CATHETERIZATION WITH Beatrix Fetters;  Surgeon: Josue Hector, MD;  Location: [facility] CATH LAB;  Service: Cardiovascular;;  . Left heart catheterization with coronary/graft angiogram N/A 11/15/2013    Procedure: LEFT HEART CATHETERIZATION WITH Beatrix Fetters;  Surgeon: Burnell Blanks, MD;  Location: [facility] CATH LAB;  Service: Cardiovascular;  Laterality: N/A;   Family History  Problem Relation Age of Onset  . Diabetes Mother   . Coronary artery disease Mother   . Heart attack Father 38  . Heart attack Brother 88  . Heart attack Brother     in his 84s  . Cancer Maternal Grandmother     uterine cancer  . Stroke Neg Hx   . Coronary artery disease Other     M & P aunts/uncles   History  Substance Use Topics  . Smoking status: Former Smoker -- 1.00 packs/day for 38 years    Types: Cigarettes    Quit date: 10/01/1996  . Smokeless tobacco: Never Used     Comment: smoked 1960-1998, up 1 ppd  . Alcohol Use: No    Review of Systems  Constitutional: Positive for appetite change. Negative for fever and chills.  Gastrointestinal: Positive for nausea, vomiting and abdominal pain.  Negative for diarrhea, constipation, blood in stool, melena, hematochezia, rectal pain and hematemesis.  Genitourinary: Negative.  Negative for dysuria, hematuria and flank pain.    Allergies  Codeine and Tramadol  Home Medications   Prior to Admission medications   Medication Sig Start Date End Date Taking? Authorizing Provider  aspirin EC 81 MG tablet Take 81 mg by mouth daily.   Yes Historical Provider, MD  atorvastatin (LIPITOR) 80 MG tablet TAKE 1 TABLET AT BEDTIME 08/03/14  Yes Josue Hector, MD  benzonatate (TESSALON) 200 MG capsule Take 1 capsule (200 mg total) by mouth 3 (three) times daily as needed for cough. 09/07/14  Yes Hendricks Limes, MD  doxycycline (VIBRA-TABS) 100 MG tablet Take 1 tablet (100  mg total) by mouth 2 (two) times daily. 09/07/14  Yes Hendricks Limes, MD  fish oil-omega-3 fatty acids 1000 MG capsule Take 2 g by mouth daily.    Yes Historical Provider, MD  metoprolol succinate (TOPROL-XL) 50 MG 24 hr tablet Take 1 tablet (50 mg total) by mouth daily. Take with or immediately following a meal. 03/17/14  Yes Josue Hector, MD  Multiple Vitamin (MULTIVITAMIN) tablet Take 1 tablet by mouth daily.     Yes Historical Provider, MD  nitroGLYCERIN (NITROSTAT) 0.4 MG SL tablet Place 0.4 mg under the tongue every 5 (five) minutes as needed for chest pain.   Yes Historical Provider, MD  prasugrel (EFFIENT) 10 MG TABS tablet Take 1 tablet (10 mg total) by mouth daily. 01/04/14  Yes Josue Hector, MD   BP 133/73 mmHg  Pulse 83  Temp(Src) 97.7 F (36.5 C) (Oral)  Resp 18  SpO2 96% Physical Exam  Constitutional: He is oriented to person, place, and time. He appears well-developed and well-nourished. He appears distressed.  Abdominal: Soft. Normal appearance. He exhibits no mass. Bowel sounds are increased. There is tenderness in the left lower quadrant. There is no rigidity, no rebound, no guarding, no CVA tenderness, no tenderness at McBurney's point and negative Murphy's sign.  Neurological: He is alert and oriented to person, place, and time.  Skin: Skin is warm and dry.  Nursing note and vitals reviewed.   ED Course  Procedures (including critical care time) Labs Review Labs Reviewed - No data to display  Imaging Review No results found.   MDM   1. Abdominal pain, acute, left lower quadrant    Sent for eval of llq pain --likely divert. , has had colonoscopy.Billy Fischer, MD 10/01/14 1004

## 2014-10-03 ENCOUNTER — Other Ambulatory Visit: Payer: Self-pay | Admitting: *Deleted

## 2014-10-03 ENCOUNTER — Telehealth: Payer: Self-pay | Admitting: *Deleted

## 2014-10-03 MED ORDER — METOPROLOL SUCCINATE ER 50 MG PO TB24: 50.0000 mg | ORAL_TABLET | Freq: Every day | ORAL | Status: DC

## 2014-10-03 NOTE — Telephone Encounter (Signed)
Patients wife would like to know when in February patient can stop effient. She also stated that Dr Johnsie Cancel had mentioned him starting asa 325mg  when he stops taking the effient. Please advise. Thanks, MI

## 2014-10-04 NOTE — Telephone Encounter (Signed)
WILL FORWARD TO DR NISHAN FOR  REVIEW./CY 

## 2014-10-05 NOTE — Telephone Encounter (Signed)
11/15/14 can stop effient  Start ASA

## 2014-10-06 NOTE — Telephone Encounter (Signed)
PT  NOTIFIED ./CY 

## 2014-10-12 ENCOUNTER — Telehealth: Payer: Self-pay | Admitting: Internal Medicine

## 2014-10-12 NOTE — Telephone Encounter (Signed)
Pt wife called in, Trying to get in to GI dr asap.  Need [employer] Referral     Dr. Arta Silence, MD   Gastroenterologist  Address: [address] [place], [place], [place] [address]  [phone]  Ask for christina

## 2014-10-12 NOTE — Telephone Encounter (Signed)
Auth #[id number] valid 10/12/14-04/10/15 for 6 visits

## 2014-10-31 ENCOUNTER — Telehealth: Payer: Self-pay | Admitting: Internal Medicine

## 2014-10-31 ENCOUNTER — Other Ambulatory Visit: Payer: Self-pay | Admitting: Internal Medicine

## 2014-10-31 DIAGNOSIS — E1151 Type 2 diabetes mellitus with diabetic peripheral angiopathy without gangrene: Secondary | ICD-10-CM

## 2014-10-31 NOTE — Telephone Encounter (Signed)
Order entered; it will expire 4/29

## 2014-10-31 NOTE — Telephone Encounter (Signed)
Pt request [employer] referral to go to Myelogram at  imaging 11/23/14 and eye doctor, Dr. Herbert Deaner, on 12/19/14. Please advise.

## 2014-10-31 NOTE — Telephone Encounter (Signed)
Pt called request lab order to be done in April for A1c and Microalbumine. Please advise.

## 2014-11-03 NOTE — Telephone Encounter (Signed)
Dr Herbert Deaner appt 12/19/14   Referral #[id number]  start date 12/19/14, exp 06/17/15  good for 6 visits.  Dr Joya Salm office need to do referral for Myelogram at [facility] imaging, pt aware

## 2014-11-23 ENCOUNTER — Ambulatory Visit
Admission: RE | Admit: 2014-11-23 | Discharge: 2014-11-23 | Disposition: A | Discharge status: Home / Self Care | Payer: Commercial Managed Care - HMO | Source: Ambulatory Visit | Attending: Neurosurgery | Admitting: Neurosurgery

## 2014-11-23 DIAGNOSIS — M5416 Radiculopathy, lumbar region: Secondary | ICD-10-CM

## 2014-11-23 MED ORDER — IOHEXOL 180 MG/ML  SOLN
15.0000 mL | Freq: Once | INTRAMUSCULAR | Status: AC | PRN
  Administered 2014-11-23: 15 mL via INTRAVENOUS

## 2014-11-23 MED ORDER — MEPERIDINE HCL 100 MG/ML IJ SOLN
50.0000 mg | Freq: Once | INTRAMUSCULAR | Status: AC
  Administered 2014-11-23: 50 mg via INTRAMUSCULAR

## 2014-11-23 MED ORDER — ONDANSETRON HCL 4 MG/2ML IJ SOLN: 4.0000 mg | Freq: Four times a day (QID) | INTRAMUSCULAR | Status: DC | PRN

## 2014-11-23 MED ORDER — DIAZEPAM 5 MG PO TABS
5.0000 mg | ORAL_TABLET | Freq: Once | ORAL | Status: AC
  Administered 2014-11-23: 5 mg via ORAL

## 2014-11-23 MED ORDER — ONDANSETRON HCL 4 MG/2ML IJ SOLN
4.0000 mg | Freq: Once | INTRAMUSCULAR | Status: AC
  Administered 2014-11-23: 4 mg via INTRAMUSCULAR

## 2014-11-23 NOTE — Progress Notes (Signed)
Wife at bedside in recovery area.  Patient resting quietly without complaint at present.  jkl

## 2014-11-23 NOTE — Progress Notes (Signed)
Patient states he has been off Effient for the past week and is no longer on it.  jkl

## 2014-11-23 NOTE — Discharge Instructions (Signed)

## 2014-12-19 ENCOUNTER — Telehealth: Payer: Self-pay | Admitting: Internal Medicine

## 2014-12-19 NOTE — Telephone Encounter (Signed)
Is requesting [employer] Referral: Dr. Bonna Gains - [facility] - back issues/nerves/pain management - 01/17/15 at 2:00pm

## 2014-12-20 NOTE — Telephone Encounter (Signed)
[employer] Josem Kaufmann #[id number] valid 01/17/2015 - 07/16/2015 for 6 visits

## 2015-01-03 ENCOUNTER — Other Ambulatory Visit (INDEPENDENT_AMBULATORY_CARE_PROVIDER_SITE_OTHER): Payer: Commercial Managed Care - HMO

## 2015-01-03 DIAGNOSIS — E1151 Type 2 diabetes mellitus with diabetic peripheral angiopathy without gangrene: Secondary | ICD-10-CM

## 2015-01-03 LAB — HEMOGLOBIN A1C: Hgb A1c MFr Bld: 6.2 % (ref 4.6–6.5)

## 2015-01-03 LAB — MICROALBUMIN / CREATININE URINE RATIO
Creatinine,U: 249 mg/dL
Microalb Creat Ratio: 0.7 mg/g (ref 0.0–30.0)
Microalb, Ur: 1.8 mg/dL (ref 0.0–1.9)

## 2015-01-10 IMAGING — CT CT L SPINE W/ CM
4 of 11 series · 11 of 33 positions shown, 13 images · non-contrast
Comparison: 02/03/2013

CLINICAL DATA: Back pains.  Lower extremity veins.

CT MYELOGRAPHY LUMBAR SPINE
TECHNIQUE: CT imaging of the lumbar spine was performed after
intrathecal contrast administration.  Multiplanar CT image
reconstructions were also generated.

[Series 2: l spine bone · axial · 0.27mm/px · z∈[-377,-107]mm · 3 of 109 slices shown, 4 images]
[im 1/109  soft-tissue]
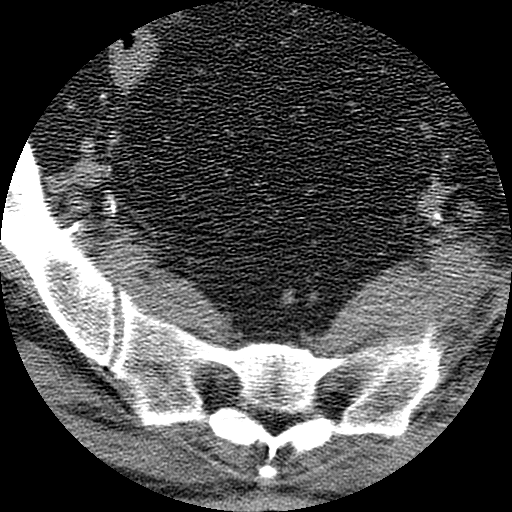
[im 1/109  bone]
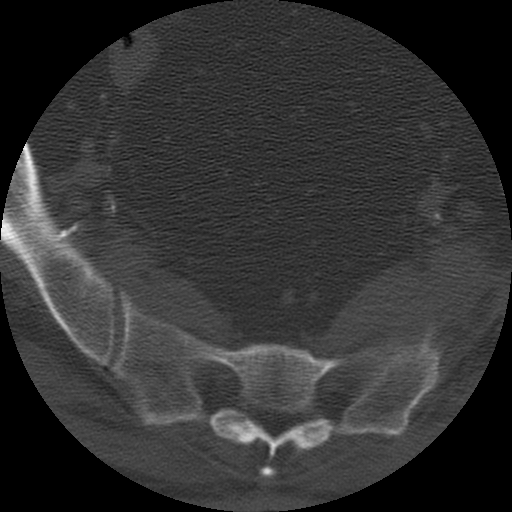
[im 55/109  bone]
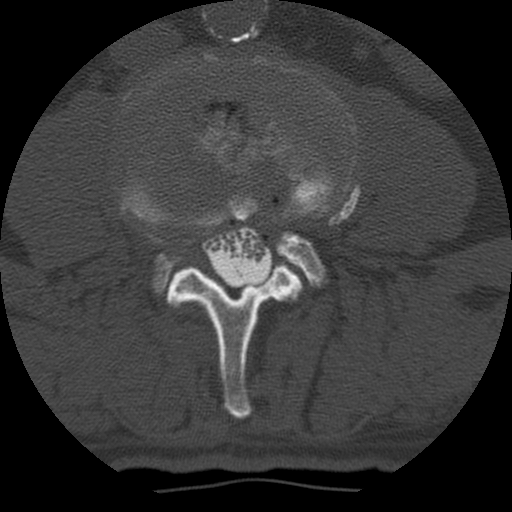
[im 109/109  bone]
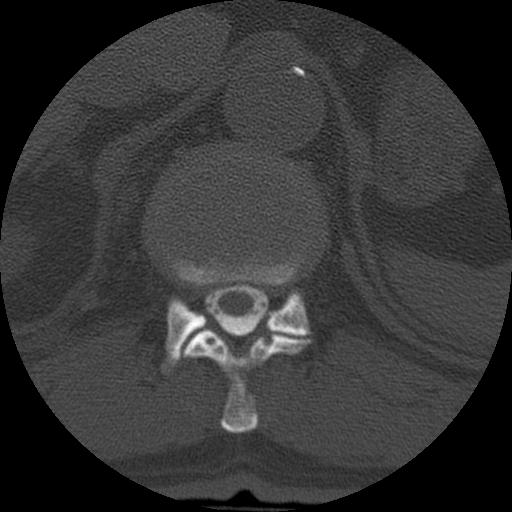

[Series 3: l spine soft · axial · 0.27mm/px · z∈[-287,-197]mm · 2 of 108 slices shown]
[im 36/108  soft-tissue]
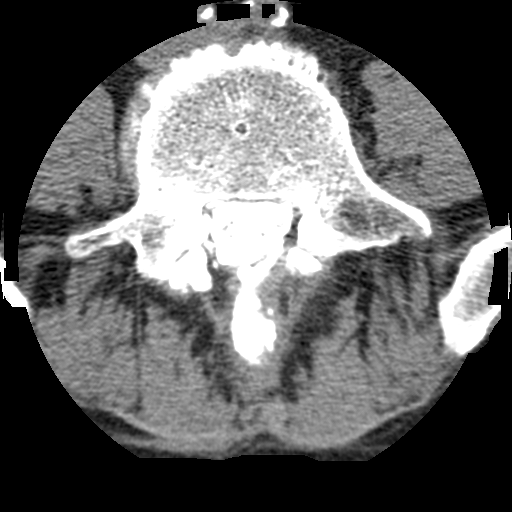
[im 72/108  soft-tissue]
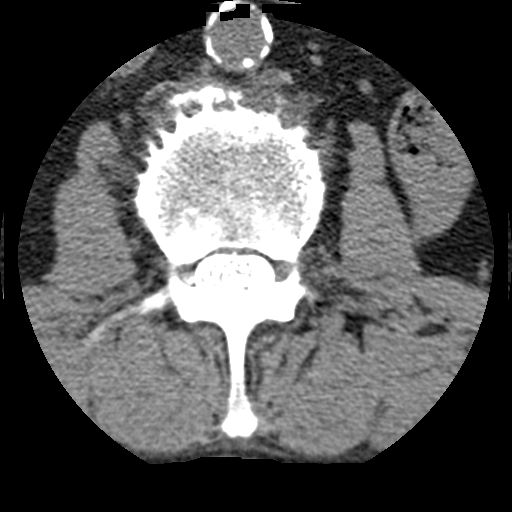

[Series 401: cor lower · coronal · 0.54mm/px · 1 of 61 slices shown]
[im 31/61  bone]
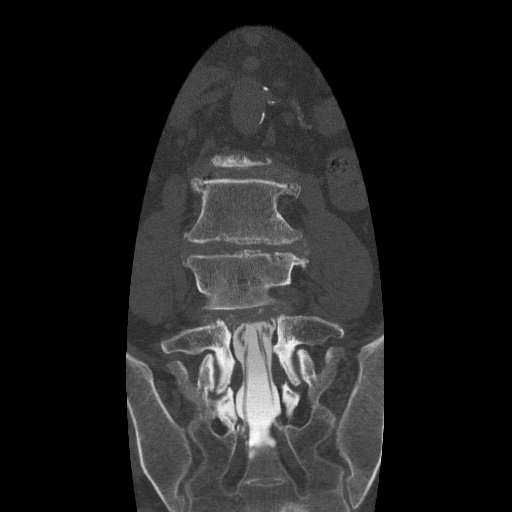

[Series 402: sag · sagittal · 0.54mm/px · 5 of 61 slices shown, 6 images]
[im 21/61  bone]
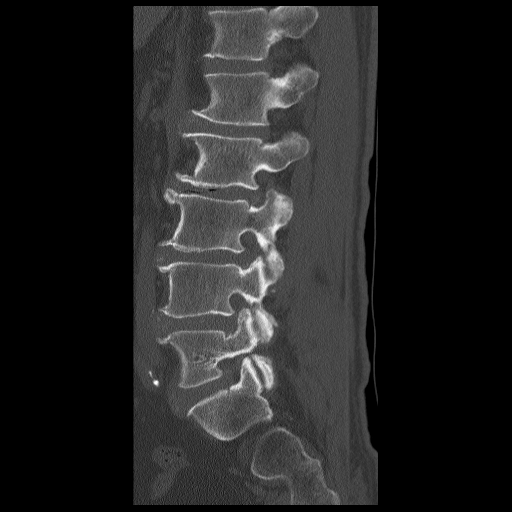
[im 26/61  bone]
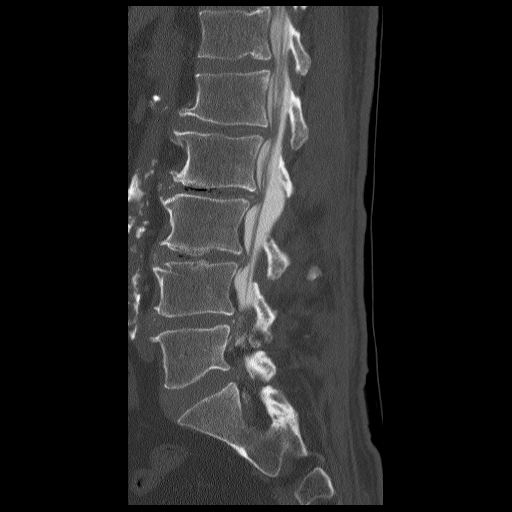
[im 31/61  soft-tissue]
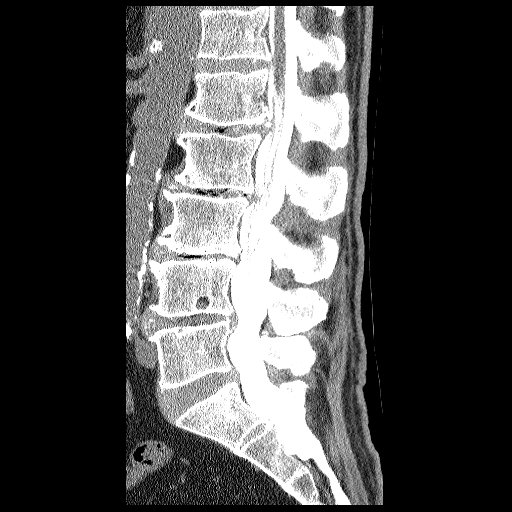
[im 31/61  bone]
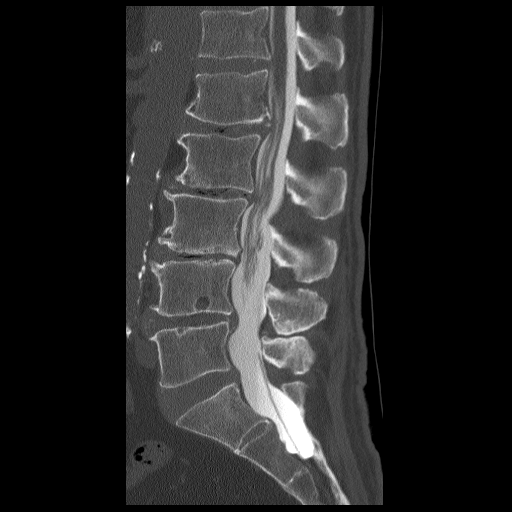
[im 36/61  bone]
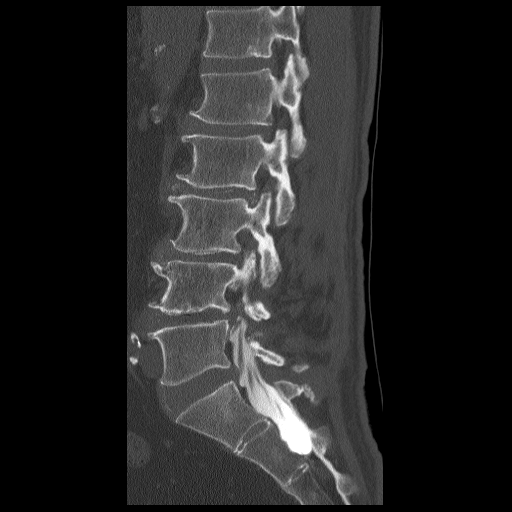
[im 41/61  bone]
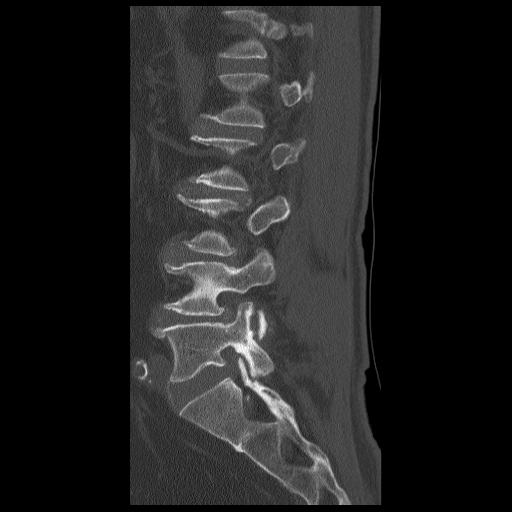

[11 of 33 positions shown; findings below may reference images not displayed]

FINDINGS: Radiologist injection.

Anatomic alignment.  No vertebral compression deformity.  No pars
defect.  Prominent vascular calcifications.  Left kidney is
surgically absent.

L1-L2:  Shallow left paracentral protrusion.  There are associated
annular osteophytes.  Slight encroachment upon the left lateral
recess.  No central stenosis.  Foramina patent.

L2-3:  Moderate narrowing of the disc with vacuum.  Moderate
concentric disc bulge with some encroachment of the lateral
recesses right greater than left.  Right laminectomy has been
performed in the central canal is widely patent.  Foramina are
patent.

L3-4:  Severe narrowing of the disc with vacuum.  Soft tissue
density is present in the left lateral recess contiguous with the
disc worrisome for a small extrusion.  This encroaches upon the
left L4 nerve root.  Mild right neural foraminal and moderate left
neural foraminal narrowing are multifactorial.  No evidence of
central stenosis.

L4-5:  Moderate concentric disc bulge.  Right laminectomy defect is
suspected.  There is calcific and soft tissue density in the right
lateral recess either due to disc extrusion or epidural fibrosis.
This causes significant narrowing of the right lateral recess with
right L5 nerve root encroachment.  Mild foraminal narrowing
bilaterally.  Central canal is minimally narrowed.

L5-S1:  Right laminectomy defect.  Central canal is widely patent.
Shallow central protrusion mildly narrows the lateral recesses
bilaterally right greater than left. Moderate bilateral foraminal
narrowing at this level.  It is multifactorial.
IMPRESSION: Multilevel degenerative disc disease as described.

Small extrusion at L3-4 at the left lateral recess is suspected
correlating with the MRI.

Epidural fibrosis or disc extrusion in the right lateral recess at
L4-5.  Bilateral foraminal narrowing is present at this level.

Mild bilateral lateral recess narrowing and moderate foraminal
narrowing at L5-S1.

## 2015-02-10 ENCOUNTER — Ambulatory Visit (INDEPENDENT_AMBULATORY_CARE_PROVIDER_SITE_OTHER): Payer: Commercial Managed Care - HMO | Admitting: Nurse Practitioner

## 2015-02-10 ENCOUNTER — Encounter: Payer: Self-pay | Admitting: Nurse Practitioner

## 2015-02-10 VITALS — BP 150/82 | HR 79 | Temp 97.4°F | Resp 12 | Ht 69.0 in | Wt 174.8 lb

## 2015-02-10 DIAGNOSIS — B9789 Other viral agents as the cause of diseases classified elsewhere: Principal | ICD-10-CM

## 2015-02-10 DIAGNOSIS — J069 Acute upper respiratory infection, unspecified: Secondary | ICD-10-CM | POA: Diagnosis not present

## 2015-02-10 NOTE — Progress Notes (Signed)
Pre visit review using our clinic review tool, if applicable. No additional management support is needed unless otherwise documented below in the visit note. 

## 2015-02-10 NOTE — Patient Instructions (Signed)
Mucinex plain, Flonase nasal spray, increase water intake, tylenol for headaches, Delsym cough syrup. No signs of infection, so an antibiotic is not recommended at this time.   Call us if not improved after 10 days of symptoms.   Upper Respiratory Infection, Adult An upper respiratory infection (URI) is also sometimes known as the common cold. The upper respiratory tract includes the nose, sinuses, throat, trachea, and bronchi. Bronchi are the airways leading to the lungs. Most people improve within 1 week, but symptoms can last up to 2 weeks. A residual cough may last even longer.  CAUSES Many different viruses can infect the tissues lining the upper respiratory tract. The tissues become irritated and inflamed and often become very moist. Mucus production is also common. A cold is contagious. You can easily spread the virus to others by oral contact. This includes kissing, sharing a glass, coughing, or sneezing. Touching your mouth or nose and then touching a surface, which is then touched by another person, can also spread the virus. SYMPTOMS  Symptoms typically develop 1 to 3 days after you come in contact with a cold virus. Symptoms vary from person to person. They may include:  Runny nose.  Sneezing.  Nasal congestion.  Sinus irritation.  Sore throat.  Loss of voice (laryngitis).  Cough.  Fatigue.  Muscle aches.  Loss of appetite.  Headache.  Low-grade fever. DIAGNOSIS  You might diagnose your own cold based on familiar symptoms, since most people get a cold 2 to 3 times a year. Your caregiver can confirm this based on your exam. Most importantly, your caregiver can check that your symptoms are not due to another disease such as strep throat, sinusitis, pneumonia, asthma, or epiglottitis. Blood tests, throat tests, and X-rays are not necessary to diagnose a common cold, but they may sometimes be helpful in excluding other more serious diseases. Your caregiver will decide if  any further tests are required. RISKS AND COMPLICATIONS  You may be at risk for a more severe case of the common cold if you smoke cigarettes, have chronic heart disease (such as heart failure) or lung disease (such as asthma), or if you have a weakened immune system. The very young and very old are also at risk for more serious infections. Bacterial sinusitis, middle ear infections, and bacterial pneumonia can complicate the common cold. The common cold can worsen asthma and chronic obstructive pulmonary disease (COPD). Sometimes, these complications can require emergency medical care and may be life-threatening. PREVENTION  The best way to protect against getting a cold is to practice good hygiene. Avoid oral or hand contact with people with cold symptoms. Wash your hands often if contact occurs. There is no clear evidence that vitamin C, vitamin E, echinacea, or exercise reduces the chance of developing a cold. However, it is always recommended to get plenty of rest and practice good nutrition. TREATMENT  Treatment is directed at relieving symptoms. There is no cure. Antibiotics are not effective, because the infection is caused by a virus, not by bacteria. Treatment may include:  Increased fluid intake. Sports drinks offer valuable electrolytes, sugars, and fluids.  Breathing heated mist or steam (vaporizer or shower).  Eating chicken soup or other clear broths, and maintaining good nutrition.  Getting plenty of rest.  Using gargles or lozenges for comfort.  Controlling fevers with ibuprofen or acetaminophen as directed by your caregiver.  Increasing usage of your inhaler if you have asthma. Zinc gel and zinc lozenges, taken in the first 24  hours of the common cold, can shorten the duration and lessen the severity of symptoms. Pain medicines may help with fever, muscle aches, and throat pain. A variety of non-prescription medicines are available to treat congestion and runny nose. Your  caregiver can make recommendations and may suggest nasal or lung inhalers for other symptoms.  HOME CARE INSTRUCTIONS   Only take over-the-counter or prescription medicines for pain, discomfort, or fever as directed by your caregiver.  Use a warm mist humidifier or inhale steam from a shower to increase air moisture. This may keep secretions moist and make it easier to breathe.  Drink enough water and fluids to keep your urine clear or pale yellow.  Rest as needed.  Return to work when your temperature has returned to normal or as your caregiver advises. You may need to stay home longer to avoid infecting others. You can also use a face mask and careful hand washing to prevent spread of the virus. SEEK MEDICAL CARE IF:   After the first few days, you feel you are getting worse rather than better.  You need your caregiver's advice about medicines to control symptoms.  You develop chills, worsening shortness of breath, or brown or red sputum. These may be signs of pneumonia.  You develop yellow or brown nasal discharge or pain in the face, especially when you bend forward. These may be signs of sinusitis.  You develop a fever, swollen neck glands, pain with swallowing, or white areas in the back of your throat. These may be signs of strep throat. SEEK IMMEDIATE MEDICAL CARE IF:   You have a fever.  You develop severe or persistent headache, ear pain, sinus pain, or chest pain.  You develop wheezing, a prolonged cough, cough up blood, or have a change in your usual mucus (if you have chronic lung disease).  You develop sore muscles or a stiff neck. Document Released: 03/12/2001 Document Revised: 12/09/2011 Document Reviewed: 12/22/2013 [facility] Patient Information 2015 [place], [place]. This information is not intended to replace advice given to you by your health care provider. Make sure you discuss any questions you have with your health care provider.

## 2015-02-10 NOTE — Progress Notes (Signed)
   Subjective:    Patient ID: Curtis Galloway, male    DOB: [birth year], 74 y.o.   MRN: [id number]  HPI  Mr. Errico is a 74 yo male with a CC of cough, scratchy throat, ear pressure, nasal drainage x 2 days.   1) Cough, headache, scratchy throat, ear pressure, nasal drainage- clear,  Tylenol- not helpful   Review of Systems  Constitutional: Negative for fever, chills, diaphoresis and fatigue.  HENT: Positive for ear pain, rhinorrhea and sore throat.   Eyes: Negative for visual disturbance.  Respiratory: Positive for cough. Negative for chest tightness, shortness of breath and wheezing.   Cardiovascular: Negative for chest pain, palpitations and leg swelling.  Gastrointestinal: Negative for nausea, vomiting and diarrhea.  Skin: Negative for rash.  Neurological: Positive for headaches. Negative for dizziness, weakness and numbness.  Psychiatric/Behavioral: The patient is not nervous/anxious.       Objective:   Physical Exam  Constitutional: He is oriented to person, place, and time. He appears well-developed and well-nourished. No distress.  BP 150/82 mmHg  Pulse 79  Temp(Src) 97.4 F (36.3 C) (Oral)  Resp 12  Ht '5\' 9"'$  (1.753 m)  Wt 174 lb 12.8 oz (79.289 kg)  BMI 25.80 kg/m2  SpO2 95%   HENT:  Head: Normocephalic and atraumatic.  Right Ear: External ear normal.  Left Ear: External ear normal.  TM's clear bilaterally  Eyes: EOM are normal. Pupils are equal, round, and reactive to light. Right eye exhibits no discharge. Left eye exhibits no discharge. No scleral icterus.  Neck: Normal range of motion. Neck supple. No thyromegaly present.  Cardiovascular: Normal rate, regular rhythm, normal heart sounds and intact distal pulses.  Exam reveals no gallop and no friction rub.   No murmur heard. Pulmonary/Chest: Effort normal and breath sounds normal. No respiratory distress. He has no wheezes. He has no rales. He exhibits no tenderness.  Lymphadenopathy:    He has no cervical  adenopathy.  Neurological: He is alert and oriented to person, place, and time.  Skin: Skin is warm and dry. No rash noted. He is not diaphoretic.  Psychiatric: He has a normal mood and affect. His behavior is normal. Judgment and thought content normal.      Assessment & Plan:  Viral URI w/ cough  1) Plain Mucinex OTC, flonase nasal spray OTC 2) Increase water intake and use tylenol for HA 3) Delsym cough syrup OTC 4) Call if not improved after 10 days of symptoms

## 2015-03-06 ENCOUNTER — Other Ambulatory Visit: Payer: Self-pay | Admitting: Urology

## 2015-03-06 ENCOUNTER — Ambulatory Visit
Admission: RE | Admit: 2015-03-06 | Discharge: 2015-03-06 | Disposition: A | Discharge status: Home / Self Care | Payer: Commercial Managed Care - HMO | Source: Ambulatory Visit | Attending: Urology | Admitting: Urology

## 2015-03-06 DIAGNOSIS — Z139 Encounter for screening, unspecified: Secondary | ICD-10-CM

## 2015-03-13 NOTE — Progress Notes (Signed)
Patient ID: Curtis Galloway, male   DOB: 1941/04/21, 74 y.o.   MRN: [id number] 44 y.o. f/u CAD . Cath  2013 showed occluded grafts and had stent to LAD .No angina. Wallks daily. Intolerant to nitrates with headache   Compliant with meds   Cath revealed:  1. 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  2. Unstable angina secondary to severe hazy stenosis in proximal LAD  3. Successful PTCA/DES x 1 proximal LAD  4. Preserved LV systolic function with akinesis of the inferior wall at the base   Grafts:  SVG: to PDA occluded New since 2003  SVG: Diagonal occluded New since 2003  Free Rima: Came off diagonal graft atretic in 2003  [place]: Known to be occluded cath 2003   Effient stopped l 2/16  Needs pain management for back  Having more chest pain. Stubborn about taking nitro for  Pain at rest and with exertion last few weeks  Compliant with meds   ROS: Denies fever, malais, weight loss, blurry vision, decreased visual acuity, cough, sputum, SOB, hemoptysis, pleuritic pain, palpitaitons, heartburn, abdominal pain, melena, lower extremity edema, claudication, or rash.  All other systems reviewed and negative  General: Affect appropriate Healthy:  appears stated age 25: normal Neck supple with no adenopathy JVP normal no bruits no thyromegaly Lungs clear with no wheezing and good diaphragmatic motion Heart:  S1/S2 no murmur, no rub, gallop or click PMI normal Abdomen: benighn, BS positve, no tenderness, no AAA no bruit.  No HSM or HJR Distal pulses intact with no bruits No edema Neuro non-focal Skin warm and dry No muscular weakness   Current Outpatient Prescriptions  Medication Sig Dispense Refill  . aspirin 325 MG tablet Take 325 mg by mouth daily.    Marland Kitchen atorvastatin (LIPITOR) 80 MG tablet Take 1 tablet (80 mg total) by mouth at  bedtime. 90 tablet 3  . fish oil-omega-3 fatty acids 1000 MG capsule Take 2 g by mouth daily.     . metoprolol succinate (TOPROL-XL) 50 MG 24 hr tablet Take 1 tablet (50 mg total) by mouth daily. Take with or immediately following a meal. 90 tablet 3  . Multiple Vitamin (MULTIVITAMIN) tablet Take 1 tablet by mouth daily.      . nitroGLYCERIN (NITROSTAT) 0.4 MG SL tablet Place 0.4 mg under the tongue every 5 (five) minutes as needed for chest pain.     No current facility-administered medications for this visit.    Allergies  Codeine and Tramadol  Electrocardiogram:  2/15  SR rate 94 nonspecific ST/T wave changes   Assessment and Plan CAD:  Distant CABG with occluded grafts.  Occluded native RCA with stent in native LAD.  Will arrange cath with Dr Martinique tomorrow Continue beta blocker ASA/  Will not load with Effient as he may need repeat CABG.  Encouraged him to take nitro if needed or go to  ER for prlonge pain  Chol:  On statin

## 2015-03-15 ENCOUNTER — Encounter: Payer: Self-pay | Admitting: Cardiovascular Disease

## 2015-03-15 ENCOUNTER — Ambulatory Visit (INDEPENDENT_AMBULATORY_CARE_PROVIDER_SITE_OTHER): Payer: Commercial Managed Care - HMO | Admitting: Cardiovascular Disease

## 2015-03-15 ENCOUNTER — Encounter: Payer: Self-pay | Admitting: *Deleted

## 2015-03-15 ENCOUNTER — Other Ambulatory Visit: Payer: Self-pay | Admitting: Cardiovascular Disease

## 2015-03-15 ENCOUNTER — Telehealth: Payer: Self-pay | Admitting: Cardiovascular Disease

## 2015-03-15 VITALS — BP 142/72 | HR 74 | Ht 69.0 in | Wt 172.8 lb

## 2015-03-15 DIAGNOSIS — I2510 Atherosclerotic heart disease of native coronary artery without angina pectoris: Secondary | ICD-10-CM | POA: Diagnosis not present

## 2015-03-15 DIAGNOSIS — Z01812 Encounter for preprocedural laboratory examination: Secondary | ICD-10-CM | POA: Diagnosis not present

## 2015-03-15 DIAGNOSIS — I2583 Coronary atherosclerosis due to lipid rich plaque: Principal | ICD-10-CM

## 2015-03-15 LAB — CBC WITH DIFFERENTIAL/PLATELET
BASOS PCT: 0.5 % (ref 0.0–3.0)
Basophils Absolute: 0 10*3/uL (ref 0.0–0.1)
EOS PCT: 1.6 % (ref 0.0–5.0)
Eosinophils Absolute: 0.1 10*3/uL (ref 0.0–0.7)
HCT: 38.4 % — ABNORMAL LOW (ref 39.0–52.0)
Hemoglobin: 12.9 g/dL — ABNORMAL LOW (ref 13.0–17.0)
Lymphocytes Relative: 22.3 % (ref 12.0–46.0)
Lymphs Abs: 1.7 10*3/uL (ref 0.7–4.0)
MCHC: 33.5 g/dL (ref 30.0–36.0)
MCV: 90.6 fl (ref 78.0–100.0)
MONOS PCT: 9.7 % (ref 3.0–12.0)
Monocytes Absolute: 0.7 10*3/uL (ref 0.1–1.0)
Neutro Abs: 5.1 10*3/uL (ref 1.4–7.7)
Neutrophils Relative %: 65.9 % (ref 43.0–77.0)
Platelets: 222 10*3/uL (ref 150.0–400.0)
RBC: 4.24 Mil/uL (ref 4.22–5.81)
RDW: 14.7 % (ref 11.5–15.5)
WBC: 7.7 10*3/uL (ref 4.0–10.5)

## 2015-03-15 LAB — BASIC METABOLIC PANEL
BUN: 11 mg/dL (ref 6–23)
CALCIUM: 8.8 mg/dL (ref 8.4–10.5)
CO2: 31 mEq/L (ref 19–32)
Chloride: 106 mEq/L (ref 96–112)
Creatinine, Ser: 1.11 mg/dL (ref 0.40–1.50)
GFR: 69.14 mL/min (ref >60.00)
GLUCOSE: 95 mg/dL (ref 70–99)
Potassium: 3.6 mEq/L (ref 3.5–5.1)
SODIUM: 140 meq/L (ref 135–145)

## 2015-03-15 LAB — PROTIME-INR
INR: 1 ratio (ref 0.8–1.0)
Prothrombin Time: 11.5 s (ref 9.6–13.1)

## 2015-03-15 MED ORDER — ATORVASTATIN CALCIUM 80 MG PO TABS: 80.0000 mg | ORAL_TABLET | Freq: Every day | ORAL | Status: DC

## 2015-03-15 MED ORDER — METOPROLOL SUCCINATE ER 50 MG PO TB24: 50.0000 mg | ORAL_TABLET | Freq: Every day | ORAL | Status: DC

## 2015-03-15 MED ORDER — NITROGLYCERIN 0.4 MG SL SUBL: 0.4000 mg | SUBLINGUAL_TABLET | SUBLINGUAL | Status: AC | PRN

## 2015-03-15 NOTE — Telephone Encounter (Signed)
New Message      Pt's wife calling stating that pt was seen today and the nurse was going to send in refills for pt's meds. Pt's wife states she thought the pt had a bottle of Nitroglycerin at home but he does not so they will need that sent in to their pharmacy as well. Please call back and advise.

## 2015-03-15 NOTE — Patient Instructions (Signed)
Your physician has requested that you have a cardiac catheterization. Cardiac catheterization is used to diagnose and/or treat various heart conditions. Doctors may recommend this procedure for a number of different reasons. The most common reason is to evaluate chest pain. Chest pain can be a symptom of coronary artery disease (CAD), and cardiac catheterization can show whether plaque is narrowing or blocking your heart's arteries. This procedure is also used to evaluate the valves, as well as measure the blood flow and oxygen levels in different parts of your heart. For further information please visit [email or web address]. Please follow instruction sheet, as given.   Your physician recommends that you return for lab work in:    BMET  CBC  INR  TODAY   Your physician recommends that you continue on your current medications as directed. Please refer to the Current Medication list given to you today.

## 2015-03-15 NOTE — Telephone Encounter (Signed)
REFILL SENT  TO  [facility]  AT PT'S WIFE  REQUEST .Adonis Housekeeper

## 2015-03-16 ENCOUNTER — Encounter (HOSPITAL_COMMUNITY)
Admission: RE | Disposition: A | Discharge status: Home / Self Care | Payer: Commercial Managed Care - HMO | Source: Ambulatory Visit | Attending: Cardiology

## 2015-03-16 ENCOUNTER — Ambulatory Visit (HOSPITAL_COMMUNITY)
Admission: RE | Admit: 2015-03-16 | Discharge: 2015-03-16 | Disposition: A | Discharge status: Home / Self Care | Payer: Commercial Managed Care - HMO | Source: Ambulatory Visit | Attending: Cardiology | Admitting: Cardiology

## 2015-03-16 DIAGNOSIS — I25118 Atherosclerotic heart disease of native coronary artery with other forms of angina pectoris: Secondary | ICD-10-CM | POA: Diagnosis not present

## 2015-03-16 DIAGNOSIS — I25718 Atherosclerosis of autologous vein coronary artery bypass graft(s) with other forms of angina pectoris: Secondary | ICD-10-CM | POA: Diagnosis not present

## 2015-03-16 DIAGNOSIS — Z955 Presence of coronary angioplasty implant and graft: Secondary | ICD-10-CM | POA: Insufficient documentation

## 2015-03-16 DIAGNOSIS — Z7982 Long term (current) use of aspirin: Secondary | ICD-10-CM | POA: Diagnosis not present

## 2015-03-16 DIAGNOSIS — I25719 Atherosclerosis of autologous vein coronary artery bypass graft(s) with unspecified angina pectoris: Secondary | ICD-10-CM | POA: Insufficient documentation

## 2015-03-16 DIAGNOSIS — I2582 Chronic total occlusion of coronary artery: Secondary | ICD-10-CM | POA: Insufficient documentation

## 2015-03-16 DIAGNOSIS — I25119 Atherosclerotic heart disease of native coronary artery with unspecified angina pectoris: Secondary | ICD-10-CM | POA: Insufficient documentation

## 2015-03-16 HISTORY — PX: CARDIAC CATHETERIZATION: SHX172

## 2015-03-16 SURGERY — LEFT HEART CATH AND CORS/GRAFTS ANGIOGRAPHY
Anesthesia: LOCAL

## 2015-03-16 MED ORDER — LIDOCAINE HCL (PF) 1 % IJ SOLN
INTRAMUSCULAR | Status: DC | PRN
  Administered 2015-03-16: 5 mL via SUBCUTANEOUS

## 2015-03-16 MED ORDER — SODIUM CHLORIDE 0.9 % WEIGHT BASED INFUSION: 1.0000 mL/kg/h | INTRAVENOUS | Status: DC

## 2015-03-16 MED ORDER — SODIUM CHLORIDE 0.9 % WEIGHT BASED INFUSION
3.0000 mL/kg/h | INTRAVENOUS | Status: DC
  Administered 2015-03-16: 3 mL/kg/h via INTRAVENOUS

## 2015-03-16 MED ORDER — DIAZEPAM 2 MG PO TABS: 2.0000 mg | ORAL_TABLET | ORAL | Status: DC | PRN

## 2015-03-16 MED ORDER — HEPARIN SODIUM (PORCINE) 1000 UNIT/ML IJ SOLN
INTRAMUSCULAR | Status: DC | PRN
  Administered 2015-03-16: 4000 [IU] via INTRAVENOUS

## 2015-03-16 MED ORDER — SODIUM CHLORIDE 0.9 % IJ SOLN: 3.0000 mL | INTRAMUSCULAR | Status: DC | PRN

## 2015-03-16 MED ORDER — SODIUM CHLORIDE 0.9 % IV SOLN: 250.0000 mL | INTRAVENOUS | Status: DC | PRN

## 2015-03-16 MED ORDER — SODIUM CHLORIDE 0.9 % IJ SOLN
3.0000 mL | Freq: Two times a day (BID) | INTRAMUSCULAR | Status: DC
Start: 2015-03-16 — End: 2015-03-16

## 2015-03-16 MED ORDER — MIDAZOLAM HCL 2 MG/2ML IJ SOLN
INTRAMUSCULAR | Status: DC | PRN
  Administered 2015-03-16: 2 mg via INTRAVENOUS

## 2015-03-16 MED ORDER — FENTANYL CITRATE (PF) 100 MCG/2ML IJ SOLN
INTRAMUSCULAR | Status: AC
  Filled 2015-03-16: qty 2

## 2015-03-16 MED ORDER — ASPIRIN 81 MG PO CHEW
CHEWABLE_TABLET | ORAL | Status: AC
  Filled 2015-03-16: qty 1

## 2015-03-16 MED ORDER — HEPARIN SODIUM (PORCINE) 1000 UNIT/ML IJ SOLN
INTRAMUSCULAR | Status: AC
  Filled 2015-03-16: qty 1

## 2015-03-16 MED ORDER — ASPIRIN 81 MG PO CHEW
81.0000 mg | CHEWABLE_TABLET | ORAL | Status: AC
  Administered 2015-03-16: 81 mg via ORAL

## 2015-03-16 MED ORDER — MIDAZOLAM HCL 2 MG/2ML IJ SOLN
INTRAMUSCULAR | Status: AC
  Filled 2015-03-16: qty 2

## 2015-03-16 MED ORDER — SODIUM CHLORIDE 0.9 % WEIGHT BASED INFUSION: 3.0000 mL/kg/h | INTRAVENOUS | Status: DC

## 2015-03-16 MED ORDER — LIDOCAINE HCL (PF) 1 % IJ SOLN
INTRAMUSCULAR | Status: AC
  Filled 2015-03-16: qty 30

## 2015-03-16 MED ORDER — IOHEXOL 350 MG/ML SOLN
INTRAVENOUS | Status: DC | PRN
  Administered 2015-03-16: 80 mL via INTRACARDIAC

## 2015-03-16 MED ORDER — SODIUM CHLORIDE 0.9 % IJ SOLN: 3.0000 mL | Freq: Two times a day (BID) | INTRAMUSCULAR | Status: DC

## 2015-03-16 MED ORDER — VERAPAMIL HCL 2.5 MG/ML IV SOLN
INTRAVENOUS | Status: AC
  Filled 2015-03-16: qty 2

## 2015-03-16 MED ORDER — HEPARIN (PORCINE) IN NACL 2-0.9 UNIT/ML-% IJ SOLN
INTRAMUSCULAR | Status: AC
  Filled 2015-03-16: qty 1000

## 2015-03-16 MED ORDER — VERAPAMIL HCL 2.5 MG/ML IV SOLN
INTRAVENOUS | Status: DC | PRN
  Administered 2015-03-16: 13:00:00 via INTRA_ARTERIAL

## 2015-03-16 MED ORDER — FENTANYL CITRATE (PF) 100 MCG/2ML IJ SOLN
INTRAMUSCULAR | Status: DC | PRN
  Administered 2015-03-16: 25 ug via INTRAVENOUS

## 2015-03-16 MED ORDER — ASPIRIN 81 MG PO CHEW: 81.0000 mg | CHEWABLE_TABLET | Freq: Every day | ORAL | Status: DC

## 2015-03-16 SURGICAL SUPPLY — 14 items

## 2015-03-16 NOTE — Discharge Instructions (Signed)
Radial Site Care °Refer to this sheet in the next few weeks. These instructions provide you with information on caring for yourself after your procedure. Your caregiver may also give you more specific instructions. Your treatment has been planned according to current medical practices, but problems sometimes occur. Call your caregiver if you have any problems or questions after your procedure. °HOME CARE INSTRUCTIONS °· You may shower the day after the procedure. Remove the bandage (dressing) and gently wash the site with plain soap and water. Gently pat the site dry. °· Do not apply powder or lotion to the site. °· Do not submerge the affected site in water for 3 to 5 days. °· Inspect the site at least twice daily. °· Do not flex or bend the affected arm for 24 hours. °· No lifting over 5 pounds (2.3 kg) for 5 days after your procedure. °· Do not drive home if you are discharged the same day of the procedure. Have someone else drive you. °· You may drive 24 hours after the procedure unless otherwise instructed by your caregiver. °· Do not operate machinery or power tools for 24 hours. °· A responsible adult should be with you for the first 24 hours after you arrive home. °What to expect: °· Any bruising will usually fade within 1 to 2 weeks. °· Blood that collects in the tissue (hematoma) may be painful to the touch. It should usually decrease in size and tenderness within 1 to 2 weeks. °SEEK IMMEDIATE MEDICAL CARE IF: °· You have unusual pain at the radial site. °· You have redness, warmth, swelling, or pain at the radial site. °· You have drainage (other than a small amount of blood on the dressing). °· You have chills. °· You have a fever or persistent symptoms for more than 72 hours. °· You have a fever and your symptoms suddenly get worse. °· Your arm becomes pale, cool, tingly, or numb. °· You have heavy bleeding from the site. Hold pressure on the site. °Document Released: 10/19/2010 Document Revised:  12/09/2011 Document Reviewed: 10/19/2010 °[employer]® Patient Information ©2015 [employer]. This information is not intended to replace advice given to you by your health care provider. Make sure you discuss any questions you have with your health care provider. ° °

## 2015-03-16 NOTE — H&P (View-Only) (Signed)
Patient ID: Curtis Galloway, male   DOB: [birth year], 74 y.o.   MRN: [id number] 74 y.o. f/u CAD . Cath  2013 showed occluded grafts and had stent to LAD .No angina. Wallks daily. Intolerant to nitrates with headache   Compliant with meds   Cath revealed:  1. 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  2. Unstable angina secondary to severe hazy stenosis in proximal LAD  3. Successful PTCA/DES x 1 proximal LAD  4. Preserved LV systolic function with akinesis of the inferior wall at the base   Grafts:  SVG: to PDA occluded New since 2003  SVG: Diagonal occluded New since 2003  Free Rima: Came off diagonal graft atretic in 2003  [place]: Known to be occluded cath 2003   Effient stopped l 2/16  Needs pain management for back  Having more chest pain. Stubborn about taking nitro for  Pain at rest and with exertion last few weeks  Compliant with meds   ROS: Denies fever, malais, weight loss, blurry vision, decreased visual acuity, cough, sputum, SOB, hemoptysis, pleuritic pain, palpitaitons, heartburn, abdominal pain, melena, lower extremity edema, claudication, or rash.  All other systems reviewed and negative  General: Affect appropriate Healthy:  appears stated age 74: normal Neck supple with no adenopathy JVP normal no bruits no thyromegaly Lungs clear with no wheezing and good diaphragmatic motion Heart:  S1/S2 no murmur, no rub, gallop or click PMI normal Abdomen: benighn, BS positve, no tenderness, no AAA no bruit.  No HSM or HJR Distal pulses intact with no bruits No edema Neuro non-focal Skin warm and dry No muscular weakness   Current Outpatient Prescriptions  Medication Sig Dispense Refill  . aspirin 325 MG tablet Take 325 mg by mouth daily.    Marland Kitchen atorvastatin (LIPITOR) 80 MG tablet Take 1 tablet (80 mg total) by mouth at  bedtime. 90 tablet 3  . fish oil-omega-3 fatty acids 1000 MG capsule Take 2 g by mouth daily.     . metoprolol succinate (TOPROL-XL) 50 MG 24 hr tablet Take 1 tablet (50 mg total) by mouth daily. Take with or immediately following a meal. 90 tablet 3  . Multiple Vitamin (MULTIVITAMIN) tablet Take 1 tablet by mouth daily.      . nitroGLYCERIN (NITROSTAT) 0.4 MG SL tablet Place 0.4 mg under the tongue every 5 (five) minutes as needed for chest pain.     No current facility-administered medications for this visit.    Allergies  Codeine and Tramadol  Electrocardiogram:  2/15  SR rate 94 nonspecific ST/T wave changes   Assessment and Plan CAD:  Distant CABG with occluded grafts.  Occluded native RCA with stent in native LAD.  Will arrange cath with Dr Martinique tomorrow Continue beta blocker ASA/  Will not load with Effient as he may need repeat CABG.  Encouraged him to take nitro if needed or go to  ER for prlonge pain  Chol:  On statin

## 2015-03-16 NOTE — Interval H&P Note (Signed)
History and Physical Interval Note:  03/16/2015 12:52 PM  Curtis Galloway  has presented today for surgery, with the diagnosis of cp  The various methods of treatment have been discussed with the patient and family. After consideration of risks, benefits and other options for treatment, the patient has consented to  Procedure(s): Left Heart Cath and Cors/Grafts Angiography (N/A) as a surgical intervention .  The patient's history has been reviewed, patient examined, no change in status, stable for surgery.  I have reviewed the patient's chart and labs.  Questions were answered to the patient's satisfaction.     Jenkins Rouge

## 2015-03-17 ENCOUNTER — Encounter (HOSPITAL_COMMUNITY): Payer: Self-pay | Admitting: Cardiovascular Disease

## 2015-03-17 MED FILL — Heparin Sodium (Porcine) 2 Unit/ML in Sodium Chloride 0.9%: INTRAMUSCULAR | Qty: 1000 | Status: AC

## 2015-03-27 ENCOUNTER — Other Ambulatory Visit: Payer: Self-pay

## 2015-05-30 ENCOUNTER — Other Ambulatory Visit (INDEPENDENT_AMBULATORY_CARE_PROVIDER_SITE_OTHER): Payer: Commercial Managed Care - HMO

## 2015-05-30 ENCOUNTER — Ambulatory Visit (INDEPENDENT_AMBULATORY_CARE_PROVIDER_SITE_OTHER): Payer: Commercial Managed Care - HMO

## 2015-05-30 DIAGNOSIS — E119 Type 2 diabetes mellitus without complications: Secondary | ICD-10-CM | POA: Diagnosis not present

## 2015-05-30 DIAGNOSIS — Z23 Encounter for immunization: Secondary | ICD-10-CM

## 2015-05-30 LAB — MICROALBUMIN / CREATININE URINE RATIO
Creatinine,U: 177.8 mg/dL
Microalb Creat Ratio: 0.5 mg/g (ref 0.0–30.0)
Microalb, Ur: 0.9 mg/dL (ref 0.0–1.9)

## 2015-05-30 LAB — HEMOGLOBIN A1C: HEMOGLOBIN A1C: 6.2 % (ref 4.6–6.5)

## 2015-06-28 ENCOUNTER — Ambulatory Visit (INDEPENDENT_AMBULATORY_CARE_PROVIDER_SITE_OTHER): Payer: Commercial Managed Care - HMO | Admitting: Internal Medicine

## 2015-06-28 ENCOUNTER — Encounter: Payer: Self-pay | Admitting: Internal Medicine

## 2015-06-28 ENCOUNTER — Other Ambulatory Visit (INDEPENDENT_AMBULATORY_CARE_PROVIDER_SITE_OTHER): Payer: Commercial Managed Care - HMO

## 2015-06-28 VITALS — BP 160/92 | HR 92 | Temp 98.8°F | Resp 12 | Ht 69.5 in | Wt 172.8 lb

## 2015-06-28 DIAGNOSIS — R509 Fever, unspecified: Secondary | ICD-10-CM

## 2015-06-28 DIAGNOSIS — I10 Essential (primary) hypertension: Secondary | ICD-10-CM | POA: Diagnosis not present

## 2015-06-28 LAB — URINALYSIS, ROUTINE W REFLEX MICROSCOPIC
KETONES UR: 15 — AB
LEUKOCYTES UA: NEGATIVE
NITRITE: NEGATIVE
Specific Gravity, Urine: >=1.03 — AB (ref 1.000–1.030)
Total Protein, Urine: 30 — AB
UROBILINOGEN UA: 0.2 (ref 0.0–1.0)
Urine Glucose: NEGATIVE
pH: 6 (ref 5.0–8.0)

## 2015-06-28 LAB — CBC WITH DIFFERENTIAL/PLATELET
BASOS PCT: 0 % (ref 0.0–3.0)
Basophils Absolute: 0 10*3/uL (ref 0.0–0.1)
EOS PCT: 0.7 % (ref 0.0–5.0)
Eosinophils Absolute: 0.1 10*3/uL (ref 0.0–0.7)
HCT: 39.9 % (ref 39.0–52.0)
Hemoglobin: 13.5 g/dL (ref 13.0–17.0)
LYMPHS ABS: 1.2 10*3/uL (ref 0.7–4.0)
Lymphocytes Relative: 9.1 % — ABNORMAL LOW (ref 12.0–46.0)
MCHC: 33.8 g/dL (ref 30.0–36.0)
MCV: 91.1 fl (ref 78.0–100.0)
MONO ABS: 1.4 10*3/uL — AB (ref 0.1–1.0)
Monocytes Relative: 10.8 % (ref 3.0–12.0)
Neutro Abs: 10.5 10*3/uL — ABNORMAL HIGH (ref 1.4–7.7)
Neutrophils Relative %: 79.4 % — ABNORMAL HIGH (ref 43.0–77.0)
PLATELETS: 257 10*3/uL (ref 150.0–400.0)
RBC: 4.38 Mil/uL (ref 4.22–5.81)
RDW: 13.7 % (ref 11.5–15.5)
WBC: 13.2 10*3/uL — ABNORMAL HIGH (ref 4.0–10.5)

## 2015-06-28 LAB — HEPATIC FUNCTION PANEL
ALT: 16 U/L (ref 0–53)
AST: 16 U/L (ref 0–37)
Albumin: 3.7 g/dL (ref 3.5–5.2)
Alkaline Phosphatase: 79 U/L (ref 39–117)
BILIRUBIN TOTAL: 0.5 mg/dL (ref 0.2–1.2)
Bilirubin, Direct: 0.1 mg/dL (ref 0.0–0.3)
Total Protein: 7.5 g/dL (ref 6.0–8.3)

## 2015-06-28 LAB — BASIC METABOLIC PANEL
BUN: 13 mg/dL (ref 6–23)
CHLORIDE: 102 meq/L (ref 96–112)
CO2: 28 mEq/L (ref 19–32)
CREATININE: 1.1 mg/dL (ref 0.40–1.50)
Calcium: 9.2 mg/dL (ref 8.4–10.5)
GFR: 69.81 mL/min (ref >60.00)
Glucose, Bld: 109 mg/dL — ABNORMAL HIGH (ref 70–99)
Potassium: 3.5 mEq/L (ref 3.5–5.1)
Sodium: 140 mEq/L (ref 135–145)

## 2015-06-28 MED ORDER — HYDROCHLOROTHIAZIDE 12.5 MG PO CAPS: 12.5000 mg | ORAL_CAPSULE | Freq: Every day | ORAL | Status: DC

## 2015-06-28 NOTE — Progress Notes (Signed)
Pre visit review using our clinic review tool, if applicable. No additional management support is needed unless otherwise documented below in the visit note. 

## 2015-06-28 NOTE — Patient Instructions (Addendum)
NSAIDS ( Aleve, Advil, Naproxen) or Tylenol every 4 hrs as needed for fever as discussed based on label recommendations. Please keep a temperature diary. Monitor your temperature if you have chills or sweats. This may help define the cause of your symptoms.Check for ticks when you bathe.    Minimal Blood Pressure Goal= AVERAGE < 140/90;  Ideal is an AVERAGE < 135/85. This AVERAGE should be calculated from @ least 5-7 BP readings taken @ different times of day on different days of week. You should not respond to isolated BP readings , but rather the AVERAGE for that week .Please bring your  blood pressure cuff to office visits to verify that it is reliable.It  can also be checked against the blood pressure device at the pharmacy. Finger or wrist cuffs are not dependable; an arm cuff is.  Fill the  prescription for the BP medication if BP NOT @ goal based on  7 to 14 day average.

## 2015-06-28 NOTE — Progress Notes (Signed)
   Subjective:    Patient ID: Curtis Galloway, male    DOB: [birth year], 74 y.o.   MRN: [id number]  HPI  He began having fever and chills 06/25/15 without any specific trigger or exposures. As of 9/26 he had a dull headache over the crown described as pressure up to level VIII. It does resolve with to Tylenol.  He states he does restrict salt. 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  Review of Systems  Frontal headache, facial pain , nasal purulence, dental pain, sore throat , otic pain or otic discharge denied.  Cough, sputum production, hemoptysis, or pleuritic pain denied.  No diarrhea present.Cola colored urine or clay colored stools denied.  Dysuria, pyuria, or hematuria not present.  No new rashes, pustules, vesicles.  No redness or swelling of joints.  No significant travel, pets, or tick exposures.      Objective:   Physical Exam Pertinent or positive findings include: Pattern alopecia is present. Dorsalis pedis pulses are decreased. 0+ left knee reflex. No clinical evidence of meningismus.  General appearance :adequately nourished; in no distress.  Eyes: No conjunctival inflammation or scleral icterus is present.  Oral exam:  Lips and gums are healthy appearing.There is no oropharyngeal erythema or exudate noted. Dental hygiene is good.  Heart:  Normal rate and regular rhythm. S1 and S2 normal without gallop, murmur, click, rub or other extra sounds    Lungs:Chest clear to auscultation; no wheezes, rhonchi,rales ,or rubs present.No increased work of breathing.   Abdomen: bowel sounds normal, soft and non-tender without masses, organomegaly or hernias noted.  No guarding or rebound. No flank tenderness to percussion.  Vascular : all pulses equal ; no bruits present.  Skin:Warm & dry.  Intact without suspicious lesions or rashes ; no tenting or jaundice   Lymphatic: No lymphadenopathy is noted about the head, neck, axilla.    Neuro: Strength, tone  normal.     Assessment & Plan:  #1 fever without localizing signs or symptoms  #2 hypertension, questionable control  See orders

## 2015-06-30 ENCOUNTER — Other Ambulatory Visit (INDEPENDENT_AMBULATORY_CARE_PROVIDER_SITE_OTHER): Payer: Commercial Managed Care - HMO

## 2015-06-30 DIAGNOSIS — R739 Hyperglycemia, unspecified: Secondary | ICD-10-CM | POA: Diagnosis not present

## 2015-06-30 LAB — HEMOGLOBIN A1C: Hgb A1c MFr Bld: 6.2 % (ref 4.6–6.5)

## 2015-07-10 ENCOUNTER — Telehealth: Payer: Self-pay | Admitting: Internal Medicine

## 2015-07-10 ENCOUNTER — Ambulatory Visit (INDEPENDENT_AMBULATORY_CARE_PROVIDER_SITE_OTHER)
Admission: RE | Admit: 2015-07-10 | Discharge: 2015-07-10 | Disposition: A | Discharge status: Home / Self Care | Payer: Commercial Managed Care - HMO | Source: Ambulatory Visit | Attending: Internal Medicine | Admitting: Internal Medicine

## 2015-07-10 ENCOUNTER — Ambulatory Visit (INDEPENDENT_AMBULATORY_CARE_PROVIDER_SITE_OTHER): Payer: Commercial Managed Care - HMO | Admitting: Internal Medicine

## 2015-07-10 ENCOUNTER — Encounter: Payer: Self-pay | Admitting: Internal Medicine

## 2015-07-10 VITALS — BP 138/82 | HR 116 | Temp 99.6°F | Resp 18 | Wt 167.0 lb

## 2015-07-10 DIAGNOSIS — R059 Cough, unspecified: Secondary | ICD-10-CM

## 2015-07-10 DIAGNOSIS — J189 Pneumonia, unspecified organism: Secondary | ICD-10-CM

## 2015-07-10 DIAGNOSIS — R05 Cough: Secondary | ICD-10-CM | POA: Diagnosis not present

## 2015-07-10 DIAGNOSIS — R509 Fever, unspecified: Secondary | ICD-10-CM

## 2015-07-10 MED ORDER — BENZONATATE 200 MG PO CAPS: 200.0000 mg | ORAL_CAPSULE | Freq: Three times a day (TID) | ORAL | Status: DC | PRN

## 2015-07-10 MED ORDER — PROMETHAZINE-DM 6.25-15 MG/5ML PO SYRP: 5.0000 mL | ORAL_SOLUTION | Freq: Four times a day (QID) | ORAL | Status: DC | PRN

## 2015-07-10 MED ORDER — LEVOFLOXACIN 500 MG PO TABS: 500.0000 mg | ORAL_TABLET | Freq: Every day | ORAL | Status: DC

## 2015-07-10 NOTE — Progress Notes (Signed)
Pre visit review using our clinic review tool, if applicable. No additional management support is needed unless otherwise documented below in the visit note. 

## 2015-07-10 NOTE — Telephone Encounter (Signed)
Spoke with spouse to inform of results.

## 2015-07-10 NOTE — Telephone Encounter (Signed)
Let him know the cxr shows pneumonia.  The antibiotic I prescribed should treat this, but if he feels worse or is not feeling better in the next 2 days he needs to call.  He needs to have a follow up cxr in 6-8 weeks to make sure the pneumonia has resolved. (ordered for week of 11/28).

## 2015-07-10 NOTE — Progress Notes (Signed)
Subjective:    Patient ID: Curtis Galloway, male    DOB: December 29, 1940, 74 y.o.   MRN: [id number]  HPI  He is here today because he continues to have fever, cough and is not feeling better.  His symptoms started the end of September with chills, fever (tmax 101.4) and fatigue.  He was here on the 28th and blood work revealed an elevated WBC, but was otherwise normal.  A urinalysis was normal.  His bp was elevated at that time and he was advised to monitor it.  It remained elevated and he started another medication as instructed.    He has had a cough for the past week and it is wet sounding.  The cough is worse at night and he does not sleep well. He had a scratchy throat this morning.  He also has frontal sinus pain.  He has decreased appetite and has lost a few pounds since he was here last.    Medications and allergies reviewed with patient and updated if appropriate.  Patient Active Problem List   Diagnosis Date Noted  . Bilateral lumbar radiculopathy 07/08/2014  . Diabetes mellitus with peripheral vascular disease ([place]) 12/06/2013  . Abnormal nuclear stress test, 11/10/13 11/16/2013  . Adenomatous polyp 04/06/2013  . Unstable angina ([place]) 11/02/2011  . HYPERNEPHROMA 10/11/2009  . Elevated lipids 10/11/2009  . Essential hypertension, benign 10/11/2009  . Coronary atherosclerosis of native coronary artery, 11/15/13 PTCA/DES prox LAD, Promus 10/11/2009  . GERD 10/11/2009    Past Medical History  Diagnosis Date  . GERD (gastroesophageal reflux disease)   . Hyperlipidemia   . Hypertension   . Coronary artery disease     Dr. Johnsie Cancel,  stent to LAD  . Hypernephroma [facility]) 2004    Dr Rosana Hoes  . Abnormal nuclear stress test 11/16/2013    Past Surgical History  Procedure Laterality Date  . Appendectomy    . Coronary artery bypass graft  2000    Dr. Roxan Hockey 2001  . Lumbar laminectomy      x 4  . 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 Cervical laminectomy      x 1  .  Colonscopy  2006    Dr. Delfin Edis; negative  . Knee surgery  3-4 surgeries    bone moved from hip to under knee cap  . Cardiac catheterization  11/01/2011    Dr Johnsie Cancel  . Coronary angioplasty with stent placement  1990's; 11/15/2013    "today's make a total of 3" (11/15/2013)  . Tonsillectomy    . Lumbar fusion  06/08/2013    [place]  . Coronary angioplasty with stent placement  11/15/13    Promus to prox. LAD  . Colonoscopy with polypectomy  2014    Dr Paulita Fujita  . Left heart catheterization with coronary/graft angiogram  11/01/2011    Procedure: LEFT HEART CATHETERIZATION WITH Beatrix Fetters;  Surgeon: Josue Hector, MD;  Location: [facility] CATH LAB;  Service: Cardiovascular;;  . Left heart catheterization with coronary/graft angiogram N/A 11/15/2013    Procedure: LEFT HEART CATHETERIZATION WITH Beatrix Fetters;  Surgeon: Burnell Blanks, MD;  Location: [facility] CATH LAB;  Service: Cardiovascular;  Laterality: N/A;  . Cardiac catheterization N/A 03/16/2015    Procedure: Left Heart Cath and Cors/Grafts Angiography;  Surgeon: Josue Hector, MD;  Location: [facility] CV LAB;  Service: Cardiovascular;  Laterality: N/A;    Social History   Social History  . Marital Status: Married  Spouse Name: N/A  . Number of Children: N/A  . Years of Education: N/A   Social History Main Topics  . Smoking status: Former Smoker -- 1.00 packs/day for 38 years    Types: Cigarettes    Quit date: 10/01/1996  . Smokeless tobacco: Never Used     Comment: smoked 1960-1998, up 1 ppd  . Alcohol Use: No  . Drug Use: No  . Sexual Activity: Yes   Other Topics Concern  . None   Social History Narrative   Retired   Married   Former smoker   Alcohol use-no   Regular exercise-no    Review of Systems  Constitutional: Positive for appetite change and unexpected weight change. Negative for fever and fatigue.  HENT: Positive for sinus pressure and sore throat. Negative for congestion and ear  pain.   Respiratory: Positive for cough, shortness of breath and wheezing.   Cardiovascular: Negative for chest pain, palpitations and leg swelling.  Gastrointestinal: Negative for nausea, abdominal pain and diarrhea.  Genitourinary: Negative for dysuria.  Allergic/Immunologic: Negative for immunocompromised state.  Neurological: Positive for headaches. Negative for dizziness and light-headedness.       Objective:   Filed Vitals:   07/10/15 1428  BP: 138/82  Pulse: 116  Temp: 99.6 F (37.6 C)  Resp: 18   Filed Weights   07/10/15 1428  Weight: 167 lb (75.751 kg)   Body mass index is 24.32 kg/(m^2).   Physical Exam  Constitutional: He is oriented to person, place, and time. He appears well-developed and well-nourished. He appears distressed (appears fatigued and acutely sick).  HENT:  Head: Normocephalic and atraumatic.  Right Ear: External ear normal.  Left Ear: External ear normal.  Mouth/Throat: Oropharynx is clear and moist. No oropharyngeal exudate.  Eyes: Conjunctivae are normal.  Neck: Neck supple. No tracheal deviation present. No thyromegaly present.  Cardiovascular: Normal rate, regular rhythm and normal heart sounds.   Pulmonary/Chest: Effort normal. No respiratory distress. He has no wheezes.  Breath sounds diffusely decreased - sounds chronic, no rales or wheeze  Neurological: He is alert and oriented to person, place, and time.  Skin: Skin is warm and dry. He is not diaphoretic.        Assessment & Plan:   Cough, fever, fatigue / probable pneumonia He appears ill today Stat cxr today Start tylenol every 6 hours levaquin 500 mg daily for 10 days Tessalon perles, cough syrup prn  If no improvement or any worsening in the next 48 hours he will need to go to the ED

## 2015-07-10 NOTE — Patient Instructions (Signed)
Your antibiotic, cough syrup and cough pills were sent to your pharmacy.   Start taking tylenol every 6 hours.  A chest xray was ordered.  We will call you with the results   If you are not feeling any better within 48 hours please call.

## 2015-07-12 ENCOUNTER — Telehealth: Payer: Self-pay | Admitting: *Deleted

## 2015-07-12 NOTE — Telephone Encounter (Signed)
Receive call wife states md told them to call back if pt wasn't doing any better. Wife states he is not any better, still have cough spitting up yellowish phlegm. He is now complaining of having pain on (R) side. Denies fever or SOB sxs. He has taking 4 dosage of antibiotic that was rx...Curtis Galloway

## 2015-07-12 NOTE — Telephone Encounter (Signed)
Notified pt wife with md response.../lmb 

## 2015-07-12 NOTE — Telephone Encounter (Signed)
The pain is likely from the pneumonia - it is on the right side and can cause pain. He should be taking advil or tylenol for the pain.  I think he sounds little better since he is not having a fever anymore and is no longer short of breath.  The cough will improve slowly, but most likely he will continue to cough even after the antibiotics have been completed.  I expect the phlegm to slowly start to turn a more clear color and to become less frequent, but this will take a while.  He energy level will likely remain lower than normal for weeks, but he should see improvement.

## 2015-08-28 ENCOUNTER — Ambulatory Visit (INDEPENDENT_AMBULATORY_CARE_PROVIDER_SITE_OTHER)
Admission: RE | Admit: 2015-08-28 | Discharge: 2015-08-28 | Disposition: A | Discharge status: Home / Self Care | Payer: Commercial Managed Care - HMO | Source: Ambulatory Visit | Attending: Internal Medicine | Admitting: Internal Medicine

## 2015-08-28 DIAGNOSIS — J189 Pneumonia, unspecified organism: Secondary | ICD-10-CM

## 2015-08-30 ENCOUNTER — Telehealth: Payer: Self-pay

## 2015-08-30 NOTE — Telephone Encounter (Signed)
Patient called to educate on Medicare Wellness apt. LVM for the patient to call back to educate and schedule for wellness visit. Left direct phone number

## 2015-09-06 NOTE — Progress Notes (Signed)
Patient ID: Curtis Galloway, male   DOB: 04-24-1941, 74 y.o.   MRN: [id number] 74 y.o. f/u CAD . Cath  2013 showed occluded grafts and had stent to LAD .No angina. Wallks daily. Intolerant to nitrates with headache   Compliant with meds   Cath revealed:  1. 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  2. Unstable angina secondary to severe hazy stenosis in proximal LAD  3. Successful PTCA/DES x 1 proximal LAD  4. Preserved LV systolic function with akinesis of the inferior wall at the base   Grafts:  SVG: to PDA occluded New since 2003  SVG: Diagonal occluded New since 2003  [place]: Came off diagonal graft atretic in 2003  [place]: Known to be occluded cath 2003   Effient stopped l 2/16  Needs pain management for back  Having more chest pain. Stubborn about taking nitro for  Pain at rest and with exertion last few weeks  Compliant with meds  Cath June 2016 for recurrent symptoms no critical disease   Ost RCA lesion, 100% stenosed.  Ost LM to LM lesion, 30% stenosed.  1st Mrg lesion, 40% stenosed.  Mid Cx to Dist Cx lesion, 50% stenosed.  1st Diag lesion, 80% stenosed.  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 Patent stent in proximal LAD with good left to right collaterals Moderate disease in circumflex /OM2 Unchanged from cath 2015 with widely patent proximal LAD stent EF 55% inferobasal hypokinesis Continue Medical Rx  Recent pneumonia  HCTZ added for BP at this time    ROS: Denies fever, malais, weight loss, blurry vision, decreased visual acuity, cough, sputum, SOB, hemoptysis, pleuritic pain, palpitaitons, heartburn, abdominal pain, melena, lower extremity edema, claudication, or rash.  All other systems reviewed and negative  General: Affect  appropriate Healthy:  appears stated age 74: normal Neck supple with no adenopathy JVP normal no bruits no thyromegaly Lungs clear with no wheezing and good diaphragmatic motion Heart:  S1/S2 no murmur, no rub, gallop or click PMI normal Abdomen: benighn, BS positve, no tenderness, no AAA no bruit.  No HSM or HJR Distal pulses intact with no bruits No edema Neuro non-focal Skin warm and dry No muscular weakness   Current Outpatient Prescriptions  Medication Sig Dispense Refill  . aspirin 325 MG tablet Take 325 mg by mouth daily.    Marland Kitchen atorvastatin (LIPITOR) 80 MG tablet Take 1 tablet (80 mg total) by mouth at bedtime. 90 tablet 3  . fish oil-omega-3 fatty acids 1000 MG capsule Take 2 g by mouth daily.     . hydrochlorothiazide (MICROZIDE) 12.5 MG capsule Take 1 capsule (12.5 mg total) by mouth daily. 30 capsule 1  . metoprolol succinate (TOPROL-XL) 50 MG 24 hr tablet Take 1 tablet (50 mg total) by mouth daily. Take with or immediately following a meal. 90 tablet 3  . Multiple Vitamin (MULTIVITAMIN) tablet Take 1 tablet by mouth daily.      . nitroGLYCERIN (NITROSTAT) 0.4 MG SL tablet Place 1 tablet (0.4 mg total) under the tongue every 5 (five) minutes as needed for chest pain. 25 tablet 4   No current facility-administered medications for this visit.    Allergies  Codeine and Tramadol  Electrocardiogram:  2/15  SR rate 94 nonspecific ST/T wave changes   Assessment and Plan CAD:  Distant CABG with occluded grafts.  Occluded native RCA with stent in native LAD.  Cath in June 2016 stable.  Medical Rx  Continue beta blocker ASA  Chol:  On statin   HTN:  Will stop HCTZ unless BP high  Pneumonia:  Has had vaccine  F/u CXR clear improved normal exam  Jenkins Rouge

## 2015-09-18 ENCOUNTER — Ambulatory Visit (INDEPENDENT_AMBULATORY_CARE_PROVIDER_SITE_OTHER): Payer: Commercial Managed Care - HMO | Admitting: Cardiovascular Disease

## 2015-09-18 ENCOUNTER — Encounter: Payer: Self-pay | Admitting: Cardiovascular Disease

## 2015-09-18 VITALS — BP 118/72 | HR 89 | Ht 69.0 in | Wt 170.0 lb

## 2015-09-18 DIAGNOSIS — I2510 Atherosclerotic heart disease of native coronary artery without angina pectoris: Secondary | ICD-10-CM | POA: Diagnosis not present

## 2015-09-18 DIAGNOSIS — I10 Essential (primary) hypertension: Secondary | ICD-10-CM | POA: Diagnosis not present

## 2015-09-18 DIAGNOSIS — I2583 Coronary atherosclerosis due to lipid rich plaque: Principal | ICD-10-CM

## 2015-09-18 MED ORDER — HYDROCHLOROTHIAZIDE 12.5 MG PO CAPS: 12.5000 mg | ORAL_CAPSULE | Freq: Every day | ORAL | Status: DC

## 2015-09-18 NOTE — Patient Instructions (Signed)

## 2015-11-28 ENCOUNTER — Other Ambulatory Visit: Payer: Self-pay | Admitting: *Deleted

## 2015-11-28 ENCOUNTER — Telehealth: Payer: Self-pay | Admitting: Cardiovascular Disease

## 2015-11-28 DIAGNOSIS — M961 Postlaminectomy syndrome, not elsewhere classified: Secondary | ICD-10-CM | POA: Diagnosis not present

## 2015-11-28 DIAGNOSIS — M5416 Radiculopathy, lumbar region: Secondary | ICD-10-CM | POA: Diagnosis not present

## 2015-11-28 DIAGNOSIS — I10 Essential (primary) hypertension: Secondary | ICD-10-CM

## 2015-11-28 MED ORDER — METOPROLOL SUCCINATE ER 50 MG PO TB24: 50.0000 mg | ORAL_TABLET | Freq: Every day | ORAL | Status: DC

## 2015-11-28 MED ORDER — HYDROCHLOROTHIAZIDE 12.5 MG PO CAPS: 12.5000 mg | ORAL_CAPSULE | Freq: Every day | ORAL | Status: DC

## 2015-11-28 MED ORDER — ATORVASTATIN CALCIUM 80 MG PO TABS: 80.0000 mg | ORAL_TABLET | Freq: Every day | ORAL | Status: DC

## 2015-11-28 NOTE — Telephone Encounter (Signed)
New Message  Pt wife sched appt w/ Dr Johnsie Cancel for June/2017- wanted to change all  medications for pt from [facility] mail delivery to [facility] pharmacy fax [phone] Phon [phone]

## 2015-12-22 ENCOUNTER — Telehealth: Payer: Self-pay | Admitting: Internal Medicine

## 2015-12-22 DIAGNOSIS — H25013 Cortical age-related cataract, bilateral: Secondary | ICD-10-CM | POA: Diagnosis not present

## 2015-12-22 DIAGNOSIS — H2513 Age-related nuclear cataract, bilateral: Secondary | ICD-10-CM | POA: Diagnosis not present

## 2015-12-22 DIAGNOSIS — H35041 Retinal micro-aneurysms, unspecified, right eye: Secondary | ICD-10-CM | POA: Diagnosis not present

## 2015-12-22 DIAGNOSIS — H35032 Hypertensive retinopathy, left eye: Secondary | ICD-10-CM | POA: Diagnosis not present

## 2015-12-22 DIAGNOSIS — H04123 Dry eye syndrome of bilateral lacrimal glands: Secondary | ICD-10-CM | POA: Diagnosis not present

## 2015-12-22 DIAGNOSIS — H35379 Puckering of macula, unspecified eye: Secondary | ICD-10-CM | POA: Diagnosis not present

## 2015-12-22 DIAGNOSIS — H35031 Hypertensive retinopathy, right eye: Secondary | ICD-10-CM | POA: Diagnosis not present

## 2015-12-22 LAB — HM DIABETES EYE EXAM

## 2015-12-22 NOTE — Telephone Encounter (Signed)
[facility] ophthalmology called requesting pts last office notes. i faxed them to [phone]

## 2015-12-26 DIAGNOSIS — M961 Postlaminectomy syndrome, not elsewhere classified: Secondary | ICD-10-CM | POA: Diagnosis not present

## 2015-12-26 DIAGNOSIS — M5416 Radiculopathy, lumbar region: Secondary | ICD-10-CM | POA: Diagnosis not present

## 2015-12-28 ENCOUNTER — Encounter: Payer: Self-pay | Admitting: Internal Medicine

## 2016-01-04 ENCOUNTER — Encounter (INDEPENDENT_AMBULATORY_CARE_PROVIDER_SITE_OTHER): Payer: PPO | Admitting: Ophthalmology

## 2016-01-04 DIAGNOSIS — I10 Essential (primary) hypertension: Secondary | ICD-10-CM

## 2016-01-04 DIAGNOSIS — H35033 Hypertensive retinopathy, bilateral: Secondary | ICD-10-CM

## 2016-01-04 DIAGNOSIS — H43813 Vitreous degeneration, bilateral: Secondary | ICD-10-CM

## 2016-01-04 DIAGNOSIS — H2513 Age-related nuclear cataract, bilateral: Secondary | ICD-10-CM

## 2016-01-11 ENCOUNTER — Other Ambulatory Visit (INDEPENDENT_AMBULATORY_CARE_PROVIDER_SITE_OTHER): Payer: PPO

## 2016-01-11 ENCOUNTER — Ambulatory Visit (INDEPENDENT_AMBULATORY_CARE_PROVIDER_SITE_OTHER): Payer: PPO | Admitting: Internal Medicine

## 2016-01-11 ENCOUNTER — Encounter: Payer: Self-pay | Admitting: Internal Medicine

## 2016-01-11 VITALS — BP 138/74 | HR 85 | Temp 97.9°F | Resp 16 | Wt 173.0 lb

## 2016-01-11 DIAGNOSIS — E1151 Type 2 diabetes mellitus with diabetic peripheral angiopathy without gangrene: Secondary | ICD-10-CM

## 2016-01-11 DIAGNOSIS — I2510 Atherosclerotic heart disease of native coronary artery without angina pectoris: Secondary | ICD-10-CM

## 2016-01-11 DIAGNOSIS — Z0000 Encounter for general adult medical examination without abnormal findings: Secondary | ICD-10-CM

## 2016-01-11 DIAGNOSIS — I10 Essential (primary) hypertension: Secondary | ICD-10-CM | POA: Diagnosis not present

## 2016-01-11 DIAGNOSIS — E785 Hyperlipidemia, unspecified: Secondary | ICD-10-CM | POA: Diagnosis not present

## 2016-01-11 LAB — COMPREHENSIVE METABOLIC PANEL
ALK PHOS: 72 U/L (ref 39–117)
ALT: 20 U/L (ref 0–53)
AST: 18 U/L (ref 0–37)
Albumin: 4 g/dL (ref 3.5–5.2)
BILIRUBIN TOTAL: 0.4 mg/dL (ref 0.2–1.2)
BUN: 25 mg/dL — ABNORMAL HIGH (ref 6–23)
CALCIUM: 9.4 mg/dL (ref 8.4–10.5)
CO2: 31 meq/L (ref 19–32)
Chloride: 103 mEq/L (ref 96–112)
Creatinine, Ser: 1.23 mg/dL (ref 0.40–1.50)
GFR: 61.27 mL/min (ref >60.00)
Glucose, Bld: 98 mg/dL (ref 70–99)
Potassium: 3.8 mEq/L (ref 3.5–5.1)
Sodium: 140 mEq/L (ref 135–145)
Total Protein: 7.2 g/dL (ref 6.0–8.3)

## 2016-01-11 LAB — CBC WITH DIFFERENTIAL/PLATELET
BASOS ABS: 0.1 10*3/uL (ref 0.0–0.1)
BASOS PCT: 0.5 % (ref 0.0–3.0)
EOS ABS: 0.2 10*3/uL (ref 0.0–0.7)
Eosinophils Relative: 1.4 % (ref 0.0–5.0)
HEMATOCRIT: 41.1 % (ref 39.0–52.0)
HEMOGLOBIN: 14 g/dL (ref 13.0–17.0)
LYMPHS PCT: 18.2 % (ref 12.0–46.0)
Lymphs Abs: 2.1 10*3/uL (ref 0.7–4.0)
MCHC: 34 g/dL (ref 30.0–36.0)
MCV: 90.3 fl (ref 78.0–100.0)
Monocytes Absolute: 1.1 10*3/uL — ABNORMAL HIGH (ref 0.1–1.0)
Monocytes Relative: 9.4 % (ref 3.0–12.0)
Neutro Abs: 8.3 10*3/uL — ABNORMAL HIGH (ref 1.4–7.7)
Neutrophils Relative %: 70.5 % (ref 43.0–77.0)
Platelets: 274 10*3/uL (ref 150.0–400.0)
RBC: 4.55 Mil/uL (ref 4.22–5.81)
RDW: 15 % (ref 11.5–15.5)
WBC: 11.8 10*3/uL — AB (ref 4.0–10.5)

## 2016-01-11 LAB — LIPID PANEL
CHOLESTEROL: 157 mg/dL (ref 0–200)
HDL: 42.3 mg/dL (ref >39.00)
LDL CALC: 92 mg/dL (ref 0–99)
NonHDL: 114.43
TRIGLYCERIDES: 114 mg/dL (ref 0.0–149.0)
Total CHOL/HDL Ratio: 4
VLDL: 22.8 mg/dL (ref 0.0–40.0)

## 2016-01-11 LAB — MICROALBUMIN / CREATININE URINE RATIO
Creatinine,U: 112.9 mg/dL
Microalb Creat Ratio: 0.6 mg/g (ref 0.0–30.0)

## 2016-01-11 LAB — TSH: TSH: 1.79 u[IU]/mL (ref 0.35–4.50)

## 2016-01-11 LAB — HEMOGLOBIN A1C: Hgb A1c MFr Bld: 6.4 % (ref 4.6–6.5)

## 2016-01-11 NOTE — Assessment & Plan Note (Signed)
Asymptomatic continue current meds

## 2016-01-11 NOTE — Assessment & Plan Note (Signed)
Check lipids On lipitor 80 mg daily Increase exercise

## 2016-01-11 NOTE — Assessment & Plan Note (Signed)
BP well controlled Current regimen effective and well tolerated Continue current medications at current doses  

## 2016-01-11 NOTE — Progress Notes (Signed)
Subjective:    Patient ID: Curtis Galloway, male    DOB: 1941/09/22, 75 y.o.   MRN: [id number]  HPI He is here for a physical exam.   Hypertension: He is taking his medication daily. He is compliant with a low sodium diet.  He denies chest pain, palpitations, edema, shortness of breath and regular headaches. He is exercising regularly.  He does not monitor his blood pressure at home.    Diabetes: He is controlling his sugars with lifestyle. He is not compliant with a diabetic diet. He is not exercising regularly.   He has no concerns.   Medications and allergies reviewed with patient and updated if appropriate.  Patient Active Problem List   Diagnosis Date Noted  . Bilateral lumbar radiculopathy 07/08/2014  . Diabetes mellitus with peripheral vascular disease ([place]) 12/06/2013  . Abnormal nuclear stress test, 11/10/13 11/16/2013  . Adenomatous polyp 04/06/2013  . Unstable angina ([place]) 11/02/2011  . HYPERNEPHROMA 10/11/2009  . Elevated lipids 10/11/2009  . Essential hypertension, benign 10/11/2009  . Coronary atherosclerosis of native coronary artery, 11/15/13 PTCA/DES prox LAD, Promus 10/11/2009  . GERD 10/11/2009    Current Outpatient Prescriptions on File Prior to Visit  Medication Sig Dispense Refill  . aspirin 325 MG tablet Take 325 mg by mouth daily.    Marland Kitchen atorvastatin (LIPITOR) 80 MG tablet Take 1 tablet (80 mg total) by mouth at bedtime. 90 tablet 2  . fish oil-omega-3 fatty acids 1000 MG capsule Take 2 g by mouth daily.     . hydrochlorothiazide (MICROZIDE) 12.5 MG capsule Take 1 capsule (12.5 mg total) by mouth daily. 90 capsule 2  . metoprolol succinate (TOPROL-XL) 50 MG 24 hr tablet Take 1 tablet (50 mg total) by mouth daily. Take with or immediately following a meal. 90 tablet 2  . Multiple Vitamin (MULTIVITAMIN) tablet Take 1 tablet by mouth daily.      . nitroGLYCERIN (NITROSTAT) 0.4 MG SL tablet Place 1 tablet (0.4 mg total) under the tongue every 5 (five) minutes  as needed for chest pain. 25 tablet 4   No current facility-administered medications on file prior to visit.    Past Medical History  Diagnosis Date  . GERD (gastroesophageal reflux disease)   . Hyperlipidemia   . Hypertension   . Coronary artery disease     Dr. Johnsie Cancel,  stent to LAD  . Hypernephroma [facility]) 2004    Dr Rosana Hoes  . Abnormal nuclear stress test 11/16/2013    Past Surgical History  Procedure Laterality Date  . Appendectomy    . Coronary artery bypass graft  2000    Dr. Roxan Hockey 2001  . Lumbar laminectomy      x 4  . 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 Cervical laminectomy      x 1  . Colonscopy  2006    Dr. Delfin Edis; negative  . Knee surgery  3-4 surgeries    bone moved from hip to under knee cap  . Cardiac catheterization  11/01/2011    Dr Johnsie Cancel  . Coronary angioplasty with stent placement  1990's; 11/15/2013    "today's make a total of 3" (11/15/2013)  . Tonsillectomy    . Lumbar fusion  06/08/2013    [place]  . Coronary angioplasty with stent placement  11/15/13    Promus to prox. LAD  . Colonoscopy with polypectomy  2014    Dr Paulita Fujita  . Left heart catheterization with coronary/graft angiogram  11/01/2011    Procedure: LEFT HEART CATHETERIZATION WITH Beatrix Fetters;  Surgeon: Josue Hector, MD;  Location: [facility] CATH LAB;  Service: Cardiovascular;;  . Left heart catheterization with coronary/graft angiogram N/A 11/15/2013    Procedure: LEFT HEART CATHETERIZATION WITH Beatrix Fetters;  Surgeon: Burnell Blanks, MD;  Location: [facility] CATH LAB;  Service: Cardiovascular;  Laterality: N/A;  . Cardiac catheterization N/A 03/16/2015    Procedure: Left Heart Cath and Cors/Grafts Angiography;  Surgeon: Josue Hector, MD;  Location: [place] CV LAB;  Service: Cardiovascular;  Laterality: N/A;    Social History   Social History  . Marital Status: Married    Spouse Name: N/A  . Number of Children: N/A  . Years of Education: N/A    Social History Main Topics  . Smoking status: Former Smoker -- 1.00 packs/day for 38 years    Types: Cigarettes    Quit date: 10/01/1996  . Smokeless tobacco: Never Used     Comment: smoked 1960-1998, up 1 ppd  . Alcohol Use: No  . Drug Use: No  . Sexual Activity: Yes   Other Topics Concern  . Not on file   Social History Narrative   Retired   Married   Former smoker   Alcohol use-no   Regular exercise-no    Family History  Problem Relation Age of Onset  . Diabetes Mother   . Coronary artery disease Mother   . Heart attack Father 2  . Heart attack Brother 83  . Heart attack Brother     in his 71s  . Cancer Maternal Grandmother     uterine cancer  . Stroke Neg Hx   . Coronary artery disease Other     M & P aunts/uncles    Review of Systems  Constitutional: Negative for fever, chills, appetite change and fatigue.  HENT: Negative for hearing loss.   Eyes: Negative for visual disturbance.  Respiratory: Negative for cough, shortness of breath and wheezing.   Cardiovascular: Positive for chest pain (occasionally, once a week). Negative for palpitations and leg swelling.  Gastrointestinal: Negative for nausea, abdominal pain, diarrhea, constipation and blood in stool.       Gerd occasionally  Genitourinary: Negative for dysuria, hematuria and difficulty urinating.  Musculoskeletal: Positive for back pain (radiating at times, injections every three months.). Negative for myalgias and arthralgias.  Skin: Negative for color change and rash.  Neurological: Positive for numbness (soetimes in legs from back pain). Negative for dizziness, light-headedness and headaches.  Psychiatric/Behavioral: Negative for dysphoric mood. The patient is not nervous/anxious.        Objective:   Filed Vitals:   01/11/16 0835  BP: 138/74  Pulse: 85  Temp: 97.9 F (36.6 C)  Resp: 16   Filed Weights   01/11/16 0835  Weight: 173 lb (78.472 kg)   Body mass index is 25.54  kg/(m^2).   Physical Exam Constitutional: He appears well-developed and well-nourished. No distress.  HENT:  Head: Normocephalic and atraumatic.  Right Ear: External ear normal.  Left Ear: External ear normal.  Mouth/Throat: Oropharynx is clear and moist.  Normal ear canals and TM b/l  Eyes: Conjunctivae and EOM are normal.  Neck: Neck supple. No tracheal deviation present. No thyromegaly present.  No carotid bruit  Cardiovascular: Normal rate, regular rhythm, normal heart sounds and intact distal pulses.   No murmur heard. Pulmonary/Chest: Effort normal and breath sounds normal. No respiratory distress. He has no wheezes. He has no rales.  Abdominal: Soft.  Bowel sounds are normal. He exhibits no distension. There is no tenderness.  Genitourinary:  deferred  Musculoskeletal: He exhibits no edema.  Lymphadenopathy:    He has no cervical adenopathy.  Skin: Skin is warm and dry. He is not diaphoretic.  Psychiatric: He has a normal mood and affect. His behavior is normal.         Assessment & Plan:   Physical exam: Screening blood work ordered Immunizations up to date Colonoscopy  Up to date Eye exams  Up to date EKG - up to date Exercise - active, but not exercising regularly - encouraged regular exercise Weight - BMI just above normal - encouraged exercise Skin - sees derm Substance abuse - none  See Problem List for Assessment and Plan of chronic medical problems.  Follow up annually

## 2016-01-11 NOTE — Assessment & Plan Note (Signed)
Will check a1c He does not watch what he eats and is not exercising - stressed cutting back on sugars and increasing exercise Check microalbumin

## 2016-01-11 NOTE — Patient Instructions (Signed)
Test(s) ordered today. Your results will be released to Curtis Galloway (or called to you) after review, usually within 72hours after test completion. If any changes need to be made, you will be notified at that same time.  All other Health Maintenance issues reviewed.   All recommended immunizations and age-appropriate screenings are up-to-date or discussed.  No immunizations administered today.   Medications reviewed and updated.  No changes recommended at this time.    Please followup in one year  Health Maintenance, Male A healthy lifestyle and preventative care can promote health and wellness.  Maintain regular health, dental, and eye exams.  Eat a healthy diet. Foods like vegetables, fruits, whole grains, low-fat dairy products, and lean protein foods contain the nutrients you need and are low in calories. Decrease your intake of foods high in solid fats, added sugars, and salt. Get information about a proper diet from your health care provider, if necessary.  Regular physical exercise is one of the most important things you can do for your health. Most adults should get at least 150 minutes of moderate-intensity exercise (any activity that increases your heart rate and causes you to sweat) each week. In addition, most adults need muscle-strengthening exercises on 2 or more days a week.   Maintain a healthy weight. The body mass index (BMI) is a screening tool to identify possible weight problems. It provides an estimate of body fat based on height and weight. Your health care provider can find your BMI and can help you achieve or maintain a healthy weight. For males 20 years and older:  A BMI below 18.5 is considered underweight.  A BMI of 18.5 to 24.9 is normal.  A BMI of 25 to 29.9 is considered overweight.  A BMI of 30 and above is considered obese.  Maintain normal blood lipids and cholesterol by exercising and minimizing your intake of saturated fat. Eat a balanced diet with  plenty of fruits and vegetables. Blood tests for lipids and cholesterol should begin at age 86 and be repeated every 5 years. If your lipid or cholesterol levels are high, you are over age 54, or you are at high risk for heart disease, you may need your cholesterol levels checked more frequently.Ongoing high lipid and cholesterol levels should be treated with medicines if diet and exercise are not working.  If you smoke, find out from your health care provider how to quit. If you do not use tobacco, do not start.  Lung cancer screening is recommended for adults aged 26-80 years who are at high risk for developing lung cancer because of a history of smoking. A yearly low-dose CT scan of the lungs is recommended for people who have at least a 30-pack-year history of smoking and are current smokers or have quit within the past 15 years. A pack year of smoking is smoking an average of 1 pack of cigarettes a day for 1 year (for example, a 30-pack-year history of smoking could mean smoking 1 pack a day for 30 years or 2 packs a day for 15 years). Yearly screening should continue until the smoker has stopped smoking for at least 15 years. Yearly screening should be stopped for people who develop a health problem that would prevent them from having lung cancer treatment.  If you choose to drink alcohol, do not have more than 2 drinks per day. One drink is considered to be 12 oz (360 mL) of beer, 5 oz (150 mL) of wine, or 1.5 oz (45  mL) of liquor.  Avoid the use of street drugs. Do not share needles with anyone. Ask for help if you need support or instructions about stopping the use of drugs.  High blood pressure causes heart disease and increases the risk of stroke. High blood pressure is more likely to develop in:  People who have blood pressure in the end of the normal range (100-139/85-89 mm Hg).  People who are overweight or obese.  People who are African American.  If you are 70-83 years of age, have  your blood pressure checked every 3-5 years. If you are 32 years of age or older, have your blood pressure checked every year. You should have your blood pressure measured twice--once when you are at a hospital or clinic, and once when you are not at a hospital or clinic. Record the average of the two measurements. To check your blood pressure when you are not at a hospital or clinic, you can use:  An automated blood pressure machine at a pharmacy.  A home blood pressure monitor.  If you are 44-74 years old, ask your health care provider if you should take aspirin to prevent heart disease.  Diabetes screening involves taking a blood sample to check your fasting blood sugar level. This should be done once every 3 years after age 49 if you are at a normal weight and without risk factors for diabetes. Testing should be considered at a younger age or be carried out more frequently if you are overweight and have at least 1 risk factor for diabetes.  Colorectal cancer can be detected and often prevented. Most routine colorectal cancer screening begins at the age of 16 and continues through age 44. However, your health care provider may recommend screening at an earlier age if you have risk factors for colon cancer. On a yearly basis, your health care provider may provide home test kits to check for hidden blood in the stool. A small camera at the end of a tube may be used to directly examine the colon (sigmoidoscopy or colonoscopy) to detect the earliest forms of colorectal cancer. Talk to your health care provider about this at age 86 when routine screening begins. A direct exam of the colon should be repeated every 5-10 years through age 24, unless early forms of precancerous polyps or small growths are found.  People who are at an increased risk for hepatitis B should be screened for this virus. You are considered at high risk for hepatitis B if:  You were born in a country where hepatitis B occurs often.  Talk with your health care provider about which countries are considered high risk.  Your parents were born in a high-risk country and you have not received a shot to protect against hepatitis B (hepatitis B vaccine).  You have HIV or AIDS.  You use needles to inject street drugs.  You live with, or have sex with, someone who has hepatitis B.  You are a man who has sex with other men (MSM).  You get hemodialysis treatment.  You take certain medicines for conditions like cancer, organ transplantation, and autoimmune conditions.  Hepatitis C blood testing is recommended for all people born from [birth year] through 1963 and any individual with known risk factors for hepatitis C.  Healthy men should no longer receive prostate-specific antigen (PSA) blood tests as part of routine cancer screening. Talk to your health care provider about prostate cancer screening.  Testicular cancer screening is not recommended for adolescents  or adult males who have no symptoms. Screening includes self-exam, a health care provider exam, and other screening tests. Consult with your health care provider about any symptoms you have or any concerns you have about testicular cancer.  Practice safe sex. Use condoms and avoid high-risk sexual practices to reduce the spread of sexually transmitted infections (STIs).  You should be screened for STIs, including gonorrhea and chlamydia if:  You are sexually active and are younger than 24 years.  You are older than 24 years, and your health care provider tells you that you are at risk for this type of infection.  Your sexual activity has changed since you were last screened, and you are at an increased risk for chlamydia or gonorrhea. Ask your health care provider if you are at risk.  If you are at risk of being infected with HIV, it is recommended that you take a prescription medicine daily to prevent HIV infection. This is called pre-exposure prophylaxis (PrEP). You are  considered at risk if:  You are a man who has sex with other men (MSM).  You are a heterosexual man who is sexually active with multiple partners.  You take drugs by injection.  You are sexually active with a partner who has HIV.  Talk with your health care provider about whether you are at high risk of being infected with HIV. If you choose to begin PrEP, you should first be tested for HIV. You should then be tested every 3 months for as long as you are taking PrEP.  Use sunscreen. Apply sunscreen liberally and repeatedly throughout the day. You should seek shade when your shadow is shorter than you. Protect yourself by wearing long sleeves, pants, a wide-brimmed hat, and sunglasses year round whenever you are outdoors.  Tell your health care provider of new moles or changes in moles, especially if there is a change in shape or color. Also, tell your health care provider if a mole is larger than the size of a pencil eraser.  A one-time screening for abdominal aortic aneurysm (AAA) and surgical repair of large AAAs by ultrasound is recommended for men aged 55-75 years who are current or former smokers.  Stay current with your vaccines (immunizations).   This information is not intended to replace advice given to you by your health care provider. Make sure you discuss any questions you have with your health care provider.   Document Released: 03/14/2008 Document Revised: 10/07/2014 Document Reviewed: 02/11/2011 Elsevier Interactive Patient Education [employer].

## 2016-01-11 NOTE — Progress Notes (Signed)
Pre visit review using our clinic review tool, if applicable. No additional management support is needed unless otherwise documented below in the visit note. 

## 2016-01-15 ENCOUNTER — Encounter: Payer: Self-pay | Admitting: Internal Medicine

## 2016-02-27 DIAGNOSIS — M961 Postlaminectomy syndrome, not elsewhere classified: Secondary | ICD-10-CM | POA: Diagnosis not present

## 2016-03-12 ENCOUNTER — Encounter: Payer: Self-pay | Admitting: Cardiovascular Disease

## 2016-03-18 DIAGNOSIS — N528 Other male erectile dysfunction: Secondary | ICD-10-CM | POA: Diagnosis not present

## 2016-03-18 DIAGNOSIS — N400 Enlarged prostate without lower urinary tract symptoms: Secondary | ICD-10-CM | POA: Diagnosis not present

## 2016-03-18 DIAGNOSIS — Z85528 Personal history of other malignant neoplasm of kidney: Secondary | ICD-10-CM | POA: Diagnosis not present

## 2016-03-19 NOTE — Progress Notes (Signed)
Patient ID: Curtis Galloway, male   DOB: Oct 10, 1940, 75 y.o.   MRN: [id number]   51 y.o. f/u CAD . Cath  2013 showed occluded grafts and had stent to LAD .No angina. Wallks daily. Intolerant to nitrates with headache   Compliant with meds   Cath revealed:  1. 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  2. Unstable angina secondary to severe hazy stenosis in proximal LAD  3. Successful PTCA/DES x 1 proximal LAD  4. Preserved LV systolic function with akinesis of the inferior wall at the base   Grafts:  SVG: to PDA occluded New since 2003  SVG: Diagonal occluded New since 2003  Free Rima: Came off diagonal graft atretic in 2003  [place]: Known to be occluded cath 2003   Effient stopped l 2/16  Needs pain management for back  Having more chest pain. Stubborn about taking nitro for  Pain at rest and with exertion last few weeks  Compliant with meds  Cath June 2016 for recurrent symptoms no critical disease   Ost RCA lesion, 100% stenosed.  Ost LM to LM lesion, 30% stenosed.  1st Mrg lesion, 40% stenosed.  Mid Cx to Dist Cx lesion, 50% stenosed.  1st Diag lesion, 80% stenosed.  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 Patent stent in proximal LAD with good left to right collaterals Moderate disease in circumflex /OM2 Unchanged from cath 2015 with widely patent proximal LAD stent EF 55% inferobasal hypokinesis Continue Medical Rx  May have tens unit for back pain sees Joya Salm Also inquired about penile implant with Lawerance Bach  Told him he would need stress test before being put to sleep  ROS: Denies fever, malais, weight loss, blurry vision, decreased visual acuity, cough, sputum, SOB, hemoptysis, pleuritic pain, palpitaitons, heartburn, abdominal pain, melena, lower extremity  edema, claudication, or rash.  All other systems reviewed and negative  General: Affect appropriate Healthy:  appears stated age 39: normal Neck supple with no adenopathy JVP normal no bruits no thyromegaly Lungs clear with no wheezing and good diaphragmatic motion Heart:  S1/S2 no murmur, no rub, gallop or click PMI normal Abdomen: benighn, BS positve, no tenderness, no AAA no bruit.  No HSM or HJR Distal pulses intact with no bruits No edema Neuro non-focal Skin warm and dry No muscular weakness   Current Outpatient Prescriptions  Medication Sig Dispense Refill  . aspirin 325 MG tablet Take 325 mg by mouth daily.    Marland Kitchen atorvastatin (LIPITOR) 80 MG tablet Take 1 tablet (80 mg total) by mouth at bedtime. 90 tablet 2  . fish oil-omega-3 fatty acids 1000 MG capsule Take 2 g by mouth daily.     . hydrochlorothiazide (MICROZIDE) 12.5 MG capsule Take 1 capsule (12.5 mg total) by mouth daily. 90 capsule 2  . metoprolol succinate (TOPROL-XL) 50 MG 24 hr tablet Take 1 tablet (50 mg total) by mouth daily. Take with or immediately following a meal. 90 tablet 2  . Multiple Vitamin (MULTIVITAMIN) tablet Take 1 tablet by mouth daily.      . nitroGLYCERIN (NITROSTAT) 0.4 MG SL tablet Place 1 tablet (0.4 mg total) under the tongue every 5 (five) minutes as needed for chest pain. 25 tablet 4   No current facility-administered medications for this visit.    Allergies  Codeine and  Tramadol  Electrocardiogram:  2/15  SR rate 94 nonspecific ST/T wave changes  03/20/16 SR arte 69  Non specific ST changes   Assessment and Plan CAD:  Distant CABG with occluded grafts.  Occluded native RCA with stent in native LAD.  Cath in June 2016 stable.  Medical Rx  Continue beta blocker ASA  Chol:  On statin   Lab Results  Component Value Date   LDLCALC 92 01/11/2016     HTN:  Well controlled.  Continue current medications and low sodium Dash type diet.    Pneumonia:  Has had vaccine  F/u CXR  clear improved normal exam  Pre op:  Would need myovue before any surgery for TENS unit or penile implant  Jenkins Rouge

## 2016-03-20 ENCOUNTER — Ambulatory Visit (INDEPENDENT_AMBULATORY_CARE_PROVIDER_SITE_OTHER): Payer: PPO | Admitting: Cardiovascular Disease

## 2016-03-20 ENCOUNTER — Encounter: Payer: Self-pay | Admitting: Cardiovascular Disease

## 2016-03-20 VITALS — BP 122/68 | HR 52 | Ht 69.0 in | Wt 170.0 lb

## 2016-03-20 DIAGNOSIS — I2583 Coronary atherosclerosis due to lipid rich plaque: Principal | ICD-10-CM

## 2016-03-20 DIAGNOSIS — I10 Essential (primary) hypertension: Secondary | ICD-10-CM | POA: Diagnosis not present

## 2016-03-20 DIAGNOSIS — I2510 Atherosclerotic heart disease of native coronary artery without angina pectoris: Secondary | ICD-10-CM

## 2016-03-20 NOTE — Patient Instructions (Signed)

## 2016-03-25 DIAGNOSIS — M5416 Radiculopathy, lumbar region: Secondary | ICD-10-CM | POA: Diagnosis not present

## 2016-03-25 DIAGNOSIS — M961 Postlaminectomy syndrome, not elsewhere classified: Secondary | ICD-10-CM | POA: Diagnosis not present

## 2016-04-10 ENCOUNTER — Other Ambulatory Visit: Payer: Self-pay | Admitting: Gastroenterology

## 2016-04-10 DIAGNOSIS — R109 Unspecified abdominal pain: Secondary | ICD-10-CM

## 2016-04-11 ENCOUNTER — Ambulatory Visit
Admission: RE | Admit: 2016-04-11 | Discharge: 2016-04-11 | Disposition: A | Discharge status: Home / Self Care | Payer: PPO | Source: Ambulatory Visit | Attending: Gastroenterology | Admitting: Gastroenterology

## 2016-04-11 DIAGNOSIS — R109 Unspecified abdominal pain: Secondary | ICD-10-CM | POA: Diagnosis not present

## 2016-04-11 MED ORDER — IOPAMIDOL (ISOVUE-300) INJECTION 61%
100.0000 mL | Freq: Once | INTRAVENOUS | Status: AC | PRN
  Administered 2016-04-11: 80 mL via INTRAVENOUS

## 2016-05-06 DIAGNOSIS — R109 Unspecified abdominal pain: Secondary | ICD-10-CM | POA: Diagnosis not present

## 2016-05-10 ENCOUNTER — Other Ambulatory Visit: Payer: Self-pay | Admitting: Gastroenterology

## 2016-05-10 ENCOUNTER — Other Ambulatory Visit (HOSPITAL_COMMUNITY): Payer: Self-pay | Admitting: Gastroenterology

## 2016-05-10 DIAGNOSIS — R109 Unspecified abdominal pain: Secondary | ICD-10-CM | POA: Diagnosis not present

## 2016-05-10 DIAGNOSIS — R140 Abdominal distension (gaseous): Secondary | ICD-10-CM | POA: Diagnosis not present

## 2016-05-10 DIAGNOSIS — R1084 Generalized abdominal pain: Secondary | ICD-10-CM

## 2016-05-10 DIAGNOSIS — R110 Nausea: Secondary | ICD-10-CM

## 2016-05-10 DIAGNOSIS — K3184 Gastroparesis: Secondary | ICD-10-CM

## 2016-05-16 ENCOUNTER — Ambulatory Visit
Admission: RE | Admit: 2016-05-16 | Discharge: 2016-05-16 | Disposition: A | Discharge status: Home / Self Care | Payer: PPO | Source: Ambulatory Visit | Attending: Gastroenterology | Admitting: Gastroenterology

## 2016-05-16 DIAGNOSIS — K8020 Calculus of gallbladder without cholecystitis without obstruction: Secondary | ICD-10-CM | POA: Diagnosis not present

## 2016-05-16 DIAGNOSIS — R1084 Generalized abdominal pain: Secondary | ICD-10-CM

## 2016-05-17 ENCOUNTER — Ambulatory Visit (HOSPITAL_COMMUNITY)
Admission: RE | Admit: 2016-05-17 | Discharge: 2016-05-17 | Disposition: A | Discharge status: Home / Self Care | Payer: PPO | Source: Ambulatory Visit | Attending: Gastroenterology | Admitting: Gastroenterology

## 2016-05-17 DIAGNOSIS — R140 Abdominal distension (gaseous): Secondary | ICD-10-CM

## 2016-05-17 DIAGNOSIS — R109 Unspecified abdominal pain: Secondary | ICD-10-CM | POA: Diagnosis not present

## 2016-05-17 DIAGNOSIS — R110 Nausea: Secondary | ICD-10-CM

## 2016-05-17 DIAGNOSIS — K3184 Gastroparesis: Secondary | ICD-10-CM

## 2016-05-17 MED ORDER — TECHNETIUM TC 99M SULFUR COLLOID
2.0000 | Freq: Once | INTRAVENOUS | Status: AC | PRN
  Administered 2016-05-17: 2 via ORAL

## 2016-05-20 ENCOUNTER — Other Ambulatory Visit: Payer: PPO

## 2016-05-27 DIAGNOSIS — M5416 Radiculopathy, lumbar region: Secondary | ICD-10-CM | POA: Diagnosis not present

## 2016-05-27 DIAGNOSIS — M961 Postlaminectomy syndrome, not elsewhere classified: Secondary | ICD-10-CM | POA: Diagnosis not present

## 2016-06-04 DIAGNOSIS — H35031 Hypertensive retinopathy, right eye: Secondary | ICD-10-CM | POA: Diagnosis not present

## 2016-06-04 DIAGNOSIS — H35032 Hypertensive retinopathy, left eye: Secondary | ICD-10-CM | POA: Diagnosis not present

## 2016-06-04 DIAGNOSIS — H34831 Tributary (branch) retinal vein occlusion, right eye, with macular edema: Secondary | ICD-10-CM | POA: Diagnosis not present

## 2016-06-05 DIAGNOSIS — M1711 Unilateral primary osteoarthritis, right knee: Secondary | ICD-10-CM | POA: Diagnosis not present

## 2016-06-12 ENCOUNTER — Encounter (INDEPENDENT_AMBULATORY_CARE_PROVIDER_SITE_OTHER): Payer: PPO | Admitting: Ophthalmology

## 2016-06-12 DIAGNOSIS — H2513 Age-related nuclear cataract, bilateral: Secondary | ICD-10-CM

## 2016-06-12 DIAGNOSIS — H43813 Vitreous degeneration, bilateral: Secondary | ICD-10-CM | POA: Diagnosis not present

## 2016-06-12 DIAGNOSIS — H34831 Tributary (branch) retinal vein occlusion, right eye, with macular edema: Secondary | ICD-10-CM | POA: Diagnosis not present

## 2016-06-12 DIAGNOSIS — I10 Essential (primary) hypertension: Secondary | ICD-10-CM | POA: Diagnosis not present

## 2016-06-12 DIAGNOSIS — H35033 Hypertensive retinopathy, bilateral: Secondary | ICD-10-CM

## 2016-07-04 DIAGNOSIS — M1711 Unilateral primary osteoarthritis, right knee: Secondary | ICD-10-CM | POA: Diagnosis not present

## 2016-07-10 ENCOUNTER — Encounter (INDEPENDENT_AMBULATORY_CARE_PROVIDER_SITE_OTHER): Payer: PPO | Admitting: Ophthalmology

## 2016-07-10 DIAGNOSIS — H35033 Hypertensive retinopathy, bilateral: Secondary | ICD-10-CM | POA: Diagnosis not present

## 2016-07-10 DIAGNOSIS — H43813 Vitreous degeneration, bilateral: Secondary | ICD-10-CM | POA: Diagnosis not present

## 2016-07-10 DIAGNOSIS — M1711 Unilateral primary osteoarthritis, right knee: Secondary | ICD-10-CM | POA: Diagnosis not present

## 2016-07-10 DIAGNOSIS — H34831 Tributary (branch) retinal vein occlusion, right eye, with macular edema: Secondary | ICD-10-CM | POA: Diagnosis not present

## 2016-07-10 DIAGNOSIS — I10 Essential (primary) hypertension: Secondary | ICD-10-CM

## 2016-07-11 ENCOUNTER — Telehealth: Payer: Self-pay | Admitting: Internal Medicine

## 2016-07-11 NOTE — Telephone Encounter (Signed)
Updated.

## 2016-07-11 NOTE — Telephone Encounter (Signed)
Please update chart to reflect flu shot.  Got on 10/5 at [facility].

## 2016-07-17 DIAGNOSIS — M1711 Unilateral primary osteoarthritis, right knee: Secondary | ICD-10-CM | POA: Diagnosis not present

## 2016-08-07 ENCOUNTER — Encounter (INDEPENDENT_AMBULATORY_CARE_PROVIDER_SITE_OTHER): Payer: PPO | Admitting: Ophthalmology

## 2016-08-07 DIAGNOSIS — H34831 Tributary (branch) retinal vein occlusion, right eye, with macular edema: Secondary | ICD-10-CM

## 2016-08-07 DIAGNOSIS — I10 Essential (primary) hypertension: Secondary | ICD-10-CM

## 2016-08-07 DIAGNOSIS — H43813 Vitreous degeneration, bilateral: Secondary | ICD-10-CM | POA: Diagnosis not present

## 2016-08-07 DIAGNOSIS — H2513 Age-related nuclear cataract, bilateral: Secondary | ICD-10-CM | POA: Diagnosis not present

## 2016-08-07 DIAGNOSIS — H35033 Hypertensive retinopathy, bilateral: Secondary | ICD-10-CM

## 2016-08-08 DIAGNOSIS — M1711 Unilateral primary osteoarthritis, right knee: Secondary | ICD-10-CM | POA: Diagnosis not present

## 2016-08-15 DIAGNOSIS — M1711 Unilateral primary osteoarthritis, right knee: Secondary | ICD-10-CM | POA: Diagnosis not present

## 2016-08-27 DIAGNOSIS — M5416 Radiculopathy, lumbar region: Secondary | ICD-10-CM | POA: Diagnosis not present

## 2016-08-27 DIAGNOSIS — M961 Postlaminectomy syndrome, not elsewhere classified: Secondary | ICD-10-CM | POA: Diagnosis not present

## 2016-09-04 ENCOUNTER — Encounter (INDEPENDENT_AMBULATORY_CARE_PROVIDER_SITE_OTHER): Payer: PPO | Admitting: Ophthalmology

## 2016-09-04 DIAGNOSIS — H35033 Hypertensive retinopathy, bilateral: Secondary | ICD-10-CM

## 2016-09-04 DIAGNOSIS — H34831 Tributary (branch) retinal vein occlusion, right eye, with macular edema: Secondary | ICD-10-CM | POA: Diagnosis not present

## 2016-09-04 DIAGNOSIS — H2513 Age-related nuclear cataract, bilateral: Secondary | ICD-10-CM

## 2016-09-04 DIAGNOSIS — I10 Essential (primary) hypertension: Secondary | ICD-10-CM

## 2016-09-04 DIAGNOSIS — H43813 Vitreous degeneration, bilateral: Secondary | ICD-10-CM | POA: Diagnosis not present

## 2016-09-12 ENCOUNTER — Telehealth: Payer: Self-pay | Admitting: Internal Medicine

## 2016-09-12 ENCOUNTER — Ambulatory Visit (INDEPENDENT_AMBULATORY_CARE_PROVIDER_SITE_OTHER): Payer: PPO | Admitting: Internal Medicine

## 2016-09-12 DIAGNOSIS — J069 Acute upper respiratory infection, unspecified: Secondary | ICD-10-CM | POA: Diagnosis not present

## 2016-09-12 DIAGNOSIS — B9789 Other viral agents as the cause of diseases classified elsewhere: Secondary | ICD-10-CM

## 2016-09-12 MED ORDER — PREDNISONE 20 MG PO TABS: 40.0000 mg | ORAL_TABLET | Freq: Every day | ORAL | 0 refills | Status: DC

## 2016-09-12 NOTE — Telephone Encounter (Signed)
°  Patient Name: Curtis Galloway  DOB: August 13, 1941    Initial Comment Caller states, her husband started having a cold and allergies on Monday. He is having some discomfort in his chest. Not chest pain. Congestion pain. No fever. Does have heart issues. Coughing. Verified    Nurse Assessment  Nurse: Julien Girt, RN, Almyra Free Date/Time Eilene Ghazi Time): 09/12/2016 10:26:35 AM  Confirm and document reason for call. If symptomatic, describe symptoms. ---Caller states her husband has had cold/cough sx on Monday and his chest is sore from coughing. Spoke to [place], he denies chest pain or sob. He is coughing up secretions, nasal drainage is clear. No fever.  Does the patient have any new or worsening symptoms? ---Yes  Will a triage be completed? ---Yes  Related visit to physician within the last 2 weeks? ---No  Does the PT have any chronic conditions? (i.e. diabetes, asthma, etc.) ---Yes  List chronic conditions. ---Hx CABG, cardiac stents x3, Hx pneumonia, High cholesterol, Htn  Is this a behavioral health or substance abuse call? ---No     Guidelines    Guideline Title Affirmed Question Affirmed Notes  Cough - Acute Productive [1] Continuous (nonstop) coughing interferes with work or school AND [2] no improvement using cough treatment per Care Advice    Final Disposition User   See Physician within 24 Hours Julien Girt, RN, [employer]    Referrals  REFERRED TO PCP OFFICE   Disagree/Comply: Comply   Appointment scheduled per office

## 2016-09-12 NOTE — Progress Notes (Signed)
Pre visit review using our clinic review tool, if applicable. No additional management support is needed unless otherwise documented below in the visit note. 

## 2016-09-12 NOTE — Assessment & Plan Note (Signed)
Rx for prednisone for some mild wheezing with coughing. Also advised to take zyrtec over the counter. No indication for antibiotics and expected course discussed with patient and his wife.

## 2016-09-12 NOTE — Progress Notes (Signed)
   Subjective:    Patient ID: Curtis Galloway, male    DOB: 03/23/1941, 75 y.o.   MRN: [id number]  HPI The patient is a 75 YO male coming in for cough and cold symptoms since Monday. Started with nose drainage and congestion and cough. Not very productive but small amounts of white sputum. No SOB with exertion. No fevers or chills. No sick contacts but they went to an event on Saturday where some people may have been sick. Tried chlorocedin hbp and tylenol which did not help much. Overall symptoms are stable since onset.   Review of Systems  Constitutional: Negative for activity change, appetite change, fatigue, fever and unexpected weight change.  HENT: Positive for congestion, postnasal drip and rhinorrhea. Negative for ear discharge, ear pain, sinus pain, sinus pressure, sore throat, tinnitus and trouble swallowing.   Eyes: Negative.   Respiratory: Positive for cough. Negative for chest tightness, shortness of breath and wheezing.   Cardiovascular: Negative.   Gastrointestinal: Negative.   Musculoskeletal: Negative.       Objective:   Physical Exam  Constitutional: He is oriented to person, place, and time. He appears well-developed and well-nourished. No distress.  HENT:  Head: Normocephalic and atraumatic.  Right Ear: External ear normal.  Left Ear: External ear normal.  Oropharynx with redness and clear drainage   Eyes: EOM are normal.  Neck: Normal range of motion.  Cardiovascular: Normal rate and regular rhythm.   Pulmonary/Chest: Effort normal and breath sounds normal. No respiratory distress. He has no wheezes. He has no rales.  Abdominal: Soft.  Lymphadenopathy:    He has no cervical adenopathy.  Neurological: He is alert and oriented to person, place, and time.  Skin: Skin is warm and dry.   Vitals:   09/12/16 1542  BP: (!) 178/88  Pulse: 75  Resp: 18  Temp: 98 F (36.7 C)  TempSrc: Oral  Weight: 172 lb (78 kg)      Assessment & Plan:

## 2016-09-12 NOTE — Patient Instructions (Signed)
We have sent in the prednisone for the sinuses. Take 2 pills daily for the next 5 days.   If you are not feeling better call the office back.   It is also okay to take zyrtec to help dry up the sinuses quicker.

## 2016-10-01 ENCOUNTER — Encounter (INDEPENDENT_AMBULATORY_CARE_PROVIDER_SITE_OTHER): Payer: PPO | Admitting: Ophthalmology

## 2016-10-01 DIAGNOSIS — H43813 Vitreous degeneration, bilateral: Secondary | ICD-10-CM

## 2016-10-01 DIAGNOSIS — H34831 Tributary (branch) retinal vein occlusion, right eye, with macular edema: Secondary | ICD-10-CM

## 2016-10-01 DIAGNOSIS — H35033 Hypertensive retinopathy, bilateral: Secondary | ICD-10-CM

## 2016-10-01 DIAGNOSIS — I10 Essential (primary) hypertension: Secondary | ICD-10-CM

## 2016-10-02 DIAGNOSIS — M961 Postlaminectomy syndrome, not elsewhere classified: Secondary | ICD-10-CM | POA: Diagnosis not present

## 2016-10-02 DIAGNOSIS — M5416 Radiculopathy, lumbar region: Secondary | ICD-10-CM | POA: Diagnosis not present

## 2016-10-02 DIAGNOSIS — Z6825 Body mass index (BMI) 25.0-25.9, adult: Secondary | ICD-10-CM | POA: Diagnosis not present

## 2016-10-02 DIAGNOSIS — I10 Essential (primary) hypertension: Secondary | ICD-10-CM | POA: Diagnosis not present

## 2016-10-17 NOTE — Progress Notes (Signed)
Patient ID: Curtis Galloway, male   DOB: 08-17-1941, 76 y.o.   MRN: [id number]   87 y.o. f/u CAD . Cath  2013 showed occluded grafts and had stent to LAD .No angina. Wallks daily. Intolerant to nitrates with headache   Compliant with meds   Cath revealed:  1. 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  2. Unstable angina secondary to severe hazy stenosis in proximal LAD  3. Successful PTCA/DES x 1 proximal LAD  4. Preserved LV systolic function with akinesis of the inferior wall at the base   Grafts:  SVG: to PDA occluded New since 2003  SVG: Diagonal occluded New since 2003  Free Rima: Came off diagonal graft atretic in 2003  [place]: Known to be occluded cath 2003   Effient stopped l 2/16  Needs pain management for back    Cath June 2016 for recurrent symptoms no critical disease   Ost RCA lesion, 100% stenosed.  Ost LM to LM lesion, 30% stenosed.  1st Mrg lesion, 40% stenosed.  Mid Cx to Dist Cx lesion, 50% stenosed.  1st Diag lesion, 80% stenosed.  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 Patent stent in proximal LAD with good left to right collaterals Moderate disease in circumflex /OM2 Unchanged from cath 2015 with widely patent proximal LAD stent EF 55% inferobasal hypokinesis Continue Medical Rx  May have tens unit for back pain sees Joya Salm Also inquired about penile implant with Lawerance Bach  Told him he would need stress test before being put to sleep  ROS: Denies fever, malais, weight loss, blurry vision, decreased visual acuity, cough, sputum, SOB, hemoptysis, pleuritic pain, palpitaitons, heartburn, abdominal pain, melena, lower extremity edema, claudication, or rash.  All other systems reviewed and negative  General: Affect appropriate Healthy:  appears  stated age 45: normal Neck supple with no adenopathy JVP normal no bruits no thyromegaly Lungs clear with no wheezing and good diaphragmatic motion Heart:  S1/S2 no murmur, no rub, gallop or click PMI normal Abdomen: benighn, BS positve, no tenderness, no AAA no bruit.  No HSM or HJR Distal pulses intact with no bruits No edema Neuro non-focal Skin warm and dry No muscular weakness   Current Outpatient Prescriptions  Medication Sig Dispense Refill  . aspirin 325 MG tablet Take 325 mg by mouth daily.    Marland Kitchen atorvastatin (LIPITOR) 80 MG tablet Take 1 tablet (80 mg total) by mouth at bedtime. 90 tablet 3  . losartan-hydrochlorothiazide (HYZAAR) 50-12.5 MG tablet Take 1 tablet by mouth daily. 90 tablet 3  . metoprolol succinate (TOPROL XL) 25 MG 24 hr tablet Take 1 tablet (25 mg total) by mouth daily. Take with metoprolol 50 mg 90 tablet 3  . metoprolol succinate (TOPROL-XL) 50 MG 24 hr tablet Take 1 tablet (50 mg total) by mouth daily. Take with Metoprolol 25 mg tablet 90 tablet 3  . Multiple Vitamin (MULTIVITAMIN) tablet Take 1 tablet by mouth daily.      . nitroGLYCERIN (NITROSTAT) 0.4 MG SL tablet Place 1 tablet (0.4 mg total) under the tongue every 5 (five) minutes as needed for chest pain. 25 tablet 4  . Omega-3 Fatty Acids (FISH OIL PO) Take 1 capsule by mouth daily. Mega Red brand    . losartan-hydrochlorothiazide (HYZAAR) 50-12.5 MG tablet Take 1 tablet by mouth daily.  7 tablet 0   No current facility-administered medications for this visit.     Allergies  Codeine and Tramadol  Electrocardiogram:  2/15  SR rate 94 nonspecific ST/T wave changes  03/20/16 SR arte 69  Non specific ST changes  10/18/16 SR rate 70 old IMI artifact   Assessment and Plan CAD:  Distant CABG with occluded grafts.  Occluded native RCA with stent in native LAD.  Cath in June 2016 stable.  Medical Rx  Continue beta blocker ASA  Chol:  On statin   Lab Results  Component Value Date   LDLCALC 92  01/11/2016     HTN:  With elevated HR increase Toprol to 75 mg and add ARB to diuretic    Pneumonia:  Has had vaccine  F/u CXR clear improved normal exam  Pre op:  Would need myovue before any surgery for TENS unit or penile implant  Jenkins Rouge

## 2016-10-18 ENCOUNTER — Encounter: Payer: Self-pay | Admitting: Cardiovascular Disease

## 2016-10-18 ENCOUNTER — Ambulatory Visit (INDEPENDENT_AMBULATORY_CARE_PROVIDER_SITE_OTHER): Payer: PPO | Admitting: Cardiovascular Disease

## 2016-10-18 VITALS — BP 152/72 | HR 93 | Resp 18 | Ht 69.0 in | Wt 169.0 lb

## 2016-10-18 DIAGNOSIS — R079 Chest pain, unspecified: Secondary | ICD-10-CM

## 2016-10-18 DIAGNOSIS — I10 Essential (primary) hypertension: Secondary | ICD-10-CM | POA: Diagnosis not present

## 2016-10-18 MED ORDER — METOPROLOL SUCCINATE ER 25 MG PO TB24: 25.0000 mg | ORAL_TABLET | Freq: Every day | ORAL | 3 refills | Status: DC

## 2016-10-18 MED ORDER — METOPROLOL SUCCINATE ER 50 MG PO TB24
50.0000 mg | ORAL_TABLET | Freq: Every day | ORAL | 3 refills | Status: AC
Start: 2016-10-18 — End: ?

## 2016-10-18 MED ORDER — ATORVASTATIN CALCIUM 80 MG PO TABS: 80.0000 mg | ORAL_TABLET | Freq: Every day | ORAL | 3 refills | Status: AC

## 2016-10-18 MED ORDER — LOSARTAN POTASSIUM-HCTZ 50-12.5 MG PO TABS: 1.0000 | ORAL_TABLET | Freq: Every day | ORAL | 0 refills | Status: DC

## 2016-10-18 MED ORDER — LOSARTAN POTASSIUM-HCTZ 50-12.5 MG PO TABS: 1.0000 | ORAL_TABLET | Freq: Every day | ORAL | 3 refills | Status: DC

## 2016-10-18 NOTE — Patient Instructions (Addendum)
Medication Instructions:  Your physician has recommended you make the following change in your medication:  1- START Hyzaar 50 - 12.5 mg by mouth daily  2- INCREASE Metoprolol to 75 mg by mouth daily- take 50 mg tablet with 25 mg tablet to equal 75 mg  3- STOP Hydrochlorothiazide   Labwork: NONE  Testing/Procedures: NONE  Follow-Up: Your physician wants you to follow-up in: 8 weeks with Dr. Johnsie Cancel.    If you need a refill on your cardiac medications before your next appointment, please call your pharmacy.

## 2016-10-28 ENCOUNTER — Encounter (INDEPENDENT_AMBULATORY_CARE_PROVIDER_SITE_OTHER): Payer: PPO | Admitting: Ophthalmology

## 2016-10-28 DIAGNOSIS — H34831 Tributary (branch) retinal vein occlusion, right eye, with macular edema: Secondary | ICD-10-CM | POA: Diagnosis not present

## 2016-10-28 DIAGNOSIS — H35033 Hypertensive retinopathy, bilateral: Secondary | ICD-10-CM | POA: Diagnosis not present

## 2016-10-28 DIAGNOSIS — H43813 Vitreous degeneration, bilateral: Secondary | ICD-10-CM | POA: Diagnosis not present

## 2016-10-28 DIAGNOSIS — H2513 Age-related nuclear cataract, bilateral: Secondary | ICD-10-CM

## 2016-10-28 DIAGNOSIS — I10 Essential (primary) hypertension: Secondary | ICD-10-CM

## 2016-10-29 DIAGNOSIS — M545 Low back pain: Secondary | ICD-10-CM | POA: Diagnosis not present

## 2016-10-29 DIAGNOSIS — M25552 Pain in left hip: Secondary | ICD-10-CM | POA: Diagnosis not present

## 2016-11-14 DIAGNOSIS — Z01 Encounter for other special examination without complaint, suspected or reported diagnosis: Secondary | ICD-10-CM | POA: Diagnosis not present

## 2016-11-18 ENCOUNTER — Encounter (HOSPITAL_COMMUNITY): Payer: Self-pay

## 2016-11-18 ENCOUNTER — Emergency Department (HOSPITAL_COMMUNITY)
Admission: EM | Admit: 2016-11-18 | Discharge: 2016-11-18 | Disposition: A | Discharge status: Home / Self Care | Payer: PPO | Attending: Emergency Medicine | Admitting: Emergency Medicine

## 2016-11-18 ENCOUNTER — Emergency Department (HOSPITAL_COMMUNITY): Payer: PPO

## 2016-11-18 DIAGNOSIS — Z87891 Personal history of nicotine dependence: Secondary | ICD-10-CM | POA: Insufficient documentation

## 2016-11-18 DIAGNOSIS — Z955 Presence of coronary angioplasty implant and graft: Secondary | ICD-10-CM | POA: Insufficient documentation

## 2016-11-18 DIAGNOSIS — M5030 Other cervical disc degeneration, unspecified cervical region: Secondary | ICD-10-CM | POA: Diagnosis not present

## 2016-11-18 DIAGNOSIS — Z7982 Long term (current) use of aspirin: Secondary | ICD-10-CM | POA: Diagnosis not present

## 2016-11-18 DIAGNOSIS — I10 Essential (primary) hypertension: Secondary | ICD-10-CM | POA: Diagnosis not present

## 2016-11-18 DIAGNOSIS — Z951 Presence of aortocoronary bypass graft: Secondary | ICD-10-CM | POA: Insufficient documentation

## 2016-11-18 DIAGNOSIS — I25110 Atherosclerotic heart disease of native coronary artery with unstable angina pectoris: Secondary | ICD-10-CM | POA: Insufficient documentation

## 2016-11-18 DIAGNOSIS — Z79899 Other long term (current) drug therapy: Secondary | ICD-10-CM | POA: Diagnosis not present

## 2016-11-18 DIAGNOSIS — E119 Type 2 diabetes mellitus without complications: Secondary | ICD-10-CM | POA: Insufficient documentation

## 2016-11-18 DIAGNOSIS — M542 Cervicalgia: Secondary | ICD-10-CM | POA: Diagnosis not present

## 2016-11-18 MED ORDER — HYDROCODONE-ACETAMINOPHEN 5-325 MG PO TABS: 1.0000 | ORAL_TABLET | Freq: Four times a day (QID) | ORAL | 0 refills | Status: DC | PRN

## 2016-11-18 MED ORDER — HYDROMORPHONE HCL 2 MG/ML IJ SOLN
0.5000 mg | Freq: Once | INTRAMUSCULAR | Status: AC
  Administered 2016-11-18: 0.5 mg via INTRAVENOUS
  Filled 2016-11-18: qty 1

## 2016-11-18 NOTE — ED Triage Notes (Signed)
Patient complains of ongoing neck pain for several weeks, has ben seen by ortho for same and had 2 rounds of prednisone. Has taken with minimal relief, states known disc problems

## 2016-11-18 NOTE — ED Provider Notes (Signed)
[place] DEPT Provider Note   CSN: [id number] Arrival date & time: 11/18/16  1236     History   Chief Complaint Chief Complaint  Patient presents with  . Neck Pain    HPI Curtis Galloway is a 76 y.o. male.  HPI Pt sneezed the end of January.  He suddenly started having pain in his neck, the pain is more on the left than the right but it is the proximal portion of the neck.   He went to [employer].   He was started on prednisone the following week. He has not helped.   He and his wife went on a trip.  While on the trip he had to seek medical care.  He has tried ibuprofen.  It has not helped.    Past Medical History:  Diagnosis Date  . Abnormal nuclear stress test 11/16/2013  . Coronary artery disease    Dr. Johnsie Cancel,  stent to LAD  . GERD (gastroesophageal reflux disease)   . Hyperlipidemia   . Hypernephroma [facility]) 2004   Dr Rosana Hoes  . Hypertension     Patient Active Problem List   Diagnosis Date Noted  . Viral URI with cough 09/12/2016  . Bilateral lumbar radiculopathy 07/08/2014  . Diabetes mellitus with peripheral vascular disease ([place]) 12/06/2013  . Abnormal nuclear stress test, 11/10/13 11/16/2013  . Adenomatous polyp 04/06/2013  . Unstable angina ([place]) 11/02/2011  . HYPERNEPHROMA 10/11/2009  . Elevated lipids 10/11/2009  . Essential hypertension, benign 10/11/2009  . Coronary atherosclerosis of native coronary artery, 11/15/13 PTCA/DES prox LAD, [place] 10/11/2009  . GERD 10/11/2009    Past Surgical History:  Procedure Laterality Date  . APPENDECTOMY    . CARDIAC CATHETERIZATION  11/01/2011   Dr Johnsie Cancel  . CARDIAC CATHETERIZATION N/A 03/16/2015   Procedure: Left Heart Cath and Cors/Grafts Angiography;  Surgeon: Josue Hector, MD;  Location: [place] CV LAB;  Service: Cardiovascular;  Laterality: N/A;  . CERVICAL LAMINECTOMY     x 1  . colonoscopy with polypectomy  2014   Dr Paulita Fujita  . colonscopy  2006   Dr. Delfin Edis; negative  . CORONARY ANGIOPLASTY  WITH STENT PLACEMENT  1990's; 11/15/2013   "today's make a total of 3" (11/15/2013)  . CORONARY ANGIOPLASTY WITH STENT PLACEMENT  11/15/13   Promus to prox. LAD  . CORONARY ARTERY BYPASS GRAFT  2000   Dr. Roxan Hockey 2001  . KNEE SURGERY  3-4 surgeries   bone moved from hip to under knee cap  . LEFT HEART CATHETERIZATION WITH CORONARY/GRAFT ANGIOGRAM  11/01/2011   Procedure: LEFT HEART CATHETERIZATION WITH Beatrix Fetters;  Surgeon: Josue Hector, MD;  Location: [facility] CATH LAB;  Service: Cardiovascular;;  . LEFT HEART CATHETERIZATION WITH CORONARY/GRAFT ANGIOGRAM N/A 11/15/2013   Procedure: LEFT HEART CATHETERIZATION WITH Beatrix Fetters;  Surgeon: Burnell Blanks, MD;  Location: [facility] CATH LAB;  Service: Cardiovascular;  Laterality: N/A;  . LUMBAR FUSION  06/08/2013   [place]  . LUMBAR LAMINECTOMY     x 4  . 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 TONSILLECTOMY         Home Medications    Prior to Admission medications   Medication Sig Start Date End Date Taking? Authorizing Provider  aspirin 325 MG tablet Take 325 mg by mouth daily.   Yes Historical Provider, MD  atorvastatin (LIPITOR) 80 MG tablet Take 1 tablet (80 mg total) by mouth at bedtime. 10/18/16  Yes Josue Hector, MD  losartan (COZAAR) 25 MG tablet Take 25 mg by mouth daily.   Yes Historical Provider, MD  losartan-hydrochlorothiazide (HYZAAR) 50-12.5 MG tablet Take 1 tablet by mouth daily. 10/18/16  Yes Josue Hector, MD  metoprolol succinate (TOPROL XL) 25 MG 24 hr tablet Take 1 tablet (25 mg total) by mouth daily. Take with metoprolol 50 mg 10/18/16  Yes Josue Hector, MD  metoprolol succinate (TOPROL-XL) 50 MG 24 hr tablet Take 1 tablet (50 mg total) by mouth daily. Take with Metoprolol 25 mg tablet 10/18/16  Yes Josue Hector, MD  Multiple Vitamin (MULTIVITAMIN) tablet Take 1 tablet by mouth daily.     Yes Historical Provider, MD  nitroGLYCERIN (NITROSTAT) 0.4 MG SL tablet Place 1 tablet (0.4 mg  total) under the tongue every 5 (five) minutes as needed for chest pain. 03/15/15  Yes Josue Hector, MD  Omega-3 Fatty Acids (FISH OIL PO) Take 1 capsule by mouth daily. Mega Red brand   Yes Historical Provider, MD  HYDROcodone-acetaminophen (NORCO/VICODIN) 5-325 MG tablet Take 1 tablet by mouth every 6 (six) hours as needed. 11/18/16   Dorie Rank, MD  losartan-hydrochlorothiazide (HYZAAR) 50-12.5 MG tablet Take 1 tablet by mouth daily. Patient not taking: Reported on 11/18/2016 10/18/16   Josue Hector, MD    Family History Family History  Problem Relation Age of Onset  . Heart attack Father 60  . Diabetes Mother   . Coronary artery disease Mother   . Heart attack Brother 68  . Heart attack Brother     in his 60s  . Cancer Maternal Grandmother     uterine cancer  . Coronary artery disease Other     M & P aunts/uncles  . Stroke Neg Hx     Social History Social History  Substance Use Topics  . Smoking status: Former Smoker    Packs/day: 1.00    Years: 38.00    Types: Cigarettes    Quit date: 10/01/1996  . Smokeless tobacco: Never Used     Comment: smoked 1960-1998, up 1 ppd  . Alcohol use No     Allergies   Codeine and Tramadol   Review of Systems Review of Systems  All other systems reviewed and are negative.    Physical Exam Updated Vital Signs BP 125/67 (BP Location: Left Arm)   Pulse 97   Temp 98.4 F (36.9 C) (Oral)   Resp 18   SpO2 92%   Physical Exam  Constitutional: He appears well-developed and well-nourished. No distress.  HENT:  Head: Normocephalic and atraumatic.  Right Ear: External ear normal.  Left Ear: External ear normal.  Eyes: Conjunctivae are normal. Right eye exhibits no discharge. Left eye exhibits no discharge. No scleral icterus.  Neck: Muscular tenderness present. Decreased range of motion present. No tracheal deviation present.    Cardiovascular: Normal rate.   Pulmonary/Chest: Effort normal. No stridor. No respiratory distress.    Abdominal: He exhibits no distension.  Musculoskeletal: He exhibits no edema.  Neurological: He is alert. Cranial nerve deficit: no gross deficits.  Skin: Skin is warm and dry. No rash noted.  Psychiatric: He has a normal mood and affect.  Nursing note and vitals reviewed.    ED Treatments / Results  Labs (all labs ordered are listed, but only abnormal results are displayed) Labs Reviewed - No data to display  EKG  EKG Interpretation None       Radiology Dg Cervical Spine Complete  Result Date: 11/18/2016 CLINICAL DATA:  Ongoing neck pain for several weeks. No reported trauma. EXAM: CERVICAL SPINE - COMPLETE 4+ VIEW COMPARISON:  None. FINDINGS: No traumatic malalignment. There is straightening of normal lordosis. The pre odontoid space and prevertebral soft tissues are normal. Multilevel degenerative changes are seen with anterior osteophytes and loss of disc space particularly at C4-5 and C6-7. The neural foramina are patent. The lateral masses of C1 align with C2. The odontoid process is unremarkable. Chronic calcifications are seen in the neck. IMPRESSION: 1. Moderate degenerative changes in the cervical spine. 2. Chronic calcifications in the neck. 3. No other acute abnormalities. Electronically Signed   By: Dorise Bullion III M.D   On: 11/18/2016 16:12    Procedures Procedures (including critical care time)  Medications Ordered in ED Medications  HYDROmorphone (DILAUDID) injection 0.5 mg (0.5 mg Intravenous Given 11/18/16 1632)     Initial Impression / Assessment and Plan / ED Course  I have reviewed the triage vital signs and the nursing notes.  Pertinent labs & imaging results that were available during my care of the patient were reviewed by me and considered in my medical decision making (see chart for details).   Musculoskeletal neck pain.  No sx to suggest infection, acute dissection.  Sx improved with pain meds.  Pt plains to see his spine doctor and pain  management doctor (he sees them for low back issues)  Final Clinical Impressions(s) / ED Diagnoses   Final diagnoses:  Degenerative disc disease, cervical    New Prescriptions New Prescriptions   HYDROCODONE-ACETAMINOPHEN (NORCO/VICODIN) 5-325 MG TABLET    Take 1 tablet by mouth every 6 (six) hours as needed.     Dorie Rank, MD 11/18/16 [phone]

## 2016-11-18 NOTE — Discharge Instructions (Signed)
Follow up with your spine doctor as planned, take the hydrocodone as needed for severe pain, you can take over the counter medications like ibuprofen or naprosyn at the same time as the hydrocodone as needed

## 2016-11-20 DIAGNOSIS — M5412 Radiculopathy, cervical region: Secondary | ICD-10-CM | POA: Diagnosis not present

## 2016-11-20 DIAGNOSIS — I10 Essential (primary) hypertension: Secondary | ICD-10-CM | POA: Diagnosis not present

## 2016-11-20 DIAGNOSIS — M542 Cervicalgia: Secondary | ICD-10-CM | POA: Diagnosis not present

## 2016-11-22 ENCOUNTER — Inpatient Hospital Stay
Admission: EM | Admit: 2016-11-22 | Discharge: 2016-11-25 | DRG: 871 | Disposition: A | Discharge status: Home / Self Care | Payer: PPO | Attending: Specialist | Admitting: Specialist

## 2016-11-22 ENCOUNTER — Emergency Department: Payer: PPO

## 2016-11-22 ENCOUNTER — Encounter: Payer: Self-pay | Admitting: Emergency Medicine

## 2016-11-22 DIAGNOSIS — G8929 Other chronic pain: Secondary | ICD-10-CM | POA: Diagnosis not present

## 2016-11-22 DIAGNOSIS — I21A1 Myocardial infarction type 2: Secondary | ICD-10-CM

## 2016-11-22 DIAGNOSIS — M549 Dorsalgia, unspecified: Secondary | ICD-10-CM | POA: Diagnosis present

## 2016-11-22 DIAGNOSIS — Z955 Presence of coronary angioplasty implant and graft: Secondary | ICD-10-CM | POA: Diagnosis not present

## 2016-11-22 DIAGNOSIS — Z833 Family history of diabetes mellitus: Secondary | ICD-10-CM

## 2016-11-22 DIAGNOSIS — A419 Sepsis, unspecified organism: Principal | ICD-10-CM | POA: Diagnosis present

## 2016-11-22 DIAGNOSIS — M6281 Muscle weakness (generalized): Secondary | ICD-10-CM | POA: Diagnosis not present

## 2016-11-22 DIAGNOSIS — Z79899 Other long term (current) drug therapy: Secondary | ICD-10-CM | POA: Diagnosis not present

## 2016-11-22 DIAGNOSIS — I25708 Atherosclerosis of coronary artery bypass graft(s), unspecified, with other forms of angina pectoris: Secondary | ICD-10-CM | POA: Diagnosis not present

## 2016-11-22 DIAGNOSIS — Z7982 Long term (current) use of aspirin: Secondary | ICD-10-CM | POA: Diagnosis not present

## 2016-11-22 DIAGNOSIS — Z886 Allergy status to analgesic agent status: Secondary | ICD-10-CM | POA: Diagnosis not present

## 2016-11-22 DIAGNOSIS — Z8249 Family history of ischemic heart disease and other diseases of the circulatory system: Secondary | ICD-10-CM | POA: Diagnosis not present

## 2016-11-22 DIAGNOSIS — R269 Unspecified abnormalities of gait and mobility: Secondary | ICD-10-CM | POA: Diagnosis not present

## 2016-11-22 DIAGNOSIS — Z85528 Personal history of other malignant neoplasm of kidney: Secondary | ICD-10-CM

## 2016-11-22 DIAGNOSIS — I25118 Atherosclerotic heart disease of native coronary artery with other forms of angina pectoris: Secondary | ICD-10-CM | POA: Diagnosis present

## 2016-11-22 DIAGNOSIS — J61 Pneumoconiosis due to asbestos and other mineral fibers: Secondary | ICD-10-CM | POA: Diagnosis present

## 2016-11-22 DIAGNOSIS — Z66 Do not resuscitate: Secondary | ICD-10-CM | POA: Diagnosis present

## 2016-11-22 DIAGNOSIS — G629 Polyneuropathy, unspecified: Secondary | ICD-10-CM | POA: Diagnosis present

## 2016-11-22 DIAGNOSIS — R748 Abnormal levels of other serum enzymes: Secondary | ICD-10-CM | POA: Diagnosis not present

## 2016-11-22 DIAGNOSIS — Z905 Acquired absence of kidney: Secondary | ICD-10-CM

## 2016-11-22 DIAGNOSIS — Z87891 Personal history of nicotine dependence: Secondary | ICD-10-CM | POA: Diagnosis not present

## 2016-11-22 DIAGNOSIS — Z8049 Family history of malignant neoplasm of other genital organs: Secondary | ICD-10-CM

## 2016-11-22 DIAGNOSIS — K219 Gastro-esophageal reflux disease without esophagitis: Secondary | ICD-10-CM | POA: Diagnosis present

## 2016-11-22 DIAGNOSIS — I509 Heart failure, unspecified: Secondary | ICD-10-CM | POA: Diagnosis present

## 2016-11-22 DIAGNOSIS — J101 Influenza due to other identified influenza virus with other respiratory manifestations: Secondary | ICD-10-CM

## 2016-11-22 DIAGNOSIS — R000 Tachycardia, unspecified: Secondary | ICD-10-CM | POA: Diagnosis present

## 2016-11-22 DIAGNOSIS — Z888 Allergy status to other drugs, medicaments and biological substances status: Secondary | ICD-10-CM | POA: Diagnosis not present

## 2016-11-22 DIAGNOSIS — Z9049 Acquired absence of other specified parts of digestive tract: Secondary | ICD-10-CM | POA: Diagnosis not present

## 2016-11-22 DIAGNOSIS — J189 Pneumonia, unspecified organism: Secondary | ICD-10-CM | POA: Diagnosis not present

## 2016-11-22 DIAGNOSIS — I110 Hypertensive heart disease with heart failure: Secondary | ICD-10-CM | POA: Diagnosis not present

## 2016-11-22 DIAGNOSIS — J09X2 Influenza due to identified novel influenza A virus with other respiratory manifestations: Secondary | ICD-10-CM | POA: Diagnosis not present

## 2016-11-22 DIAGNOSIS — R0602 Shortness of breath: Secondary | ICD-10-CM | POA: Diagnosis not present

## 2016-11-22 DIAGNOSIS — E785 Hyperlipidemia, unspecified: Secondary | ICD-10-CM | POA: Diagnosis present

## 2016-11-22 DIAGNOSIS — J9601 Acute respiratory failure with hypoxia: Secondary | ICD-10-CM | POA: Diagnosis not present

## 2016-11-22 DIAGNOSIS — Z981 Arthrodesis status: Secondary | ICD-10-CM

## 2016-11-22 DIAGNOSIS — R079 Chest pain, unspecified: Secondary | ICD-10-CM | POA: Diagnosis not present

## 2016-11-22 DIAGNOSIS — I2510 Atherosclerotic heart disease of native coronary artery without angina pectoris: Secondary | ICD-10-CM | POA: Diagnosis not present

## 2016-11-22 DIAGNOSIS — I214 Non-ST elevation (NSTEMI) myocardial infarction: Secondary | ICD-10-CM | POA: Diagnosis not present

## 2016-11-22 DIAGNOSIS — J439 Emphysema, unspecified: Secondary | ICD-10-CM | POA: Diagnosis not present

## 2016-11-22 DIAGNOSIS — M48061 Spinal stenosis, lumbar region without neurogenic claudication: Secondary | ICD-10-CM | POA: Diagnosis not present

## 2016-11-22 DIAGNOSIS — Z9889 Other specified postprocedural states: Secondary | ICD-10-CM

## 2016-11-22 DIAGNOSIS — I248 Other forms of acute ischemic heart disease: Secondary | ICD-10-CM | POA: Diagnosis not present

## 2016-11-22 DIAGNOSIS — I2489 Other forms of acute ischemic heart disease: Secondary | ICD-10-CM

## 2016-11-22 LAB — BASIC METABOLIC PANEL
ANION GAP: 12 (ref 5–15)
BUN: 28 mg/dL — ABNORMAL HIGH (ref 6–20)
CO2: 33 mmol/L — ABNORMAL HIGH (ref 22–32)
Calcium: 12.4 mg/dL — ABNORMAL HIGH (ref 8.9–10.3)
Chloride: 94 mmol/L — ABNORMAL LOW (ref 101–111)
Creatinine, Ser: 1.59 mg/dL — ABNORMAL HIGH (ref 0.61–1.24)
GFR calc Af Amer: 48 mL/min — ABNORMAL LOW (ref >60)
GFR, EST NON AFRICAN AMERICAN: 41 mL/min — AB (ref >60)
GLUCOSE: 105 mg/dL — AB (ref 65–99)
POTASSIUM: 3.8 mmol/L (ref 3.5–5.1)
Sodium: 139 mmol/L (ref 135–145)

## 2016-11-22 LAB — CBC
HEMATOCRIT: 40.7 % (ref 40.0–52.0)
HEMOGLOBIN: 14.3 g/dL (ref 13.0–18.0)
MCH: 31.8 pg (ref 26.0–34.0)
MCHC: 35.1 g/dL (ref 32.0–36.0)
MCV: 90.6 fL (ref 80.0–100.0)
Platelets: 217 10*3/uL (ref 150–440)
RBC: 4.5 MIL/uL (ref 4.40–5.90)
RDW: 13.9 % (ref 11.5–14.5)
WBC: 14 10*3/uL — AB (ref 3.8–10.6)

## 2016-11-22 LAB — TROPONIN I
TROPONIN I: 0.26 ng/mL — AB (ref <0.03)
Troponin I: 0.27 ng/mL (ref <0.03)
Troponin I: 0.23 ng/mL (ref <0.03)

## 2016-11-22 LAB — LACTIC ACID, PLASMA: LACTIC ACID, VENOUS: 2.2 mmol/L — AB (ref 0.5–1.9)

## 2016-11-22 LAB — INFLUENZA PANEL BY PCR (TYPE A & B)
INFLAPCR: POSITIVE — AB
INFLBPCR: NEGATIVE

## 2016-11-22 LAB — BRAIN NATRIURETIC PEPTIDE: B NATRIURETIC PEPTIDE 5: 196 pg/mL — AB (ref 0.0–100.0)

## 2016-11-22 MED ORDER — OSELTAMIVIR PHOSPHATE 30 MG PO CAPS
30.0000 mg | ORAL_CAPSULE | Freq: Once | ORAL | Status: AC
Start: 2016-11-22 — End: 2016-11-22
  Administered 2016-11-22: 30 mg via ORAL
  Filled 2016-11-22: qty 1

## 2016-11-22 MED ORDER — AZITHROMYCIN 500 MG PO TABS
500.0000 mg | ORAL_TABLET | Freq: Once | ORAL | Status: AC
  Administered 2016-11-22: 500 mg via ORAL
  Filled 2016-11-22: qty 1

## 2016-11-22 MED ORDER — ATORVASTATIN CALCIUM 20 MG PO TABS
80.0000 mg | ORAL_TABLET | Freq: Every day | ORAL | Status: DC
  Administered 2016-11-22 – 2016-11-24 (×3): 80 mg via ORAL
  Filled 2016-11-22 (×3): qty 4

## 2016-11-22 MED ORDER — ACETAMINOPHEN 650 MG RE SUPP: 650.0000 mg | Freq: Four times a day (QID) | RECTAL | Status: DC | PRN

## 2016-11-22 MED ORDER — ASPIRIN 81 MG PO CHEW
241.0000 mg | CHEWABLE_TABLET | Freq: Once | ORAL | Status: AC
  Administered 2016-11-22: 241 mg via ORAL
  Filled 2016-11-22: qty 3

## 2016-11-22 MED ORDER — ACETAMINOPHEN 325 MG PO TABS
650.0000 mg | ORAL_TABLET | Freq: Four times a day (QID) | ORAL | Status: DC | PRN
  Administered 2016-11-22 – 2016-11-25 (×3): 650 mg via ORAL
  Filled 2016-11-22 (×3): qty 2

## 2016-11-22 MED ORDER — ASPIRIN 325 MG PO TABS
325.0000 mg | ORAL_TABLET | Freq: Every day | ORAL | Status: DC
  Administered 2016-11-23 – 2016-11-25 (×3): 325 mg via ORAL
  Filled 2016-11-22 (×3): qty 1

## 2016-11-22 MED ORDER — ADULT MULTIVITAMIN W/MINERALS CH
1.0000 | ORAL_TABLET | Freq: Every day | ORAL | Status: DC
  Administered 2016-11-22 – 2016-11-25 (×4): 1 via ORAL
  Filled 2016-11-22 (×4): qty 1

## 2016-11-22 MED ORDER — ONDANSETRON HCL 4 MG PO TABS: 4.0000 mg | ORAL_TABLET | Freq: Four times a day (QID) | ORAL | Status: DC | PRN

## 2016-11-22 MED ORDER — HYDROCHLOROTHIAZIDE 12.5 MG PO CAPS
12.5000 mg | ORAL_CAPSULE | Freq: Every day | ORAL | Status: DC
  Administered 2016-11-23 – 2016-11-25 (×3): 12.5 mg via ORAL
  Filled 2016-11-22 (×3): qty 1

## 2016-11-22 MED ORDER — NITROGLYCERIN 0.4 MG SL SUBL: 0.4000 mg | SUBLINGUAL_TABLET | SUBLINGUAL | Status: DC | PRN

## 2016-11-22 MED ORDER — SODIUM CHLORIDE 0.9 % IV SOLN
INTRAVENOUS | Status: DC
  Administered 2016-11-22 – 2016-11-24 (×3): via INTRAVENOUS

## 2016-11-22 MED ORDER — HYDROCODONE-ACETAMINOPHEN 5-325 MG PO TABS
1.0000 | ORAL_TABLET | Freq: Four times a day (QID) | ORAL | Status: DC | PRN
  Administered 2016-11-22 – 2016-11-25 (×6): 1 via ORAL
  Filled 2016-11-22 (×6): qty 1

## 2016-11-22 MED ORDER — SENNOSIDES-DOCUSATE SODIUM 8.6-50 MG PO TABS: 1.0000 | ORAL_TABLET | Freq: Every evening | ORAL | Status: DC | PRN

## 2016-11-22 MED ORDER — METOPROLOL SUCCINATE ER 50 MG PO TB24
50.0000 mg | ORAL_TABLET | Freq: Every day | ORAL | Status: DC
  Administered 2016-11-23 – 2016-11-25 (×3): 50 mg via ORAL
  Filled 2016-11-22 (×3): qty 1

## 2016-11-22 MED ORDER — DEXTROSE 5 % IV SOLN: 1.0000 g | Freq: Once | INTRAVENOUS | Status: DC

## 2016-11-22 MED ORDER — GABAPENTIN 100 MG PO CAPS
100.0000 mg | ORAL_CAPSULE | Freq: Three times a day (TID) | ORAL | Status: DC
  Administered 2016-11-22 – 2016-11-25 (×9): 100 mg via ORAL
  Filled 2016-11-22 (×9): qty 1

## 2016-11-22 MED ORDER — LOSARTAN POTASSIUM-HCTZ 50-12.5 MG PO TABS: 1.0000 | ORAL_TABLET | Freq: Every day | ORAL | Status: DC

## 2016-11-22 MED ORDER — OSELTAMIVIR PHOSPHATE 30 MG PO CAPS
30.0000 mg | ORAL_CAPSULE | Freq: Two times a day (BID) | ORAL | Status: DC
  Administered 2016-11-22 – 2016-11-25 (×6): 30 mg via ORAL
  Filled 2016-11-22 (×7): qty 1

## 2016-11-22 MED ORDER — LOSARTAN POTASSIUM 50 MG PO TABS
50.0000 mg | ORAL_TABLET | Freq: Every day | ORAL | Status: DC
  Administered 2016-11-23 – 2016-11-25 (×3): 50 mg via ORAL
  Filled 2016-11-22 (×3): qty 1

## 2016-11-22 MED ORDER — CEFTRIAXONE SODIUM-DEXTROSE 1-3.74 GM-% IV SOLR
1.0000 g | Freq: Once | INTRAVENOUS | Status: AC
  Administered 2016-11-22: 1 g via INTRAVENOUS
  Filled 2016-11-22: qty 50

## 2016-11-22 MED ORDER — SODIUM CHLORIDE 0.9 % IV BOLUS (SEPSIS)
500.0000 mL | Freq: Once | INTRAVENOUS | Status: AC
  Administered 2016-11-22: 500 mL via INTRAVENOUS

## 2016-11-22 MED ORDER — ENOXAPARIN SODIUM 40 MG/0.4ML ~~LOC~~ SOLN
40.0000 mg | SUBCUTANEOUS | Status: DC
  Administered 2016-11-22 – 2016-11-24 (×3): 40 mg via SUBCUTANEOUS
  Filled 2016-11-22 (×3): qty 0.4

## 2016-11-22 MED ORDER — ONDANSETRON HCL 4 MG/2ML IJ SOLN: 4.0000 mg | Freq: Four times a day (QID) | INTRAMUSCULAR | Status: DC | PRN

## 2016-11-22 MED ORDER — ROSUVASTATIN CALCIUM 20 MG PO TABS: 20.0000 mg | ORAL_TABLET | Freq: Every day | ORAL | Status: DC

## 2016-11-22 NOTE — Consult Note (Signed)
Cardiology Consultation Note  Patient ID: Curtis Galloway, MRN: [id number], DOB/AGE: 1941/03/29 76 y.o. Admit date: 11/22/2016   Date of Consult: 11/22/2016 Primary Physician: Binnie Rail, MD Primary Cardiologist: Nishan, [place]  Chief Complaint: Shortness of breath, malaise Reason for Consult: Elevated troponin, shortness of breath, known coronary artery disease Physician requesting consult: Dr. Benjie Karvonen   HPI: 76 y.o. male with a known history of CAD status post CABG with catheterization 2013 showing occluded grafts and coronary stent to the LAD, chronic back pain and hypertension who presented to the hospital with generalized weakness and shortness of breath. Diagnosed with influenza Cardiology consult for elevated troponin 0.27 with repeat 0.26  He reports increasing shortness of breath, chills, fever 5 days Also has some cough, general malaise, anorexia In the emergency room was hypoxic, Concern for pneumonia started on antibiotics Tested positive for influenza Denies having significant chest pain Only concern is chronic back pain, general malaise  Previous cardiac catheterizations in 2013, 2016  Cardiac history detailed below  1. 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  2. Unstable angina secondary to severe hazy stenosis in proximal LAD  3. Successful PTCA/DES x 1 proximal LAD  4. Preserved LV systolic function with akinesis of the inferior wall at the base   Grafts:  SVG: to PDA occluded New since 2003  SVG: Diagonal occluded New since 2003  [place]: Came off diagonal graft atretic in 2003  [place]: Known to be occluded cath 2003   Effient stopped l 2/16  Needs pain management for back   Cath June 2016 for recurrent symptoms no critical disease  Ost RCA lesion, 100% stenosed.  Ost LM to LM lesion, 30%  stenosed.  1st Mrg lesion, 40% stenosed.  Mid Cx to Dist Cx lesion, 50% stenosed.  1st Diag lesion, 80% stenosed.  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 Patent stent in proximal LAD with good left to right collaterals Moderate disease in circumflex /OM2 EF 55% inferobasal hypokinesis   Past Medical History:  Diagnosis Date  . Abnormal nuclear stress test 11/16/2013  . Coronary artery disease    Dr. Johnsie Cancel,  stent to LAD  . GERD (gastroesophageal reflux disease)   . Hyperlipidemia   . Hypernephroma [facility]) 2004   Dr Rosana Hoes  . Hypertension       Most Recent Cardiac Studies: Reason abdominal aortic ultrasound with no aneurysm  December 2016   Surgical History:  Past Surgical History:  Procedure Laterality Date  . APPENDECTOMY    . CARDIAC CATHETERIZATION  11/01/2011   Dr Johnsie Cancel  . CARDIAC CATHETERIZATION N/A 03/16/2015   Procedure: Left Heart Cath and Cors/Grafts Angiography;  Surgeon: Josue Hector, MD;  Location: [place] CV LAB;  Service: Cardiovascular;  Laterality: N/A;  . CERVICAL LAMINECTOMY     x 1  . colonoscopy with polypectomy  2014   Dr Paulita Fujita  . colonscopy  2006   Dr. Delfin Edis; negative  . CORONARY ANGIOPLASTY WITH STENT PLACEMENT  1990's; 11/15/2013   "today's make a total of 3" (11/15/2013)  . CORONARY ANGIOPLASTY WITH STENT PLACEMENT  11/15/13   Promus to prox. LAD  . CORONARY ARTERY BYPASS GRAFT  2000   Dr. Roxan Hockey 2001  . KNEE SURGERY  3-4 surgeries   bone moved from hip to under  knee cap  . LEFT HEART CATHETERIZATION WITH CORONARY/GRAFT ANGIOGRAM  11/01/2011   Procedure: LEFT HEART CATHETERIZATION WITH Beatrix Fetters;  Surgeon: Josue Hector, MD;  Location[facility] C[facility] LAB;  Service: Cardiovascular;;  . LEFT HEART CATHETERIZATION WITH CORONARY/GRAFT ANGIOGRAM N/A 11/15/2013   Procedure: LEFT HEART CATHETERIZATION WITH Beatrix Fetters;  Surgeon: Burnell Blanks, MD;   Location: [facility] CATH LAB;  Service: Cardiovascular;  Laterality: N/A;  . LUMBAR FUSION  06/08/2013   [place]  . LUMBAR LAMINECTOMY     x 4  . 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 TONSILLECTOMY       Home Meds: Prior to Admission medications   Medication Sig Start Date End Date Taking? Authorizing Provider  aspirin 325 MG tablet Take 325 mg by mouth daily.   Yes Historical Provider, MD  atorvastatin (LIPITOR) 80 MG tablet Take 1 tablet (80 mg total) by mouth at bedtime. 10/18/16  Yes Josue Hector, MD  gabapentin (NEURONTIN) 100 MG capsule Take 100 mg by mouth 3 (three) times daily. 11/20/16  Yes Historical Provider, MD  HYDROcodone-acetaminophen (NORCO/VICODIN) 5-325 MG tablet Take 1 tablet by mouth every 6 (six) hours as needed. 11/18/16  Yes Dorie Rank, MD  losartan (COZAAR) 25 MG tablet Take 25 mg by mouth daily.   Yes Historical Provider, MD  losartan-hydrochlorothiazide (HYZAAR) 50-12.5 MG tablet Take 1 tablet by mouth daily. 10/18/16  Yes Josue Hector, MD  metoprolol succinate (TOPROL XL) 25 MG 24 hr tablet Take 1 tablet (25 mg total) by mouth daily. Take with metoprolol 50 mg 10/18/16  Yes Josue Hector, MD  metoprolol succinate (TOPROL-XL) 50 MG 24 hr tablet Take 1 tablet (50 mg total) by mouth daily. Take with Metoprolol 25 mg tablet 10/18/16  Yes Josue Hector, MD  Multiple Vitamin (MULTIVITAMIN) tablet Take 1 tablet by mouth daily.     Yes Historical Provider, MD  nitroGLYCERIN (NITROSTAT) 0.4 MG SL tablet Place 1 tablet (0.4 mg total) under the tongue every 5 (five) minutes as needed for chest pain. 03/15/15  Yes Josue Hector, MD  Omega-3 Fatty Acids (FISH OIL PO) Take 1 capsule by mouth daily. Mega Red brand   Yes Historical Provider, MD    Inpatient Medications:  . [START ON 11/23/2016] aspirin  325 mg Oral Daily  . atorvastatin  80 mg Oral QHS  . enoxaparin (LOVENOX) injection  40 mg Subcutaneous Q24H  . gabapentin  100 mg Oral TID  . [START ON 11/23/2016] losartan  50 mg  Oral Daily   And  . [START ON 11/23/2016] hydrochlorothiazide  12.5 mg Oral Daily  . [START ON 11/23/2016] metoprolol succinate  50 mg Oral Daily  . multivitamin with minerals  1 tablet Oral Daily  . oseltamivir  30 mg Oral BID   . sodium chloride 75 mL/hr at 11/22/16 1758    Allergies:  Allergies  Allergen Reactions  . Codeine Nausea Only    severe  . Tramadol Nausea Only    severe    Social History   Social History  . Marital status: Married    Spouse name: N/A  . Number of children: N/A  . Years of education: N/A   Occupational History  . Not on file.   Social History Main Topics  . Smoking status: Former Smoker    Packs/day: 1.00    Years: 38.00    Types: Cigarettes    Quit date: 10/01/1996  . Smokeless tobacco: Never Used  Comment: smoked 1960-1998, up 1 ppd  . Alcohol use No  . Drug use: No  . Sexual activity: Yes   Other Topics Concern  . Not on file   Social History Narrative   Retired   Married   Former smoker   Alcohol use-no   Regular exercise-no     Family History  Problem Relation Age of Onset  . Heart attack Father 97  . Diabetes Mother   . Coronary artery disease Mother   . Heart attack Brother 69  . Heart attack Brother     in his 49s  . Cancer Maternal Grandmother     uterine cancer  . Coronary artery disease Other     M & P aunts/uncles  . Stroke Neg Hx      Review of Systems: Review of Systems  Constitutional: Positive for fever and malaise/fatigue.  Respiratory: Positive for cough.   Cardiovascular: Negative.   Gastrointestinal: Negative.   Musculoskeletal: Positive for back pain.  Neurological: Positive for weakness.  Psychiatric/Behavioral: Negative.   All other systems reviewed and are negative.   Labs: CBC  Recent Labs  11/22/16 1010  WBC 14.0*  HGB 14.3  HCT 40.7  MCV 90.6  PLT 419   Basic Metabolic Panel  Recent Labs  11/22/16 1010  NA 139  K 3.8  CL 94*  CO2 33*  GLUCOSE 105*  BUN 28*   CREATININE 1.59*  CALCIUM 12.4*   Liver Function Tests No results for input(s): AST, ALT, ALKPHOS, BILITOT, PROT, ALBUMIN in the last 72 hours. No results for input(s): LIPASE, AMYLASE in the last 72 hours. Cardiac Enzymes  Recent Labs  11/22/16 1010 11/22/16 1426  TROPONINI 0.27* 0.26*   BNP Invalid input(s): POCBNP D-Dimer No results for input(s): DDIMER in the last 72 hours. Hemoglobin A1C No results for input(s): HGBA1C in the last 72 hours. Fasting Lipid Panel No results for input(s): CHOL, HDL, LDLCALC, TRIG, CHOLHDL, LDLDIRECT in the last 72 hours. Thyroid Function Tests No results for input(s): TSH, T4TOTAL, T3FREE, THYROIDAB in the last 72 hours.  Invalid input(s): FREET3  Radiology/Studies:  Dg Chest 2 View  Result Date: 11/22/2016 CLINICAL DATA:  Shortness of breath.  Chest pain . EXAM: CHEST  2 VIEW COMPARISON:  08/28/2015 FINDINGS: Prior CABG. Heart size normal. No pulmonary venous congestion. Bilateral pulmonary interstitial prominence noted, particularly prominent on the left. Pneumonitis could present in this fashion. Interstitial edema could present in this fashion. 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 Similar findings noted on prior exam. No pneumothorax. No acute bony abnormality. IMPRESSION: 1. Bilateral pulmonary interstitial prominence, left side greater right. Bilateral pneumonitis and/or interstitial edema could present in this fashion. 2. Bilateral pleural thickening with calcified pleural plaques again noted. 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 3. Prior CABG.  Heart size stable. Electronically Signed   By: Marcello Moores  Register   On: 11/22/2016 10:41   Dg Cervical Spine Complete  Result Date: 11/18/2016 CLINICAL DATA:  Ongoing neck pain for several weeks. No reported trauma. EXAM: CERVICAL SPINE - COMPLETE 4+ VIEW COMPARISON:  None. FINDINGS: No traumatic malalignment. There is  straightening of normal lordosis. The pre odontoid space and prevertebral soft tissues are normal. Multilevel degenerative changes are seen with anterior osteophytes and loss of disc space particularly at C4-5 and C6-7. The neural foramina are patent. The lateral masses of C1 align with C2. The odontoid process is unremarkable. Chronic calcifications are seen in the neck. IMPRESSION: 1. Moderate  degenerative changes in the cervical spine. 2. Chronic calcifications in the neck. 3. No other acute abnormalities. Electronically Signed   By: Dorise Bullion III M.D   On: 11/18/2016 16:12    EKG: EKG shows sinus tachycardia rate 120 bpm ST abnormality anterolateral leads concerning for ischemia , slightly more pronounced compared to EKG from June 2017, possibly rate related   EKG lab work, chest x-ray, echocardiogram reviewed independently by myself  Weights: Filed Weights   11/22/16 1009 11/22/16 1631  Weight: 162 lb (73.5 kg) 158 lb 8 oz (71.9 kg)     Physical Exam: Telemetry showing sinus tachycardia Blood pressure (!) 177/63, pulse (!) 107, temperature 98.8 F (37.1 C), temperature source Oral, resp. rate (!) 21, weight 158 lb 8 oz (71.9 kg), SpO2 97 %. Body mass index is 23.41 kg/m. GEN: Well nourished, well developed, in no acute distress.  HEENT: Grossly normal.  Neck: Supple, no JVD, carotid bruits, or masses. Cardiac: RRR, no murmurs, rubs, or gallops. No clubbing, cyanosis, edema.  Radials/ 2+ and equal bilaterally. Decreased lower extremity arterial pulses Respiratory:  Respirations regular and unlabored, scattered rales GI: Soft, nontender, nondistended, BS + x 4. MS: no deformity or atrophy. Skin: warm and dry, no rash. Neuro:  Strength and sensation are intact. Psych: AAOx3.  Normal affect.    Assessment and Plan:   1. Elevated troponin   demand ischemia in the setting of respiratory distress, hypoxia, influenza positive   known severe underlying coronary artery disease    would continue to monitor cardiac enzymes 1 more Currently without symptoms of chest discomfort concerning for angina Tachycardia likely secondary to underlying influenza and respiratory distress No plan for additional cardiac workup at this time unless symptoms change Would continue aspirin, Lipitor, metoprolol  2) respiratory distress  influenza a positive Started on Tamiflu Appears comfortable on exam this evening Tachycardia mildly improved,  He is on oxygen by nasal cannula Blood pressure stable, does not appear to have sepsis Would be cautious with IV fluids, encourage by mouth fluid intake  3) CAD/CABG Severe underlying coronary disease Occluded grafts by prior cardiac catheterization 2 years ago No plans for ischemia workup at this time Prior ejection fraction 55% by catheterization June 2016 No prior echocardiogram on record   Total encounter time more than 110 minutes  Greater than 50% was spent in counseling and coordination of care with the patient   Signed, Esmond Plants, MD, Ph.D [facility] 11/22/2016

## 2016-11-22 NOTE — Progress Notes (Signed)
ANTIBIOTIC CONSULT NOTE - INITIAL  Pharmacy Consult for Tamiflu  Indication: flu  Allergies  Allergen Reactions  . Codeine Nausea Only    severe  . Tramadol Nausea Only    severe    Patient Measurements: Weight: 158 lb 8 oz (71.9 kg) Adjusted Body Weight:   Vital Signs: Temp: 98.8 F (37.1 C) (02/23 1631) Temp Source: Oral (02/23 1631) BP: 177/63 (02/23 1631) Pulse Rate: 107 (02/23 1631) Intake/Output from previous day: No intake/output data recorded. Intake/Output from this shift: No intake/output data recorded.  Labs:  Recent Labs  11/22/16 1010  WBC 14.0*  HGB 14.3  PLT 217  CREATININE 1.59*   Estimated Creatinine Clearance: 40.8 mL/min (by C-G formula based on SCr of 1.59 mg/dL (H)). No results for input(s): VANCOTROUGH, VANCOPEAK, VANCORANDOM, GENTTROUGH, GENTPEAK, GENTRANDOM, TOBRATROUGH, TOBRAPEAK, TOBRARND, AMIKACINPEAK, AMIKACINTROU, AMIKACIN in the last 72 hours.   Microbiology: No results found for this or any previous visit (from the past 720 hour(s)).  Medical History: Past Medical History:  Diagnosis Date  . Abnormal nuclear stress test 11/16/2013  . Coronary artery disease    Dr. Johnsie Cancel,  stent to LAD  . GERD (gastroesophageal reflux disease)   . Hyperlipidemia   . Hypernephroma [facility]) 2004   Dr Rosana Hoes  . Hypertension     Medications:  Scheduled:  . [START ON 11/23/2016] aspirin  325 mg Oral Daily  . atorvastatin  80 mg Oral QHS  . enoxaparin (LOVENOX) injection  40 mg Subcutaneous Q24H  . gabapentin  100 mg Oral TID  . [START ON 11/23/2016] metoprolol succinate  50 mg Oral Daily  . multivitamin with minerals  1 tablet Oral Daily  . oseltamivir  30 mg Oral BID   Assessment: CrCl = 40.8 ml/min   Goal of Therapy:  resolution of infection  Plan:  Expected duration 7 days with resolution of temperature and/or normalization of WBC   Tamiflu 75 mg PO BID originally ordered.  Will adjust dose to tamiflu 30 mg PO BID to continue 2/23 @  23:00.   Kimbra Marcelino D 11/22/2016,5:13 PM

## 2016-11-22 NOTE — ED Provider Notes (Addendum)
[facility] Emergency Department Provider Note  ____________________________________________  Time seen: Approximately 11:12 AM  I have reviewed the triage vital signs and the nursing notes.   HISTORY  Chief Complaint Shortness of Breath   HPI Curtis Galloway is a 76 y.o. male h/o CAD s/p CABG and stents, HTN, HLD who presents for evaluation of SOB. Patient has had one week of progressively worsening SOB, cough productive of white sputum, chills, low grade fever, generalized weakness/malaise, and decreased appetite. Patient has been compliant with all of his medications. Patient reports that his shortness of breath is present at rest and markedly improved with minimal exertion. He denies weight gain, pedal edema, or worsening orthopnea than his baseline. His last echocardiogram was done in 2016 showing an EF of 55%. Patient has known chronic CAD. He denies any chest pain, nausea, vomiting, diarrhea, abdominal pain. Patient currently complaining of severe shortness of breath. He does not use oxygen at home. He has a remote history of smoking but no history of COPD or asthma. Does not use inhalers at home. Patient also has a history of exposure to assess those.  Past Medical History:  Diagnosis Date  . Abnormal nuclear stress test 11/16/2013  . Coronary artery disease    Dr. Johnsie Cancel,  stent to LAD  . GERD (gastroesophageal reflux disease)   . Hyperlipidemia   . Hypernephroma [facility]) 2004   Dr Rosana Hoes  . Hypertension     Patient Active Problem List   Diagnosis Date Noted  . Viral URI with cough 09/12/2016  . Bilateral lumbar radiculopathy 07/08/2014  . Diabetes mellitus with peripheral vascular disease ([place]) 12/06/2013  . Abnormal nuclear stress test, 11/10/13 11/16/2013  . Adenomatous polyp 04/06/2013  . Unstable angina ([place]) 11/02/2011  . HYPERNEPHROMA 10/11/2009  . Elevated lipids 10/11/2009  . Essential hypertension, benign 10/11/2009  . Coronary  atherosclerosis of native coronary artery, 11/15/13 PTCA/DES prox LAD, Promus 10/11/2009  . GERD 10/11/2009    Past Surgical History:  Procedure Laterality Date  . APPENDECTOMY    . CARDIAC CATHETERIZATION  11/01/2011   Dr Johnsie Cancel  . CARDIAC CATHETERIZATION N/A 03/16/2015   Procedure: Left Heart Cath and Cors/Grafts Angiography;  Surgeon: Josue Hector, MD;  Location: [place] CV LAB;  Service: Cardiovascular;  Laterality: N/A;  . CERVICAL LAMINECTOMY     x 1  . colonoscopy with polypectomy  2014   Dr Paulita Fujita  . colonscopy  2006   Dr. Delfin Edis; negative  . CORONARY ANGIOPLASTY WITH STENT PLACEMENT  1990's; 11/15/2013   "today's make a total of 3" (11/15/2013)  . CORONARY ANGIOPLASTY WITH STENT PLACEMENT  11/15/13   Promus to prox. LAD  . CORONARY ARTERY BYPASS GRAFT  2000   Dr. Roxan Hockey 2001  . KNEE SURGERY  3-4 surgeries   bone moved from hip to under knee cap  . LEFT HEART CATHETERIZATION WITH CORONARY/GRAFT ANGIOGRAM  11/01/2011   Procedure: LEFT HEART CATHETERIZATION WITH Beatrix Fetters;  Surgeon: Josue Hector, MD;  Location: [facility] CATH LAB;  Service: Cardiovascular;;  . LEFT HEART CATHETERIZATION WITH CORONARY/GRAFT ANGIOGRAM N/A 11/15/2013   Procedure: LEFT HEART CATHETERIZATION WITH Beatrix Fetters;  Surgeon: Burnell Blanks, MD;  Location: [facility] CATH LAB;  Service: Cardiovascular;  Laterality: N/A;  . LUMBAR FUSION  06/08/2013   [place]  . LUMBAR LAMINECTOMY     x 4  . 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 TONSILLECTOMY      Prior to Admission medications  Medication Sig Start Date End Date Taking? Authorizing Provider  aspirin 325 MG tablet Take 325 mg by mouth daily.   Yes Historical Provider, MD  atorvastatin (LIPITOR) 80 MG tablet Take 1 tablet (80 mg total) by mouth at bedtime. 10/18/16  Yes Josue Hector, MD  gabapentin (NEURONTIN) 100 MG capsule Take 100 mg by mouth 3 (three) times daily. 11/20/16  Yes Historical Provider, MD    HYDROcodone-acetaminophen (NORCO/VICODIN) 5-325 MG tablet Take 1 tablet by mouth every 6 (six) hours as needed. 11/18/16  Yes Dorie Rank, MD  losartan (COZAAR) 25 MG tablet Take 25 mg by mouth daily.   Yes Historical Provider, MD  losartan-hydrochlorothiazide (HYZAAR) 50-12.5 MG tablet Take 1 tablet by mouth daily. 10/18/16  Yes Josue Hector, MD  metoprolol succinate (TOPROL XL) 25 MG 24 hr tablet Take 1 tablet (25 mg total) by mouth daily. Take with metoprolol 50 mg 10/18/16  Yes Josue Hector, MD  metoprolol succinate (TOPROL-XL) 50 MG 24 hr tablet Take 1 tablet (50 mg total) by mouth daily. Take with Metoprolol 25 mg tablet 10/18/16  Yes Josue Hector, MD  Multiple Vitamin (MULTIVITAMIN) tablet Take 1 tablet by mouth daily.     Yes Historical Provider, MD  nitroGLYCERIN (NITROSTAT) 0.4 MG SL tablet Place 1 tablet (0.4 mg total) under the tongue every 5 (five) minutes as needed for chest pain. 03/15/15  Yes Josue Hector, MD  Omega-3 Fatty Acids (FISH OIL PO) Take 1 capsule by mouth daily. Mega Red brand   Yes Historical Provider, MD    Allergies Codeine and Tramadol  Family History  Problem Relation Age of Onset  . Heart attack Father 19  . Diabetes Mother   . Coronary artery disease Mother   . Heart attack Brother 40  . Heart attack Brother     in his 67s  . Cancer Maternal Grandmother     uterine cancer  . Coronary artery disease Other     M & P aunts/uncles  . Stroke Neg Hx     Social History Social History  Substance Use Topics  . Smoking status: Former Smoker    Packs/day: 1.00    Years: 38.00    Types: Cigarettes    Quit date: 10/01/1996  . Smokeless tobacco: Never Used     Comment: smoked 1960-1998, up 1 ppd  . Alcohol use No    Review of Systems  Constitutional: + low grade fever, chills, malaise, body aches Eyes: Negative for visual changes. ENT: Negative for sore throat. Neck: No neck pain  Cardiovascular: Negative for chest pain. Respiratory: + shortness  of breath, and cough Gastrointestinal: Negative for abdominal pain, vomiting or diarrhea. Genitourinary: Negative for dysuria. Musculoskeletal: Negative for back pain. Skin: Negative for rash. Neurological: Negative for headaches, weakness or numbness. Psych: No SI or HI  ____________________________________________   PHYSICAL EXAM:  VITAL SIGNS: ED Triage Vitals  Enc Vitals Group     BP 11/22/16 1008 125/80     Pulse Rate 11/22/16 1008 (!) 120     Resp 11/22/16 1008 (!) 24     Temp 11/22/16 1008 97.5 F (36.4 C)     Temp Source 11/22/16 1008 Oral     SpO2 11/22/16 1008 95 %     Weight 11/22/16 1009 162 lb (73.5 kg)     Height --      Head Circumference --      Peak Flow --      Pain Score 11/22/16 1009  8     Pain Loc --      Pain Edu? --      Excl. in [place]? --     Constitutional: Alert and oriented, ill appearing, mild respiratory distress.  HEENT:      Head: Normocephalic and atraumatic.         Eyes: Conjunctivae are normal. Sclera is non-icteric. EOMI. PERRL      Mouth/Throat: Mucous membranes are moist.       Neck: Supple with no signs of meningismus. Cardiovascular: Tachycardic with regular rhythm. No murmurs, gallops, or rubs. 2+ symmetrical distal pulses are present in all extremities. No JVD. Respiratory: Increased work of breathing, hypoxic on room air requiring 2 L nasal cannula, tachypnea, severely diminished air movement bilateral bases, no crackles or wheezes  Gastrointestinal: Soft, non tender, and non distended with positive bowel sounds. No rebound or guarding. Musculoskeletal: Nontender with normal range of motion in all extremities. No edema, cyanosis, or erythema of extremities. Neurologic: Normal speech and language. Face is symmetric. Moving all extremities. No gross focal neurologic deficits are appreciated. Skin: Skin is warm, dry and intact. No rash noted. Psychiatric: Mood and affect are normal. Speech and behavior are  normal.  ____________________________________________   LABS (all labs ordered are listed, but only abnormal results are displayed)  Labs Reviewed  BASIC METABOLIC PANEL - Abnormal; Notable for the following:       Result Value   Chloride 94 (*)    CO2 33 (*)    Glucose, Bld 105 (*)    BUN 28 (*)    Creatinine, Ser 1.59 (*)    Calcium 12.4 (*)    GFR calc non Af Amer 41 (*)    GFR calc Af Amer 48 (*)    All other components within normal limits  CBC - Abnormal; Notable for the following:    WBC 14.0 (*)    All other components within normal limits  TROPONIN I - Abnormal; Notable for the following:    Troponin I 0.27 (*)    All other components within normal limits  BRAIN NATRIURETIC PEPTIDE - Abnormal; Notable for the following:    B Natriuretic Peptide 196.0 (*)    All other components within normal limits  LACTIC ACID, PLASMA - Abnormal; Notable for the following:    Lactic Acid, Venous 2.2 (*)    All other components within normal limits  INFLUENZA PANEL BY PCR (TYPE A & B) - Abnormal; Notable for the following:    Influenza A By PCR POSITIVE (*)    All other components within normal limits  CULTURE, BLOOD (ROUTINE X 2)  CULTURE, BLOOD (ROUTINE X 2)   ____________________________________________  EKG  ED ECG REPORT I, Rudene Re, the attending physician, personally viewed and interpreted this ECG.   Sinus tachycardia, rate of 120, normal intervals, normal axis, ST elevation in aVR with diffuse ST depressions on the inferior lateral leads which are worse when compared to prior. No ST elevation.  ____________________________________________  RADIOLOGY  CXR:  1. Bilateral pulmonary interstitial prominence, left side greater right. Bilateral pneumonitis and/or interstitial edema could present in this fashion.  2. Bilateral pleural thickening with calcified pleural plaques again noted. 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  3. Prior CABG.  Heart size stable. ____________________________________________   PROCEDURES  Procedure(s) performed: None Procedures Critical Care performed: yes  CRITICAL CARE Performed by: Rudene Re  ?  Total critical care time: 35 min  Critical care time was exclusive of separately  billable procedures and treating other patients.  Critical care was necessary to treat or prevent imminent or life-threatening deterioration.  Critical care was time spent personally by me on the following activities: development of treatment plan with patient and/or surrogate as well as nursing, discussions with consultants, evaluation of patient's response to treatment, examination of patient, obtaining history from patient or surrogate, ordering and performing treatments and interventions, ordering and review of laboratory studies, ordering and review of radiographic studies, pulse oximetry and re-evaluation of patient's condition.  ____________________________________________   INITIAL IMPRESSION / ASSESSMENT AND PLAN / ED COURSE  76 y.o. male h/o CAD s/p CABG and stents, HTN, HLD who presents for evaluation of progressively worsening shortness of breath in the setting of low-grade fever, chills, body aches, cough productive of white sputum, malaise, and decreased appetite and energy. Patient with increased work of breathing, new oxygen requirement, diminished air movement bilaterally. Chest x-ray concerning for bilateral pneumonia. White count is 14. We'll start patient on IV ceftriaxone and by mouth azithromycin for community-acquired pneumonia. EKG showing worsening ST depressions and elevated troponin at 0.27 concerning for demand ischemia in the setting of tachycardia. We'll give IV fluids. Patient was started on 2 L nasal cannula. Patient was admitted to the hospitalist service. Flu swab is pending.   ED COURSE:  Patient positive for influenza A. Was started on Tamiflu reduced dose his kidney  function.Patient with leukocytosis of 14K, CXR concerning for PNA received ceftriaxone and azithromycin. Lactate 2.2, given IVF reduced amount due to heart failure and elevated BNP. Patient will be admitted to the hospitalist service.  Pertinent labs & imaging results that were available during my care of the patient were reviewed by me and considered in my medical decision making (see chart for details).    ____________________________________________   FINAL CLINICAL IMPRESSION(S) / ED DIAGNOSES  Final diagnoses:  Acute respiratory failure with hypoxia ([place])  Community acquired pneumonia, unspecified laterality  Non-ST elevation myocardial infarction (NSTEMI), type 2 ([place])  Influenza A      NEW MEDICATIONS STARTED DURING THIS VISIT:  New Prescriptions   No medications on file     Note:  This document was prepared using Dragon voice recognition software and may include unintentional dictation errors.    Rudene Re, MD 11/22/16 [place], MD 11/22/16 [phone]

## 2016-11-22 NOTE — Progress Notes (Signed)
Patient re thought his decision and wants to be  LIMITED CODE no MV Orders changed

## 2016-11-22 NOTE — H&P (Signed)
[place] [facility] [place] NAME: Curtis Galloway    MR#:  [id number]  DATE OF BIRTH:  19-Mar-1941  DATE OF ADMISSION:  11/22/2016  PRIMARY CARE PHYSICIAN: Binnie Rail, MD   REQUESTING/REFERRING PHYSICIAN: Dr. Alfred Levins  CHIEF COMPLAINT:   Shortness of breath HISTORY OF PRESENT ILLNESS:  Curtis Galloway  is a 76 y.o. male with a known history of CAD status post CABG and coronary stents and essential hypertension who presents with generalized weakness and shortness of breath. Patient reports over the past day he has had increasing shortness of breath, chills and possible fevers. He also has a cough. He denies weight gain or lower extremity edema. He denies PND or orthopnea. He was found to be hypoxic in the emergency room. Initially it was thought that he may have a pneumonia and was treated with ceftriaxone and azithromycin. Influenza testing is positive.  PAST MEDICAL HISTORY:   Past Medical History:  Diagnosis Date  . Abnormal nuclear stress test 11/16/2013  . Coronary artery disease    Dr. Johnsie Cancel,  stent to LAD  . GERD (gastroesophageal reflux disease)   . Hyperlipidemia   . Hypernephroma [facility]) 2004   Dr Rosana Hoes  . Hypertension     PAST SURGICAL HISTORY:   Past Surgical History:  Procedure Laterality Date  . APPENDECTOMY    . CARDIAC CATHETERIZATION  11/01/2011   Dr Johnsie Cancel  . CARDIAC CATHETERIZATION N/A 03/16/2015   Procedure: Left Heart Cath and Cors/Grafts Angiography;  Surgeon: Josue Hector, MD;  Location: [place] CV LAB;  Service: Cardiovascular;  Laterality: N/A;  . CERVICAL LAMINECTOMY     x 1  . colonoscopy with polypectomy  2014   Dr Paulita Fujita  . colonscopy  2006   Dr. Delfin Edis; negative  . CORONARY ANGIOPLASTY WITH STENT PLACEMENT  1990's; 11/15/2013   "today's make a total of 3" (11/15/2013)  . CORONARY ANGIOPLASTY WITH STENT PLACEMENT  11/15/13   Promus to prox. LAD  . CORONARY ARTERY BYPASS GRAFT  2000   Dr. Roxan Hockey 2001   . KNEE SURGERY  3-4 surgeries   bone moved from hip to under knee cap  . LEFT HEART CATHETERIZATION WITH CORONARY/GRAFT ANGIOGRAM  11/01/2011   Procedure: LEFT HEART CATHETERIZATION WITH Beatrix Fetters;  Surgeon: Josue Hector, MD;  Location: [facility] CATH LAB;  Service: Cardiovascular;;  . LEFT HEART CATHETERIZATION WITH CORONARY/GRAFT ANGIOGRAM N/A 11/15/2013   Procedure: LEFT HEART CATHETERIZATION WITH Beatrix Fetters;  Surgeon: Burnell Blanks, MD;  Location: [facility] CATH LAB;  Service: Cardiovascular;  Laterality: N/A;  . LUMBAR FUSION  06/08/2013   [place]  . LUMBAR LAMINECTOMY     x 4  . 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 TONSILLECTOMY      SOCIAL HISTORY:   Social History  Substance Use Topics  . Smoking status: Former Smoker    Packs/day: 1.00    Years: 38.00    Types: Cigarettes    Quit date: 10/01/1996  . Smokeless tobacco: Never Used     Comment: smoked 1960-1998, up 1 ppd  . Alcohol use No    FAMILY HISTORY:   Family History  Problem Relation Age of Onset  . Heart attack Father 24  . Diabetes Mother   . Coronary artery disease Mother   . Heart attack Brother 64  . Heart attack Brother     in his 101s  . Cancer Maternal Grandmother     uterine cancer  .  Coronary artery disease Other     M & P aunts/uncles  . Stroke Neg Hx     DRUG ALLERGIES:   Allergies  Allergen Reactions  . Codeine Nausea Only    severe  . Tramadol Nausea Only    severe    REVIEW OF SYSTEMS:   Review of Systems  Constitutional: Positive for chills and malaise/fatigue. Negative for fever.  HENT: Negative.  Negative for ear discharge, ear pain, hearing loss, nosebleeds and sore throat.   Eyes: Negative.  Negative for blurred vision and pain.  Respiratory: Positive for cough and shortness of breath. Negative for hemoptysis and wheezing.   Cardiovascular: Negative.  Negative for chest pain, palpitations and leg swelling.  Gastrointestinal: Negative.  Negative  for abdominal pain, blood in stool, diarrhea, nausea and vomiting.  Genitourinary: Negative.  Negative for dysuria.  Musculoskeletal: Negative.  Negative for back pain.  Skin: Negative.   Neurological: Positive for weakness. Negative for dizziness, tremors, speech change, focal weakness, seizures and headaches.  Endo/Heme/Allergies: Negative.  Does not bruise/bleed easily.  Psychiatric/Behavioral: Negative.  Negative for depression, hallucinations and suicidal ideas.    MEDICATIONS AT HOME:   Prior to Admission medications   Medication Sig Start Date End Date Taking? Authorizing Provider  aspirin 325 MG tablet Take 325 mg by mouth daily.   Yes Historical Provider, MD  atorvastatin (LIPITOR) 80 MG tablet Take 1 tablet (80 mg total) by mouth at bedtime. 10/18/16  Yes Josue Hector, MD  gabapentin (NEURONTIN) 100 MG capsule Take 100 mg by mouth 3 (three) times daily. 11/20/16  Yes Historical Provider, MD  HYDROcodone-acetaminophen (NORCO/VICODIN) 5-325 MG tablet Take 1 tablet by mouth every 6 (six) hours as needed. 11/18/16  Yes Dorie Rank, MD  losartan (COZAAR) 25 MG tablet Take 25 mg by mouth daily.   Yes Historical Provider, MD  losartan-hydrochlorothiazide (HYZAAR) 50-12.5 MG tablet Take 1 tablet by mouth daily. 10/18/16  Yes Josue Hector, MD  metoprolol succinate (TOPROL XL) 25 MG 24 hr tablet Take 1 tablet (25 mg total) by mouth daily. Take with metoprolol 50 mg 10/18/16  Yes Josue Hector, MD  metoprolol succinate (TOPROL-XL) 50 MG 24 hr tablet Take 1 tablet (50 mg total) by mouth daily. Take with Metoprolol 25 mg tablet 10/18/16  Yes Josue Hector, MD  Multiple Vitamin (MULTIVITAMIN) tablet Take 1 tablet by mouth daily.     Yes Historical Provider, MD  nitroGLYCERIN (NITROSTAT) 0.4 MG SL tablet Place 1 tablet (0.4 mg total) under the tongue every 5 (five) minutes as needed for chest pain. 03/15/15  Yes Josue Hector, MD  Omega-3 Fatty Acids (FISH OIL PO) Take 1 capsule by mouth daily. Mega  Red brand   Yes Historical Provider, MD      VITAL SIGNS:  Blood pressure 125/80, pulse (!) 120, temperature 97.5 F (36.4 C), temperature source Oral, resp. rate (!) 24, weight 73.5 kg (162 lb), SpO2 95 %.  PHYSICAL EXAMINATION:   Physical Exam  Constitutional: He is oriented to person, place, and time and well-developed, well-nourished, and in no distress. No distress.  HENT:  Head: Normocephalic.  Eyes: No scleral icterus.  Neck: Normal range of motion. Neck supple. No JVD present. No tracheal deviation present.  Cardiovascular: Normal rate, regular rhythm and normal heart sounds.  Exam reveals no gallop and no friction rub.   No murmur heard. Pulmonary/Chest: Effort normal and breath sounds normal. No respiratory distress. He has no wheezes. He has no rales. He  exhibits no tenderness.  Abdominal: Soft. Bowel sounds are normal. He exhibits no distension and no mass. There is no tenderness. There is no rebound and no guarding.  Musculoskeletal: Normal range of motion. He exhibits no edema.  Neurological: He is alert and oriented to person, place, and time.  Skin: Skin is warm. No rash noted. No erythema.  Psychiatric: Affect and judgment normal.      LABORATORY PANEL:   CBC  Recent Labs Lab 11/22/16 1010  WBC 14.0*  HGB 14.3  HCT 40.7  PLT 217   ------------------------------------------------------------------------------------------------------------------  Chemistries   Recent Labs Lab 11/22/16 1010  NA 139  K 3.8  CL 94*  CO2 33*  GLUCOSE 105*  BUN 28*  CREATININE 1.59*  CALCIUM 12.4*   ------------------------------------------------------------------------------------------------------------------  Cardiac Enzymes  Recent Labs Lab 11/22/16 1010  TROPONINI 0.27*   ------------------------------------------------------------------------------------------------------------------  RADIOLOGY:  Dg Chest 2 View  Result Date: 11/22/2016 CLINICAL  DATA:  Shortness of breath.  Chest pain . EXAM: CHEST  2 VIEW COMPARISON:  08/28/2015 FINDINGS: Prior CABG. Heart size normal. No pulmonary venous congestion. Bilateral pulmonary interstitial prominence noted, particularly prominent on the left. Pneumonitis could present in this fashion. Interstitial edema could present in this fashion. 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 Similar findings noted on prior exam. No pneumothorax. No acute bony abnormality. IMPRESSION: 1. Bilateral pulmonary interstitial prominence, left side greater right. Bilateral pneumonitis and/or interstitial edema could present in this fashion. 2. Bilateral pleural thickening with calcified pleural plaques again noted. 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 3. Prior CABG.  Heart size stable. Electronically Signed   By: Marcello Moores  Register   On: 11/22/2016 10:41    EKG:   Sinus tachycardia heart rate 120 without ST elevation  ST depressions in the inferior leads  IMPRESSION AND PLAN:   76 year old male with a history of CAD/CABG who presents with shortness of breath and found to have positive influenza A testing.  1. Sepsis with tachycardia and tachypnea as well as leukocytosis from influenza A. X-ray showing pneumonitis consistent with influenza pneumonitis Continue IV fluids  2. Influenza A: Continue Tamiflu for 5 days  3.hypoxic respiratory failure in the setting of influenza pneumonitis Wean oxygen as tolerated  4. CAD: Continue aspirin, atorvastatin, losartan/HCTZ and metoprolol s5. Essential hypertension: Continue metoprolol and Hyzaar Monitor blood pressure  6. Elevated troponin may be due to demand ischemia Follow troponins Cardiology consult   All the records are reviewed and case discussed with ED provider. Management plans discussed with the patient and he is in agreement  CODE STATUS: DNR  TOTAL TIME TAKING CARE OF THIS PATIENT: 45  minutes.    Clotilde Loth M.D on 11/22/2016 at 2:13 PM  Between 7am to 6pm - Pager - [phone]  After 6pm go to [email or web address] - password [email or web address]  Office  [phone]  CC: Primary care physician; Binnie Rail, MD

## 2016-11-22 NOTE — Progress Notes (Signed)
Family Meeting Note  Advance Directive:no  Today a meeting took place with the Patient. spouse   The following clinical team members were present during this meeting:MD  The following were discussed:Patient's diagnosis sepsis  Influenza a Elevated troponins CAD: , Patient's progosis: > 12 months and Goals for treatment: DNR  Additional follow-up to be provided: advanced directives with chaplain  Time spent during discussion:17 minutes  [place]is [place], MD

## 2016-11-22 NOTE — ED Triage Notes (Addendum)
Pt with shortness of breath for over a week.  EMS came out to the house and advised to come to the hospital for further eval of PNE.  Pt noted to have shortness of breath.

## 2016-11-22 NOTE — ED Notes (Signed)
MD Veronese at bedside  

## 2016-11-22 NOTE — Progress Notes (Signed)
Patient requested an Advance Directives while he was still at emergency department. Since Chaplain was attending a patient who was at end of life Chaplain stayed with that family until the patient passed away. When Chaplain went to ED 14 patient was already transferred to 258. Chaplain went there and provided education and an advance directives.

## 2016-11-23 LAB — CBC
HEMATOCRIT: 33.9 % — AB (ref 40.0–52.0)
HEMOGLOBIN: 12.1 g/dL — AB (ref 13.0–18.0)
MCH: 32 pg (ref 26.0–34.0)
MCHC: 35.7 g/dL (ref 32.0–36.0)
MCV: 89.6 fL (ref 80.0–100.0)
Platelets: 177 10*3/uL (ref 150–440)
RBC: 3.79 MIL/uL — ABNORMAL LOW (ref 4.40–5.90)
RDW: 13.4 % (ref 11.5–14.5)
WBC: 8.4 10*3/uL (ref 3.8–10.6)

## 2016-11-23 LAB — BASIC METABOLIC PANEL
Anion gap: 7 (ref 5–15)
BUN: 28 mg/dL — AB (ref 6–20)
CALCIUM: 11 mg/dL — AB (ref 8.9–10.3)
CO2: 33 mmol/L — AB (ref 22–32)
Chloride: 99 mmol/L — ABNORMAL LOW (ref 101–111)
Creatinine, Ser: 1.36 mg/dL — ABNORMAL HIGH (ref 0.61–1.24)
GFR calc Af Amer: 58 mL/min — ABNORMAL LOW (ref >60)
GFR, EST NON AFRICAN AMERICAN: 50 mL/min — AB (ref >60)
GLUCOSE: 97 mg/dL (ref 65–99)
Potassium: 3.5 mmol/L (ref 3.5–5.1)
Sodium: 139 mmol/L (ref 135–145)

## 2016-11-23 LAB — TROPONIN I: Troponin I: 0.24 ng/mL (ref <0.03)

## 2016-11-23 MED ORDER — GUAIFENESIN 100 MG/5ML PO SOLN
5.0000 mL | ORAL | Status: DC | PRN
  Filled 2016-11-23 (×2): qty 5

## 2016-11-23 MED ORDER — IPRATROPIUM-ALBUTEROL 0.5-2.5 (3) MG/3ML IN SOLN: 3.0000 mL | Freq: Four times a day (QID) | RESPIRATORY_TRACT | Status: DC | PRN

## 2016-11-23 NOTE — Progress Notes (Signed)
[facility] [place] NAME: Curtis Galloway    MR#:  [id number]  DATE OF BIRTH:  [birth year]  SUBJECTIVE:   Pt. Here due to shortness of breath, weakness and noted to be + for Flu.  Still feels short of breath on minimal exertion.  + cough, but non-productive.  Feels weak.    REVIEW OF SYSTEMS:    Review of Systems  Constitutional: Negative for chills and fever.  HENT: Negative for congestion and tinnitus.   Eyes: Negative for blurred vision and double vision.  Respiratory: Positive for cough and shortness of breath. Negative for wheezing.   Cardiovascular: Negative for chest pain, orthopnea and PND.  Gastrointestinal: Negative for abdominal pain, diarrhea, nausea and vomiting.  Genitourinary: Negative for dysuria and hematuria.  Neurological: Positive for weakness. Negative for dizziness, sensory change and focal weakness.  All other systems reviewed and are negative.   Nutrition: Heart Healthy Tolerating Diet: Yes Tolerating PT: Await Eval.   DRUG ALLERGIES:   Allergies  Allergen Reactions  . Codeine Nausea Only    severe  . Tramadol Nausea Only    severe    VITALS:  Blood pressure 136/60, pulse 98, temperature 98.4 F (36.9 C), resp. rate 18, weight 71.9 kg (158 lb 8 oz), SpO2 98 %.  PHYSICAL EXAMINATION:   Physical Exam  GENERAL:  76 y.o.-year-old patient lying in the bed weak and in mild resp. Distress.   EYES: Pupils equal, round, reactive to light and accommodation. No scleral icterus. Extraocular muscles intact.  HEENT: Head atraumatic, normocephalic. Oropharynx and nasopharynx clear.  NECK:  Supple, no jugular venous distention. No thyroid enlargement, no tenderness.  LUNGS: Normal breath sounds bilaterally, minimal wheezing, rhonchi, No rales. No use of accessory muscles of respiration.  CARDIOVASCULAR: S1, S2 normal. No murmurs, rubs, or gallops.  ABDOMEN: Soft, nontender, nondistended. Bowel sounds present. No  organomegaly or mass.  EXTREMITIES: No cyanosis, clubbing or edema b/l.    NEUROLOGIC: Cranial nerves II through XII are intact. No focal Motor or sensory deficits b/l.  Globally weak. PSYCHIATRIC: The patient is alert and oriented x 3.  SKIN: No obvious rash, lesion, or ulcer.    LABORATORY PANEL:   CBC  Recent Labs Lab 11/23/16 0152  WBC 8.4  HGB 12.1*  HCT 33.9*  PLT 177   ------------------------------------------------------------------------------------------------------------------  Chemistries   Recent Labs Lab 11/23/16 0152  NA 139  K 3.5  CL 99*  CO2 33*  GLUCOSE 97  BUN 28*  CREATININE 1.36*  CALCIUM 11.0*   ------------------------------------------------------------------------------------------------------------------  Cardiac Enzymes  Recent Labs Lab 11/23/16 0152  TROPONINI 0.24*   ------------------------------------------------------------------------------------------------------------------  RADIOLOGY:  Dg Chest 2 View  Result Date: 11/22/2016 CLINICAL DATA:  Shortness of breath.  Chest pain . EXAM: CHEST  2 VIEW COMPARISON:  08/28/2015 FINDINGS: Prior CABG. Heart size normal. No pulmonary venous congestion. Bilateral pulmonary interstitial prominence noted, particularly prominent on the left. Pneumonitis could present in this fashion. Interstitial edema could present in this fashion. 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 Similar findings noted on prior exam. No pneumothorax. No acute bony abnormality. IMPRESSION: 1. Bilateral pulmonary interstitial prominence, left side greater right. Bilateral pneumonitis and/or interstitial edema could present in this fashion. 2. Bilateral pleural thickening with calcified pleural plaques again noted. 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 3. Prior CABG.  Heart size stable. Electronically Signed   By: Curtis Galloway  Register   On: 11/22/2016 10:41  ASSESSMENT AND PLAN:   76 year old male with past medical history of HTN, GERD, history of coronary artery disease, neuropathy who presents to the hospital due to weakness, shortness of breath and noted to be positive for the flu.  1. Acute respiratory failure with hypoxia-secondary to influenza pneumonitis. -Continue O2 supplementation, Tamiflu. - cont. duonebs PRN  2. Flu - pt. Is + for influenza A.  - cont. Tamiflu, droplet precautions.   3. Elevated trop - demand ischemia due to hypoxia, flu.  - no evidence of ACS. Appreciate Cards input.   4. Neuropathy - cont. Neurontin  5. HTN - cont. Losartan/HCTZ, Toprol.   6. Hyperlipidemia - cont. Atorvastatin.    All the records are reviewed and case discussed with Care Management/Social Worker. Management plans discussed with the patient, family and they are in agreement.  CODE STATUS: Full code  DVT Prophylaxis: Lovenox  TOTAL TIME TAKING CARE OF THIS PATIENT: 30 minutes.   POSSIBLE D/C IN 1-2 DAYS, DEPENDING ON CLINICAL CONDITION.   Henreitta Leber M.D on 11/23/2016 at 2:46 PM  Between 7am to 6pm - Pager - [phone]  After 6pm go to [email or web address] - Proofreader  [facility]  Office  [phone]  CC: Primary care physician; Binnie Rail, MD

## 2016-11-23 NOTE — Progress Notes (Signed)
Progress Note  Patient Name: Curtis Galloway Date of Encounter: 11/23/2016  Primary Cardiologist: Curtis Galloway  Subjective   Chest pain improved. Cough improved. Still some dyspnea.   Inpatient Medications    Scheduled Meds: . aspirin  325 mg Oral Daily  . atorvastatin  80 mg Oral QHS  . enoxaparin (LOVENOX) injection  40 mg Subcutaneous Q24H  . gabapentin  100 mg Oral TID  . losartan  50 mg Oral Daily   And  . hydrochlorothiazide  12.5 mg Oral Daily  . metoprolol succinate  50 mg Oral Daily  . multivitamin with minerals  1 tablet Oral Daily  . oseltamivir  30 mg Oral BID   Continuous Infusions: . sodium chloride 75 mL/hr at 11/23/16 0734   PRN Meds: acetaminophen **OR** acetaminophen, guaiFENesin, HYDROcodone-acetaminophen, ipratropium-albuterol, nitroGLYCERIN, ondansetron **OR** ondansetron (ZOFRAN) IV, senna-docusate   Vital Signs    Vitals:   11/22/16 1733 11/22/16 2001 11/23/16 0512 11/23/16 0900  BP:  (!) 162/68 137/60 (!) 152/60  Pulse:  (!) 108 92 99  Resp:  '16 14 20  '$ Temp:  99.6 F (37.6 C) 98.3 F (36.8 C) 98.5 F (36.9 C)  TempSrc:  Oral Oral Oral  SpO2: 97% 95% 97% 98%  Weight:        Intake/Output Summary (Last 24 hours) at 11/23/16 1114 Last data filed at 11/23/16 0900  Gross per 24 hour  Intake                0 ml  Output              350 ml  Net             -350 ml   Filed Weights   11/22/16 1009 11/22/16 1631  Weight: 162 lb (73.5 kg) 158 lb 8 oz (71.9 kg)    Telemetry    NSR - Personally Reviewed  ECG    NSR - Personally Reviewed  Physical Exam   GEN: chronically ill appearing but in No acute distress.   Neck: 6 cm JVD Cardiac: RRR, no murmurs, rubs, or gallops.  Respiratory: Clear to auscultation bilaterally except for scatthered rales.Marland Kitchen GI: Soft, nontender, non-distended  MS: No edema; No deformity. Neuro:  Nonfocal  Psych: Normal affect   Labs    Chemistry Recent Labs Lab 11/22/16 1010 11/23/16 0152  NA 139 139  K  3.8 3.5  CL 94* 99*  CO2 33* 33*  GLUCOSE 105* 97  BUN 28* 28*  CREATININE 1.59* 1.36*  CALCIUM 12.4* 11.0*  GFRNONAA 41* 50*  GFRAA 48* 58*  ANIONGAP 12 7     Hematology Recent Labs Lab 11/22/16 1010 11/23/16 0152  WBC 14.0* 8.4  RBC 4.50 3.79*  HGB 14.3 12.1*  HCT 40.7 33.9*  MCV 90.6 89.6  MCH 31.8 32.0  MCHC 35.1 35.7  RDW 13.9 13.4  PLT 217 177    Cardiac Enzymes Recent Labs Lab 11/22/16 1010 11/22/16 1426 11/22/16 2011 11/23/16 0152  TROPONINI 0.27* 0.26* 0.23* 0.24*   No results for input(s): TROPIPOC in the last 168 hours.   BNP Recent Labs Lab 11/22/16 1010  BNP 196.0*     DDimer No results for input(s): DDIMER in the last 168 hours.   Radiology    Dg Chest 2 View  Result Date: 11/22/2016 CLINICAL DATA:  Shortness of breath.  Chest pain . EXAM: CHEST  2 VIEW COMPARISON:  08/28/2015 FINDINGS: Prior CABG. Heart size normal. No pulmonary venous congestion. Bilateral pulmonary interstitial  prominence noted, particularly prominent on the left. Pneumonitis could present in this fashion. Interstitial edema could present in this fashion. 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 Similar findings noted on prior exam. No pneumothorax. No acute bony abnormality. IMPRESSION: 1. Bilateral pulmonary interstitial prominence, left side greater right. Bilateral pneumonitis and/or interstitial edema could present in this fashion. 2. Bilateral pleural thickening with calcified pleural plaques again noted. 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 3. Prior CABG.  Heart size stable. Electronically Signed   By: Marcello Moores  Register   On: 11/22/2016 10:41    Cardiac Studies   none  Patient Profile     77 y.o. male with longstanding CAD, s/p CABG, admitted with sob and chest pain due to influenza. He remains ill appearing but stable.   Assessment & Plan    1. Non-cardiac chest pain - his troponins are not very  helpful as slight elevation normally seen from stress of flu illness. 2. FLU - he is on medical therapy 3. CAD/CHF - he appears well compensated. I do not feel strongly about repeating the 2D echo as an inpatient unless his course becomes more complicated. Curtis Galloway for DC home from my perspective. I would not anticipate additional cardiac workup.  Curtis Galloway,M.D.  Signed, Curtis Peru, MD  11/23/2016, 11:14 AM  Patient ID: Curtis Galloway, male   DOB: October 12, 1940, 76 y.o.   MRN: [id number]

## 2016-11-24 MED ORDER — METHYLPREDNISOLONE SODIUM SUCC 40 MG IJ SOLR
40.0000 mg | Freq: Two times a day (BID) | INTRAMUSCULAR | Status: DC
  Administered 2016-11-24 – 2016-11-25 (×3): 40 mg via INTRAVENOUS
  Filled 2016-11-24 (×3): qty 1

## 2016-11-24 MED ORDER — ENSURE ENLIVE PO LIQD
237.0000 mL | Freq: Two times a day (BID) | ORAL | Status: DC
  Administered 2016-11-24 – 2016-11-25 (×3): 237 mL via ORAL

## 2016-11-24 MED ORDER — IPRATROPIUM-ALBUTEROL 0.5-2.5 (3) MG/3ML IN SOLN
3.0000 mL | Freq: Four times a day (QID) | RESPIRATORY_TRACT | Status: DC
  Administered 2016-11-24 – 2016-11-25 (×5): 3 mL via RESPIRATORY_TRACT
  Filled 2016-11-24 (×5): qty 3

## 2016-11-24 NOTE — Progress Notes (Signed)
Remains on droplet precautions for flu and treatment in progress,oxygen titrated to 1 liter and maintaining saturations greater than 90%

## 2016-11-24 NOTE — Progress Notes (Signed)
[facility] at [place] NAME: Curtis Galloway    MR#:  [id number]  DATE OF BIRTH:  [birth year]  SUBJECTIVE:   Pt. Here due to shortness of breath, weakness and noted to be + for Flu.  Still feels short of breath on minimal exertion and overall weak. Wife at bedside.    REVIEW OF SYSTEMS:    Review of Systems  Constitutional: Negative for chills and fever.  HENT: Negative for congestion and tinnitus.   Eyes: Negative for blurred vision and double vision.  Respiratory: Positive for cough and shortness of breath. Negative for wheezing.   Cardiovascular: Negative for chest pain, orthopnea and PND.  Gastrointestinal: Negative for abdominal pain, diarrhea, nausea and vomiting.  Genitourinary: Negative for dysuria and hematuria.  Neurological: Positive for weakness. Negative for dizziness, sensory change and focal weakness.  All other systems reviewed and are negative.   Nutrition: Heart Healthy Tolerating Diet: Yes Tolerating PT: Await Eval.   DRUG ALLERGIES:   Allergies  Allergen Reactions  . Codeine Nausea Only    severe  . Tramadol Nausea Only    severe    VITALS:  Blood pressure (!) 114/48, pulse 95, temperature 98.7 F (37.1 C), temperature source Oral, resp. rate 20, height '5\' 9"'$  (1.753 m), weight 71.9 kg (158 lb 8 oz), SpO2 95 %.  PHYSICAL EXAMINATION:   Physical Exam  GENERAL:  76 y.o.-year-old patient lying in the bed weak and in mild resp. Distress.   EYES: Pupils equal, round, reactive to light and accommodation. No scleral icterus. Extraocular muscles intact.  HEENT: Head atraumatic, normocephalic. Oropharynx and nasopharynx clear.  NECK:  Supple, no jugular venous distention. No thyroid enlargement, no tenderness.  LUNGS: Normal breath sounds bilaterally, minimal wheezing, Scattered rhonchi b/l, No rales. No use of accessory muscles of respiration.  CARDIOVASCULAR: S1, S2 normal. No murmurs, rubs, or gallops.  ABDOMEN:  Soft, nontender, nondistended. Bowel sounds present. No organomegaly or mass.  EXTREMITIES: No cyanosis, clubbing or edema b/l.    NEUROLOGIC: Cranial nerves II through XII are intact. No focal Motor or sensory deficits b/l.  Globally weak. PSYCHIATRIC: The patient is alert and oriented x 3.  SKIN: No obvious rash, lesion, or ulcer.    LABORATORY PANEL:   CBC  Recent Labs Lab 11/23/16 0152  WBC 8.4  HGB 12.1*  HCT 33.9*  PLT 177   ------------------------------------------------------------------------------------------------------------------  Chemistries   Recent Labs Lab 11/23/16 0152  NA 139  K 3.5  CL 99*  CO2 33*  GLUCOSE 97  BUN 28*  CREATININE 1.36*  CALCIUM 11.0*   ------------------------------------------------------------------------------------------------------------------  Cardiac Enzymes  Recent Labs Lab 11/23/16 0152  TROPONINI 0.24*   ------------------------------------------------------------------------------------------------------------------  RADIOLOGY:  No results found.   ASSESSMENT AND PLAN:   76 year old male with past medical history of HTN, GERD, history of coronary artery disease, neuropathy who presents to the hospital due to weakness, shortness of breath and noted to be positive for the flu.  1. Acute respiratory failure with hypoxia-secondary to influenza pneumonitis with underlying emphysema, asbestos-related lung disease. -Continue O2 supplementation, Tamiflu. - will give some IV steroids, scheduled duonebs.  - assess for Home O2 prior to discharge as pt. May likely need it.   2. Flu - pt. Is + for influenza A.  - cont. Tamiflu, droplet precautions.   3. Elevated trop - demand ischemia due to hypoxia, flu.  - no evidence of ACS. Appreciate Cards input.   4. Neuropathy - cont. Neurontin  5. HTN - cont. Losartan/HCTZ, Toprol.   6. Hyperlipidemia - cont. Atorvastatin.   Ambulate and assess for Home O2 today.  Possible d/c home tomorrow if improving.   All the records are reviewed and case discussed with Care Management/Social Worker. Management plans discussed with the patient, family and they are in agreement.  CODE STATUS: Full code  DVT Prophylaxis: Lovenox  TOTAL TIME TAKING CARE OF THIS PATIENT: 25 minutes.   POSSIBLE D/C IN 1-2 DAYS, DEPENDING ON CLINICAL CONDITION.   Henreitta Leber M.D on 11/24/2016 at 2:07 PM  Between 7am to 6pm - Pager - [phone]  After 6pm go to [email or web address] - Proofreader  [facility]  Office  [phone]  CC: Primary care physician; Binnie Rail, MD

## 2016-11-24 NOTE — Progress Notes (Signed)
Increased to 2l after neb treatment due to sats of 90% on 1lpm

## 2016-11-24 NOTE — Progress Notes (Signed)
Initial Nutrition Assessment  DOCUMENTATION CODES:   Severe malnutrition in context of acute illness/injury  INTERVENTION:  1. Ensure Enlive po BID, each supplement provides 350 kcal and 20 grams of protein  NUTRITION DIAGNOSIS:   Malnutrition related to acute illness as evidenced by percent weight loss, energy intake < or equal to 50% for > or equal to 5 days.  GOAL:   Patient will meet greater than or equal to 90% of their needs  MONITOR:   PO intake, I & O's, Labs, Supplement acceptance  REASON FOR ASSESSMENT:   Malnutrition Screening Tool    ASSESSMENT:   Pt. Here due to shortness of breath, weakness and noted to be + for Flu.  Still feels short of breath on minimal exertion.  + cough, but non-productive.  Feels weak.   Spoke with patient's wife at bedside. He was very lethargic answered a few questions but kept falling back asleep. She reports they went on a cruise, came back and both found to be flu+ Patient has eaten no food x1 week with reported concomitant 10-12# wt loss. Per chart, patient exhibits a 14#/8.1% severe wt loss over 2 months. Nutrition-Focused physical exam completed. Findings are no fat depletion, severe muscle depletion, and no edema.  Had pineapple chunks this morning, did not eat much else. He claims some food just sits in his mouth and wont go down. Seemed confused. Wife was agreeable to patient consuming ensure until he eats better. Labs and medications reviewed: Solumedrol, MVI w/ Minerals  Diet Order:  Diet Heart Room service appropriate? Yes; Fluid consistency: Thin  Skin:  Reviewed, no issues  Last BM:  11/22/2016  Height:   Ht Readings from Last 1 Encounters:  11/24/16 '5\' 9"'$  (1.753 m)    Weight:   Wt Readings from Last 1 Encounters:  11/22/16 158 lb 8 oz (71.9 kg)    Ideal Body Weight:  72.72 kg  BMI:  Body mass index is 23.41 kg/m.  Estimated Nutritional Needs:   Kcal:  9692-4932 calories  Protein:  71-86  gm  Fluid:  >/= 1.6L  EDUCATION NEEDS:   No education needs identified at this time  Satira Anis. Sani Madariaga, MS, RD LDN Inpatient Clinical Dietitian Pager [phone]

## 2016-11-25 ENCOUNTER — Encounter (INDEPENDENT_AMBULATORY_CARE_PROVIDER_SITE_OTHER): Payer: PPO | Admitting: Ophthalmology

## 2016-11-25 LAB — BASIC METABOLIC PANEL
Anion gap: 6 (ref 5–15)
BUN: 29 mg/dL — ABNORMAL HIGH (ref 6–20)
CALCIUM: 10.7 mg/dL — AB (ref 8.9–10.3)
CO2: 34 mmol/L — AB (ref 22–32)
CREATININE: 1 mg/dL (ref 0.61–1.24)
Chloride: 100 mmol/L — ABNORMAL LOW (ref 101–111)
GFR calc Af Amer: >60 mL/min (ref >60)
GLUCOSE: 166 mg/dL — AB (ref 65–99)
Potassium: 3.7 mmol/L (ref 3.5–5.1)
Sodium: 140 mmol/L (ref 135–145)

## 2016-11-25 MED ORDER — PREDNISONE 10 MG PO TABS: ORAL_TABLET | ORAL | 0 refills | Status: DC

## 2016-11-25 MED ORDER — IPRATROPIUM-ALBUTEROL 0.5-2.5 (3) MG/3ML IN SOLN: 3.0000 mL | Freq: Four times a day (QID) | RESPIRATORY_TRACT | 0 refills | Status: DC | PRN

## 2016-11-25 MED ORDER — OSELTAMIVIR PHOSPHATE 30 MG PO CAPS: 30.0000 mg | ORAL_CAPSULE | Freq: Two times a day (BID) | ORAL | 0 refills | Status: AC

## 2016-11-25 NOTE — Progress Notes (Signed)
Discharge instructions explained to pt and pts spouse/ verbalized an understanding/ iv and tele removed/ RX given to pt/ 02 delivered to room / will transport off unit via wheelchair.

## 2016-11-25 NOTE — Evaluation (Signed)
Physical Therapy Evaluation Patient Details Name: Curtis Galloway MRN: [id number] DOB: [birth year] Today's Date: 11/25/2016   History of Present Illness  Pt is a 76 yo male admitted to [facility] w/ general weakness and SOB, found to be hypoxic and tachycardia diagnosed w/ Influenza A and pneumonia. PMH includes; CAD, CABG w/ coronary stents, HTN, and chronic back and neck pain    Clinical Impression  Pt awake, alert and slightly annoyed but willing to participate in PT eval. O2 was 97% on 2L/min w/ HR of 106 BPM at rest in supine. Overall strength appears WFLs although patient states he feels weaker than usual. He was able to safely perform all mobility tasks under PT supervision and requires use of RW for additional stability in standing. He ambulated 33' w RW and PT supervision and was limited by cardiopulmonary status, HR increased to 123 BPM; O2 remained stable (greater than 93% on 2 L/min) during ambulation and pt required frequent standing rest breaks due to fatigue. Overall patient is able to perform bed mobility, transfers,and household ambulation but is limited in activity tolerance secondary to cardiopulmonary impairments. Recommend use of RW for OOB activities to help w/ balance and energy conservation. Pt will benefit from skilled PT to improve activity tolerance for functional tasks, recommend HHPT following acute hospital stay.      Follow Up Recommendations Home health PT;Supervision for mobility/OOB    Equipment Recommendations       Recommendations for Other Services       Precautions / Restrictions Precautions Precautions: Fall Restrictions Weight Bearing Restrictions: No      Mobility  Bed Mobility Overal bed mobility: Modified Independent             General bed mobility comments: HOB elevated, use of bed rails and increased time to transfer from supine to sitting   Transfers Overall transfer level: Needs assistance Equipment used: Rolling walker (2  wheeled) Transfers: Sit to/from Stand Sit to Stand: Supervision         General transfer comment: cuing for hand placement, use of bilat UE's and RW, good safety throughout transfer no LOB or buckling   Ambulation/Gait Ambulation/Gait assistance: Supervision Ambulation Distance (Feet): 90 Feet Assistive device: Rolling walker (2 wheeled) Gait Pattern/deviations: Step-through pattern;Trunk flexed     General Gait Details: cuing for deep breathing techniques, O2 sat remained above 93%, Limited by increase in HR, rose to 123 BPM, pt recognized need for frequent rest breaks   Stairs            Wheelchair Mobility    Modified Rankin (Stroke Patients Only)       Balance Overall balance assessment: Needs assistance Sitting-balance support: No upper extremity supported;Feet supported Sitting balance-Leahy Scale: Good Sitting balance - Comments: able to maintain upright posture under PT supervision   Standing balance support: Bilateral upper extremity supported Standing balance-Leahy Scale: Good Standing balance comment: Use of RW for additional stability and energy conservation                             Pertinent Vitals/Pain Pain Assessment: Faces Faces Pain Scale: Hurts little more Pain Location: Low back Pain Descriptors / Indicators: Aching Pain Intervention(s): Monitored during session;Premedicated before session;Repositioned    Home Living Family/patient expects to be discharged to:: Private residence Living Arrangements: Spouse/significant other Available Help at Discharge: Available 24 hours/day Type of Home: House Home Access: Level entry (enters through the garage)  Home Layout: Multi-level;Able to live on main level with bedroom/bathroom (states able to live in den on main level, split level home 7 steps w/ bilat railing to bedroom ) Home Equipment: Gilford Rile - 2 wheels;Bedside commode;Shower seat      Prior Function Level of Independence:  Independent               Hand Dominance        Extremity/Trunk Assessment   Upper Extremity Assessment Upper Extremity Assessment: Overall WFL for tasks assessed    Lower Extremity Assessment Lower Extremity Assessment: Overall WFL for tasks assessed       Communication   Communication: No difficulties  Cognition Arousal/Alertness: Awake/alert Behavior During Therapy: WFL for tasks assessed/performed Overall Cognitive Status: Within Functional Limits for tasks assessed                      General Comments      Exercises     Assessment/Plan    PT Assessment Patient needs continued PT services  PT Problem List Decreased activity tolerance;Cardiopulmonary status limiting activity;Decreased knowledge of use of DME;Decreased balance;Decreased mobility       PT Treatment Interventions DME instruction;Gait training;Stair training;Functional mobility training;Balance training;Therapeutic exercise;Therapeutic activities;Patient/family education    PT Goals (Current goals can be found in the Care Plan section)  Acute Rehab PT Goals Patient Stated Goal: Return home and get better PT Goal Formulation: With patient/family Time For Goal Achievement: 12/09/16 Potential to Achieve Goals: Good    Frequency Min 2X/week   Barriers to discharge       Co-evaluation               End of Session Equipment Utilized During Treatment: Gait belt;Oxygen Activity Tolerance: Patient limited by fatigue Patient left: in chair;with call bell/phone within reach;with chair alarm set Nurse Communication: Mobility status           Time: 9311-2162 PT Time Calculation (min) (ACUTE ONLY): 31 min   Charges:         PT G Codes:         [employer] Student PT 11/25/2016, 2:50 PM

## 2016-11-25 NOTE — Discharge Summary (Signed)
[facility] [place] NAME: Curtis Galloway    MR#:  [id number]  DATE OF BIRTH:  [birth year]  DATE OF ADMISSION:  11/22/2016 ADMITTING PHYSICIAN: Bettey Costa, MD  DATE OF DISCHARGE: 11/25/2016  PRIMARY CARE PHYSICIAN: Binnie Rail, MD    ADMISSION DIAGNOSIS:  Influenza A [J10.1] Acute respiratory failure with hypoxia (HCC) [J96.01] Non-ST elevation myocardial infarction (NSTEMI), type 2 ([place]) [I21.4] Community acquired pneumonia, unspecified laterality [J18.9]  DISCHARGE DIAGNOSIS:  Active Problems:   Sepsis ([place])   Acute respiratory failure with hypoxia ([place])   Influenza A   Demand ischemia ([place])   Coronary artery disease of bypass graft of native heart with stable angina pectoris ([place])   Chronic back pain   SECONDARY DIAGNOSIS:   Past Medical History:  Diagnosis Date  . Abnormal nuclear stress test 11/16/2013  . Coronary artery disease    Dr. Johnsie Cancel,  stent to LAD  . GERD (gastroesophageal reflux disease)   . Hyperlipidemia   . 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 Hypertension     HOSPITAL COURSE:   76 year old male with past medical history of HTN, GERD, history of coronary artery disease, neuropathy who presents to the hospital due to weakness, shortness of breath and noted to be positive for the flu.  1. Acute respiratory failure with hypoxia-secondary to influenza pneumonitis with underlying emphysema, asbestos-related lung disease. -Patient has clinically improved with IV steroids, DuoNeb's, Tamiflu. -He is now being discharged on oral prednisone taper, DuoNeb's as needed at home and also with a few more days of Tamiflu. -He ambulated and did qualify for home oxygen which is being arranged for him prior to discharge.  2. Flu - pt. Is + for influenza A. - improved. Pt. Will finish his Tamiflu.     3. Elevated trop - demand ischemia due to hypoxia, flu.  - no evidence of ACS. Appreciate Cards input and no plans for  any further intervention. Outpatient follow up.   4. Neuropathy - he will cont. Neurontin  5. HTN - he will cont. Losartan/HCTZ, Toprol.   6. Hyperlipidemia - he will cont. Atorvastatin.   DISCHARGE CONDITIONS:   Stable.   CONSULTS OBTAINED:  Treatment Team:  Minna Merritts, MD  DRUG ALLERGIES:   Allergies  Allergen Reactions  . Codeine Nausea Only    severe  . Tramadol Nausea Only    severe    DISCHARGE MEDICATIONS:   Allergies as of 11/25/2016      Reactions   Codeine Nausea Only   severe   Tramadol Nausea Only   severe      Medication List    STOP taking these medications   losartan 25 MG tablet Commonly known as:  COZAAR     TAKE these medications   aspirin 325 MG tablet Take 325 mg by mouth daily.   atorvastatin 80 MG tablet Commonly known as:  LIPITOR Take 1 tablet (80 mg total) by mouth at bedtime.   FISH OIL PO Take 1 capsule by mouth daily. Mega Red brand   gabapentin 100 MG capsule Commonly known as:  NEURONTIN Take 100 mg by mouth 3 (three) times daily.   HYDROcodone-acetaminophen 5-325 MG tablet Commonly known as:  NORCO/VICODIN Take 1 tablet by mouth every 6 (six) hours as needed.   ipratropium-albuterol 0.5-2.5 (3) MG/3ML Soln Commonly known as:  DUONEB Take 3 mLs by nebulization every 6 (six) hours as needed.   losartan-hydrochlorothiazide 50-12.5 MG tablet Commonly  known as:  HYZAAR Take 1 tablet by mouth daily.   metoprolol succinate 50 MG 24 hr tablet Commonly known as:  TOPROL-XL Take 1 tablet (50 mg total) by mouth daily. Take with Metoprolol 25 mg tablet   metoprolol succinate 25 MG 24 hr tablet Commonly known as:  TOPROL XL Take 1 tablet (25 mg total) by mouth daily. Take with metoprolol 50 mg   multivitamin tablet Take 1 tablet by mouth daily.   nitroGLYCERIN 0.4 MG SL tablet Commonly known as:  NITROSTAT Place 1 tablet (0.4 mg total) under the tongue every 5 (five) minutes as needed for chest pain.    oseltamivir 30 MG capsule Commonly known as:  TAMIFLU Take 1 capsule (30 mg total) by mouth 2 (two) times daily.   predniSONE 10 MG tablet Commonly known as:  DELTASONE Label  & dispense according to the schedule below. 5 Pills PO for 1 day then, 4 Pills PO for 1 day, 3 Pills PO for 1 day, 2 Pills PO for 1 day, 1 Pill PO for 1 days then STOP.            Durable Medical Equipment        Start     Ordered   11/25/16 1430  DME Oxygen  Once    Question Answer Comment  Mode or (Route) Nasal cannula   Liters per Minute 2   Frequency Continuous (stationary and portable oxygen unit needed)   Oxygen conserving device Yes   Oxygen delivery system Gas      11/25/16 1429   11/25/16 1430  For home use only DME Nebulizer machine  Once    Question:  Patient needs a nebulizer to treat with the following condition  Answer:  COPD (chronic obstructive pulmonary disease) ([place])   11/25/16 1429        DISCHARGE INSTRUCTIONS:   DIET:  Cardiac diet  DISCHARGE CONDITION:  Stable  ACTIVITY:  Activity as tolerated  OXYGEN:  Home Oxygen: Yes.     Oxygen Delivery: 2 liters/min via Patient connected to nasal cannula oxygen  DISCHARGE LOCATION:  Home with Home Health nursing/PT.    If you experience worsening of your admission symptoms, develop shortness of breath, life threatening emergency, suicidal or homicidal thoughts you must seek medical attention immediately by calling 911 or calling your MD immediately  if symptoms less severe.  You Must read complete instructions/literature along with all the possible adverse reactions/side effects for all the Medicines you take and that have been prescribed to you. Take any new Medicines after you have completely understood and accpet all the possible adverse reactions/side effects.   Please note  You were cared for by a hospitalist during your hospital stay. If you have any questions about your discharge medications or the care you received  while you were in the hospital after you are discharged, you can call the unit and asked to speak with the hospitalist on call if the hospitalist that took care of you is not available. Once you are discharged, your primary care physician will handle any further medical issues. Please note that NO REFILLS for any discharge medications will be authorized once you are discharged, as it is imperative that you return to your primary care physician (or establish a relationship with a primary care physician if you do not have one) for your aftercare needs so that they can reassess your need for medications and monitor your lab values.     Today   Still  feels a bit weak. Complaining of some Neck/back pain.  Seen by PT and they recommend Home health services. Did qualify for Home O2. Will d/c home today.   VITAL SIGNS:  Blood pressure (!) 116/54, pulse 97, temperature 98.4 F (36.9 C), temperature source Oral, resp. rate 18, height '5\' 9"'$  (1.753 m), weight 71.9 kg (158 lb 8 oz), SpO2 100 %.  I/O:    Intake/Output Summary (Last 24 hours) at 11/25/16 1510 Last data filed at 11/25/16 0900  Gross per 24 hour  Intake              480 ml  Output                0 ml  Net              480 ml    PHYSICAL EXAMINATION:   GENERAL:  76 y.o.-year-old patient lying in the bed weak and in mild resp. Distress.   EYES: Pupils equal, round, reactive to light and accommodation. No scleral icterus. Extraocular muscles intact.  HEENT: Head atraumatic, normocephalic. Oropharynx and nasopharynx clear.  NECK:  Supple, no jugular venous distention. No thyroid enlargement, no tenderness.  LUNGS: Normal breath sounds bilaterally, no wheezing, rhonchi, No rales. No use of accessory muscles of respiration.  CARDIOVASCULAR: S1, S2 normal. No murmurs, rubs, or gallops.  ABDOMEN: Soft, nontender, nondistended. Bowel sounds present. No organomegaly or mass.  EXTREMITIES: No cyanosis, clubbing or edema b/l.    NEUROLOGIC:  Cranial nerves II through XII are intact. No focal Motor or sensory deficits b/l.   PSYCHIATRIC: The patient is alert and oriented x 3.  SKIN: No obvious rash, lesion, or ulcer.   DATA REVIEW:   CBC  Recent Labs Lab 11/23/16 0152  WBC 8.4  HGB 12.1*  HCT 33.9*  PLT 177    Chemistries   Recent Labs Lab 11/25/16 0713  NA 140  K 3.7  CL 100*  CO2 34*  GLUCOSE 166*  BUN 29*  CREATININE 1.00  CALCIUM 10.7*    Cardiac Enzymes  Recent Labs Lab 11/23/16 0152  TROPONINI 0.24*    Microbiology Results  Results for orders placed or performed during the hospital encounter of 11/22/16  Blood culture (routine x 2)     Status: None (Preliminary result)   Collection Time: 11/22/16 11:41 AM  Result Value Ref Range Status   Specimen Description BLOOD RIGHT ASSIST CONTROL  Final   Special Requests   Final    BOTTLES DRAWN AEROBIC AND ANAEROBIC ANA16ML AER11ML   Culture NO GROWTH 3 DAYS  Final   Report Status PENDING  Incomplete  Blood culture (routine x 2)     Status: None (Preliminary result)   Collection Time: 11/22/16 11:41 AM  Result Value Ref Range Status   Specimen Description BLOOD RIGHT HAND  Final   Special Requests BOTTLES DRAWN AEROBIC AND ANAEROBIC ANA6ML AER6ML  Final   Culture NO GROWTH 3 DAYS  Final   Report Status PENDING  Incomplete    RADIOLOGY:  No results found.    Management plans discussed with the patient, family and they are in agreement.  CODE STATUS:     Code Status Orders        Start     Ordered   11/22/16 1712  Limited resuscitation (code)  Continuous    Question Answer Comment  In the event of cardiac or respiratory ARREST: Initiate Code Blue, Call Rapid Response Yes   In the event of  cardiac or respiratory ARREST: Perform CPR Yes   In the event of cardiac or respiratory ARREST: Perform Intubation/Mechanical Ventilation No   In the event of cardiac or respiratory ARREST: Use NIPPV/BiPAp only if indicated Yes   In the event of  cardiac or respiratory ARREST: Administer ACLS medications if indicated Yes   In the event of cardiac or respiratory ARREST: Perform Defibrillation or Cardioversion if indicated Yes      11/22/16 1711    Code Status History    Date Active Date Inactive Code Status Order ID Comments User Context   11/22/2016  4:38 PM 11/22/2016  5:11 PM DNR [id number]  Bettey Costa, MD Inpatient   03/16/2015  1:39 PM 03/16/2015  8:24 PM Full Code [id number]  Josue Hector, MD Inpatient   11/23/2014 10:31 AM 11/24/2014  3:38 AM Full Code [id number]  Inge Rise, MD HOV   11/15/2013  3:29 PM 11/16/2013  2:05 PM Full Code [id number]  Burnell Blanks, MD Inpatient    Advance Directive Documentation   Flowsheet Row Most Recent Value  Type of Advance Directive  Healthcare Power of Attorney, Living will  Pre-existing out of facility DNR order (yellow form or pink MOST form)  No data  "MOST" Form in Place?  No data      TOTAL TIME TAKING CARE OF THIS PATIENT: 40 minutes.    Henreitta Leber M.D on 11/25/2016 at 3:10 PM  Between 7am to 6pm - Pager - [phone]  After 6pm go to [email or web address] - Proofreader  [facility] [place] [facility]  Office  [phone]  CC: Primary care physician; Binnie Rail, MD

## 2016-11-25 NOTE — Care Management (Signed)
Patient has qualified for home 02 and also has an underlying emphysema and heart failure documented. He is admitted from home with influenza and pneumonia. Obtained order for PT and OT consult

## 2016-11-25 NOTE — Plan of Care (Signed)
Problem: Safety: Goal: Ability to remain free from injury will improve Outcome: Completed/Met Date Met: 11/25/16 Continues to remain free from injury/falls. Does not score high enough for bed alarm, non skid socks in place, encouraged to call if help is needed.  Problem: Pain Managment: Goal: General experience of comfort will improve Outcome: Progressing Treated pt for chronic neck pain once with norco, pt had relief.  Problem: Bowel/Gastric: Goal: Will not experience complications related to bowel motility Outcome: Completed/Met Date Met: 11/25/16 Pt had BM this shift

## 2016-11-25 NOTE — Progress Notes (Signed)
SATURATION QUALIFICATIONS: (This note is used to comply with regulatory documentation for home oxygen)  Patient Saturations on Room Air at Rest = 92%  Patient Saturations on Room Air while Ambulating = 85%  Patient Saturations on 2 Liters of oxygen while Ambulating = 91%  Please briefly explain why patient needs home oxygen:

## 2016-11-25 NOTE — Clinical Social Work Note (Signed)
CSW received referral for SNF.  Case discussed with case manager and plan is to discharge home with home health.  CSW to sign off please re-consult if social work needs arise.  Terrica Duecker R. Lorelee Mclaurin, MSW, LCSWA [phone]  

## 2016-11-25 NOTE — Care Management (Signed)
Patient is agreeable to home health physical therapy.  he has a co pay with his insurance for home health and says that if he needs any type of nursing intervention his "medic buddies" from the fire house will assist him.  Notified attending.  Agency preference for home health and oxygen is [facility].  [facility] can provide the oxygen but is not able to staff the case in [place].  Referral for PT called to [facility].   Referral made to [facility]

## 2016-11-26 DIAGNOSIS — M6281 Muscle weakness (generalized): Secondary | ICD-10-CM | POA: Diagnosis not present

## 2016-11-26 DIAGNOSIS — R269 Unspecified abnormalities of gait and mobility: Secondary | ICD-10-CM | POA: Diagnosis not present

## 2016-11-26 DIAGNOSIS — M48061 Spinal stenosis, lumbar region without neurogenic claudication: Secondary | ICD-10-CM | POA: Diagnosis not present

## 2016-11-26 DIAGNOSIS — J9601 Acute respiratory failure with hypoxia: Secondary | ICD-10-CM | POA: Diagnosis not present

## 2016-11-26 DIAGNOSIS — J439 Emphysema, unspecified: Secondary | ICD-10-CM | POA: Diagnosis not present

## 2016-11-27 ENCOUNTER — Other Ambulatory Visit: Payer: Self-pay

## 2016-11-27 ENCOUNTER — Telehealth: Payer: Self-pay | Admitting: *Deleted

## 2016-11-27 LAB — CULTURE, BLOOD (ROUTINE X 2)
CULTURE: NO GROWTH
CULTURE: NO GROWTH

## 2016-11-27 NOTE — Telephone Encounter (Signed)
Dr. Quay Burow I'm trying to set-up a TCM hosp f/u appt but you are pretty much booked. They are wanting to see you only can you look at your schedule to see where I can fit them in. Thanks...Johny Chess

## 2016-11-27 NOTE — Patient Outreach (Signed)
[facility] [facility]) Care Management  11/27/2016  [place] 1940/10/20 [id number]  REFERRAL DATE: 11/27/16 REFERRAL SOURCE: Hospital liaison   REFERRAL REASON: transition of care / status post hospital discharged on 11/26/16 for COPD/ pneumonia CONSENT:   Telephone call to patient for transition of care.  Unable to reach patient. HIPAA compliant voice message left with call back phone number  PLAN: RNCM will attempt 2nd telephone call to patient within 1 week.   Quinn Plowman RN,BSN,CCM [facility] Telephonic  [phone]

## 2016-11-27 NOTE — Telephone Encounter (Signed)
Can do mon 4pm or thurs 11:15

## 2016-11-28 NOTE — Telephone Encounter (Signed)
Called pt/wife no answer LMOM RTC...Johny Chess

## 2016-11-28 NOTE — Telephone Encounter (Signed)
Wife return call back would like to come Mon @ 4 so we completed TCM call below.../lmb  Transition Care Management Follow-up Telephone Call   Date discharged? 11/25/16   How have you been since you were released from the hospital? Wife states he is doing pretty good   Do you understand why you were in the hospital? YES   Do you understand the discharge instructions? YES   Where were you discharged to? Home   Items Reviewed:  Medications reviewed: YES  Allergies reviewed: YES  Dietary changes reviewed: YES  Referrals reviewed: No referral needed   Functional Questionnaire:   Activities of Daily Living (ADLs):   She states states they are independent in the following: bathing and hygiene, feeding, continence, grooming, toileting and dressing States he require assistance with the following: ambulation   Any transportation issues/concerns?: NO   Any patient concerns? NO   Confirmed importance and date/time of follow-up visits scheduled YES, appt made 12/02/16  Provider Appointment booked with Dr. Quay Burow  Confirmed with patient if condition begins to worsen call PCP or go to the ER.  Patient was given the office number and encouraged to call back with question or concerns.  : YES

## 2016-11-29 ENCOUNTER — Other Ambulatory Visit: Payer: Self-pay

## 2016-11-29 NOTE — Patient Outreach (Signed)
[facility] [facility]) Care Management  11/29/2016  [place] [birth year] [id number]  REFERRAL DATE: 11/27/16 REFERRAL SOURCE: Hospital liaison          REFERRAL REASON: transition of care / status post hospital discharged on 11/26/16 for COPD/ pneumonia  Second telephone call to patient for transition of care. Attempted call at listed home number.  Phone rang busy.  Attempted call to listed mobile number. Contact answering phone states power is out therefore hone phone not working.  Mobile number has bad connection and is going in and out.  Contact request return call at another time.   PLAN:  RNCM will attempt 3rd telephone outreach to patient within 1 week.   Quinn Plowman RN,BSN,CCM [facility] Telephonic  [phone]

## 2016-12-01 NOTE — Progress Notes (Signed)
0   Subjective:    Patient ID: Curtis Galloway, male    DOB: 26-Feb-1941, 76 y.o.   MRN: [id number]  HPI He is here for follow up from the hospital.     Admitted 11/22/16.  Discharged 11/25/16 Diagnoses: Influenza, Acute respiratory failure with hypoxia, non - STEMI, CAP,Sepsis  He has known CAD and is s/p CABG and stents.  He went to the ED with generalized weakness and SOB.  24 prior to going to the ED he was having SOB, chills, fevers.  He was hypoxic in the ED.  Influenza test was positive and treated for CAP with ceftriaxone and azithromycin.  His EKG shows sinus tach with ST depressions in inferior leads. His troponins were elevated.  He had leukocytosis, CXR showed pneumonitis consistent with influenza pneumonitis.  He was started on tamiflu, oxygen.  Cardiology was consulted.    Acute respiratory failure with hypoxia secondary to influenza pneumonitis with underlying emphysema, asbestos lung disease.  He received IV steroids, Duonebs, Tamiflu.  He was discharged on oral prednisone, Duonebs as needed and tamiflu. He completed the tamiflu and steroids.  He did qualify for home oxygen and is using it.  He coughs and nothing comes up.  He was advised to start taking mucinex- DM and took it first time today.  He is doing the duo nebs.    Elevated troponin.  Cardiology was consulted.  Elevated troponins likely related to demand ischemia due to hypoxia, flu.  There was no evidence of ACS.  He will have outpatient follow up with cardiology. He does have increased leg swelling which is new for him.   Prior to going to the hospital he was on hctz - this was discontinued.  He is having SOB - this has not improved since being home.   His neuropathy, htn, hyperlipidemia were stable in the hospital.   He is having significant neck pain and that is his only concern today.  He sneezed one last a few weeks ago and this is when the pain started. He is confident he has a herniated disc.  He is seeing Dr Maryjean Ka  and will have an MRI of his neck on Wednesday.  He is having pain in his left arm.   He is taking gabapentin - was started on this 13 days ago.  He takes the vicodin twice daily and it does help.  His pain is not controlled.  Medications and allergies reviewed with patient and updated if appropriate.  Patient Active Problem List   Diagnosis Date Noted  . Sepsis ([place]) 11/22/2016  . Acute respiratory failure with hypoxia ([place])   . Influenza A   . Demand ischemia ([place])   . Coronary artery disease of bypass graft of native heart with stable angina pectoris ([place])   . Chronic back pain   . Viral URI with cough 09/12/2016  . Bilateral lumbar radiculopathy 07/08/2014  . Diabetes mellitus with peripheral vascular disease ([place]) 12/06/2013  . Abnormal nuclear stress test, 11/10/13 11/16/2013  . Adenomatous polyp 04/06/2013  . Unstable angina ([place]) 11/02/2011  . HYPERNEPHROMA 10/11/2009  . Elevated lipids 10/11/2009  . Essential hypertension, benign 10/11/2009  . Coronary atherosclerosis of native coronary artery, 11/15/13 PTCA/DES prox LAD, Promus 10/11/2009  . GERD 10/11/2009    Current Outpatient Prescriptions on File Prior to Visit  Medication Sig Dispense Refill  . aspirin 325 MG tablet Take 325 mg by mouth daily.    Marland Kitchen atorvastatin (LIPITOR) 80 MG tablet Take 1 tablet (  80 mg total) by mouth at bedtime. 90 tablet 3  . gabapentin (NEURONTIN) 100 MG capsule Take 100 mg by mouth 3 (three) times daily.    Marland Kitchen HYDROcodone-acetaminophen (NORCO/VICODIN) 5-325 MG tablet Take 1 tablet by mouth every 6 (six) hours as needed. 12 tablet 0  . ipratropium-albuterol (DUONEB) 0.5-2.5 (3) MG/3ML SOLN Take 3 mLs by nebulization every 6 (six) hours as needed. 360 mL 0  . metoprolol succinate (TOPROL XL) 25 MG 24 hr tablet Take 1 tablet (25 mg total) by mouth daily. Take with metoprolol 50 mg 90 tablet 3  . metoprolol succinate (TOPROL-XL) 50 MG 24 hr tablet Take 1 tablet (50 mg total) by mouth daily. Take with  Metoprolol 25 mg tablet 90 tablet 3  . Multiple Vitamin (MULTIVITAMIN) tablet Take 1 tablet by mouth daily.      . nitroGLYCERIN (NITROSTAT) 0.4 MG SL tablet Place 1 tablet (0.4 mg total) under the tongue every 5 (five) minutes as needed for chest pain. 25 tablet 4  . Omega-3 Fatty Acids (FISH OIL PO) Take 1 capsule by mouth daily. Mega Red brand     No current facility-administered medications on file prior to visit.     Past Medical History:  Diagnosis Date  . Abnormal nuclear stress test 11/16/2013  . Coronary artery disease    Dr. Johnsie Cancel,  stent to LAD  . GERD (gastroesophageal reflux disease)   . Hyperlipidemia   . Hypernephroma [facility]) 2004   Dr Rosana Hoes  . Hypertension     Past Surgical History:  Procedure Laterality Date  . APPENDECTOMY    . CARDIAC CATHETERIZATION  11/01/2011   Dr Johnsie Cancel  . CARDIAC CATHETERIZATION N/A 03/16/2015   Procedure: Left Heart Cath and Cors/Grafts Angiography;  Surgeon: Josue Hector, MD;  Location: [facility];  Service: Cardiovascular;  Laterality: N/A;  . CERVICAL LAMINECTOMY     x 1  . colonoscopy with polypectomy  2014   Dr Paulita Fujita  . colonscopy  2006   Dr. Delfin Edis; negative  . CORONARY ANGIOPLASTY WITH STENT PLACEMENT  1990's; 11/15/2013   "today's make a total of 3" (11/15/2013)  . CORONARY ANGIOPLASTY WITH STENT PLACEMENT  11/15/13   Promus to prox. LAD  . CORONARY ARTERY BYPASS GRAFT  2000   Dr. Roxan Hockey 2001  . KNEE SURGERY  3-4 surgeries   bone moved from hip to under knee cap  . LEFT HEART CATHETERIZATION WITH CORONARY/GRAFT ANGIOGRAM  11/01/2011   Procedure: LEFT HEART CATHETERIZATION WITH Beatrix Fetters;  Surgeon: Josue Hector, MD;  Location[facility] C[facility] LAB;  Service: Cardiovascular;;  . LEFT HEART CATHETERIZATION WITH CORONARY/GRAFT ANGIOGRAM N/A 11/15/2013   Procedure: LEFT HEART CATHETERIZATION WITH Beatrix Fetters;  Surgeon: Burnell Blanks, MD;  Location: [facility] CATH LAB;  Service: Cardiovascular;   Laterality: N/A;  . LUMBAR FUSION  06/08/2013   [place]  . LUMBAR LAMINECTOMY     x 4  . 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 TONSILLECTOMY      Social History   Social History  . Marital status: Married    Spouse name: N/A  . Number of children: N/A  . Years of education: N/A   Social History Main Topics  . Smoking status: Former Smoker    Packs/day: 1.00    Years: 38.00    Types: Cigarettes    Quit date: 10/01/1996  . Smokeless tobacco: Never Used     Comment: smoked 1960-1998, up 1 ppd  . Alcohol  use No  . Drug use: No  . Sexual activity: Yes   Other Topics Concern  . Not on file   Social History Narrative   Retired   Married   Former smoker   Alcohol use-no   Regular exercise-no    Family History  Problem Relation Age of Onset  . Heart attack Father 54  . Diabetes Mother   . Coronary artery disease Mother   . Heart attack Brother 80  . Heart attack Brother     in his 61s  . Cancer Maternal Grandmother     uterine cancer  . Coronary artery disease Other     M & P aunts/uncles  . Stroke Neg Hx     Review of Systems  Constitutional: Positive for appetite change (decreased) and fatigue. Negative for fever.  Respiratory: Positive for cough, shortness of breath and wheezing.   Cardiovascular: Positive for leg swelling. Negative for chest pain and palpitations.  Gastrointestinal: Negative for abdominal pain and constipation.  Neurological: Negative for light-headedness and headaches.       Objective:   Vitals:   12/02/16 1602  BP: 112/76  Pulse: (!) 105  Resp: 16  Temp: 97.6 F (36.4 C)   Filed Weights   12/02/16 1602  Weight: 167 lb (75.8 kg)   Body mass index is 24.66 kg/m.  Wt Readings from Last 3 Encounters:  12/02/16 167 lb (75.8 kg)  11/22/16 158 lb 8 oz (71.9 kg)  10/18/16 169 lb (76.7 kg)     Physical Exam  Constitutional: No distress.  Chronically ill appearing, fatigue/tired appearing, dosing at times, wearing [place] with 2  L/min  HENT:  Head: Normocephalic and atraumatic.  Mouth/Throat: Oropharynx is clear and moist. No oropharyngeal exudate.  Eyes: Conjunctivae are normal.  Neck: Neck supple. No thyromegaly present.  Cardiovascular:  No murmur heard. Mild tachycardia, regular rhythm  Pulmonary/Chest:  Decreased BS diffusely, bibasilar crackles, no wheeze; SOB with exertion  Abdominal: Soft. He exhibits no distension. There is no tenderness.  Musculoskeletal: He exhibits edema (2 + pitting edema b/l LE).  Lymphadenopathy:    He has no cervical adenopathy.  Skin: He is not diaphoretic.          Assessment & Plan:   See Problem List for Assessment and Plan of chronic medical problems.

## 2016-12-02 ENCOUNTER — Other Ambulatory Visit (INDEPENDENT_AMBULATORY_CARE_PROVIDER_SITE_OTHER): Payer: PPO

## 2016-12-02 ENCOUNTER — Ambulatory Visit (INDEPENDENT_AMBULATORY_CARE_PROVIDER_SITE_OTHER)
Admission: RE | Admit: 2016-12-02 | Discharge: 2016-12-02 | Disposition: A | Discharge status: Home / Self Care | Payer: PPO | Source: Ambulatory Visit | Attending: Internal Medicine | Admitting: Internal Medicine

## 2016-12-02 ENCOUNTER — Ambulatory Visit (INDEPENDENT_AMBULATORY_CARE_PROVIDER_SITE_OTHER): Payer: PPO | Admitting: Internal Medicine

## 2016-12-02 ENCOUNTER — Encounter: Payer: Self-pay | Admitting: Internal Medicine

## 2016-12-02 ENCOUNTER — Telehealth: Payer: Self-pay | Admitting: Internal Medicine

## 2016-12-02 ENCOUNTER — Ambulatory Visit: Payer: Self-pay

## 2016-12-02 VITALS — BP 112/76 | HR 105 | Temp 97.6°F | Resp 16 | Wt 167.0 lb

## 2016-12-02 DIAGNOSIS — J101 Influenza due to other identified influenza virus with other respiratory manifestations: Secondary | ICD-10-CM

## 2016-12-02 DIAGNOSIS — R05 Cough: Secondary | ICD-10-CM | POA: Diagnosis not present

## 2016-12-02 DIAGNOSIS — M542 Cervicalgia: Secondary | ICD-10-CM | POA: Diagnosis not present

## 2016-12-02 DIAGNOSIS — R600 Localized edema: Secondary | ICD-10-CM

## 2016-12-02 DIAGNOSIS — J9601 Acute respiratory failure with hypoxia: Secondary | ICD-10-CM | POA: Diagnosis not present

## 2016-12-02 DIAGNOSIS — R0602 Shortness of breath: Secondary | ICD-10-CM

## 2016-12-02 LAB — COMPREHENSIVE METABOLIC PANEL
ALT: 22 U/L (ref 0–53)
AST: 18 U/L (ref 0–37)
Albumin: 2.8 g/dL — ABNORMAL LOW (ref 3.5–5.2)
Alkaline Phosphatase: 91 U/L (ref 39–117)
BUN: 25 mg/dL — AB (ref 6–23)
CALCIUM: 12.6 mg/dL — AB (ref 8.4–10.5)
CHLORIDE: 98 meq/L (ref 96–112)
CO2: 37 meq/L — AB (ref 19–32)
Creatinine, Ser: 1.36 mg/dL (ref 0.40–1.50)
GFR: 54.43 mL/min — ABNORMAL LOW (ref >60.00)
Glucose, Bld: 121 mg/dL — ABNORMAL HIGH (ref 70–99)
Potassium: 4.2 mEq/L (ref 3.5–5.1)
SODIUM: 143 meq/L (ref 135–145)
Total Bilirubin: 0.5 mg/dL (ref 0.2–1.2)
Total Protein: 5.9 g/dL — ABNORMAL LOW (ref 6.0–8.3)

## 2016-12-02 LAB — CBC WITH DIFFERENTIAL/PLATELET
Basophils Absolute: 0.1 10*3/uL (ref 0.0–0.1)
Basophils Relative: 0.3 % (ref 0.0–3.0)
Eosinophils Absolute: 0.2 10*3/uL (ref 0.0–0.7)
Eosinophils Relative: 0.8 % (ref 0.0–5.0)
HCT: 39.8 % (ref 39.0–52.0)
Hemoglobin: 13 g/dL (ref 13.0–17.0)
Lymphocytes Relative: 5.2 % — ABNORMAL LOW (ref 12.0–46.0)
Lymphs Abs: 1.1 10*3/uL (ref 0.7–4.0)
MCHC: 32.8 g/dL (ref 30.0–36.0)
MCV: 92.8 fl (ref 78.0–100.0)
MONO ABS: 2.2 10*3/uL — AB (ref 0.1–1.0)
Monocytes Relative: 9.9 % (ref 3.0–12.0)
NEUTROS ABS: 18.6 10*3/uL — AB (ref 1.4–7.7)
Neutrophils Relative %: 83.8 % — ABNORMAL HIGH (ref 43.0–77.0)
PLATELETS: 402 10*3/uL — AB (ref 150.0–400.0)
RBC: 4.28 Mil/uL (ref 4.22–5.81)
RDW: 13.9 % (ref 11.5–15.5)
WBC: 22.2 10*3/uL (ref 4.0–10.5)

## 2016-12-02 MED ORDER — HYDROCODONE-ACETAMINOPHEN 5-325 MG PO TABS: 1.0000 | ORAL_TABLET | Freq: Three times a day (TID) | ORAL | 0 refills | Status: DC | PRN

## 2016-12-02 NOTE — Assessment & Plan Note (Signed)
Related to pneumonitis secondary to influenza Started on oxygen in the hospital - continue - still needs it - was not wearing oxygen when he came in and oxygen was low 80's Acute respiratory failure with chronic lung disease Will refer to pulm

## 2016-12-02 NOTE — Assessment & Plan Note (Addendum)
Having SOB - on oxygen via [place] - which is new - not improved as much as I would like to see Completed steroids ? Pneumonia,, pneumonitiisHF Will check labs, cxr Continue oxygen via [place] May need to go back to the hospital, additional antibiotics, steroids

## 2016-12-02 NOTE — Assessment & Plan Note (Signed)
Edema is new for him hctz was d/c'd while he was in the hospital Concern for CHF, copd exac/pneumonia Check cxr, bnp, cbc, cmp

## 2016-12-02 NOTE — Telephone Encounter (Signed)
Lab called, WBC 22K. Full results not available.  Chart reviewed, was just seen at the office for a hospital f/u.  Spoke w/ pt's wife, respiratory status ok, main problem is neck pain and feeling sleepy, no fever. Neck pain preceded the recent admission. Will wait for full lab results,  notify PCP, advise ER if fever or if he gets worse.

## 2016-12-02 NOTE — Patient Instructions (Addendum)
A chest xray and blood work was ordered.  Test(s) ordered today. Your results will be released to [place] (or called to you) after review, usually within 72hours after test completion. If any changes need to be made, you will be notified at that same time.     Medications reviewed and updated.  Changes include stop the gabapentin.  Increase vicodin to three times a day.  He can take it four times a day if needed.    Please followup in 1 month, sooner if needed

## 2016-12-02 NOTE — Assessment & Plan Note (Signed)
Following with neurosurgery Taking gapapentin - may be make him drowsy - will hold for since it is not helped much  Continue vicodin - increase to 3/day - can increase to 4/day if needed - less is better MRI in two days

## 2016-12-02 NOTE — Assessment & Plan Note (Signed)
associated with pneumonitis Completed tamiflu

## 2016-12-02 NOTE — Telephone Encounter (Signed)
Noted. Will follow up labs, CXR. Concern for lung infection - was recently hospitalized with influenza, possible PNA.  On oxygen - still with significant pulmonary symptoms suggestive of an infection, possible heart failure.

## 2016-12-02 NOTE — Progress Notes (Signed)
Pre visit review using our clinic review tool, if applicable. No additional management support is needed unless otherwise documented below in the visit note. 

## 2016-12-03 ENCOUNTER — Encounter (HOSPITAL_COMMUNITY): Payer: Self-pay

## 2016-12-03 ENCOUNTER — Inpatient Hospital Stay (HOSPITAL_COMMUNITY)
Admission: EM | Admit: 2016-12-03 | Discharge: 2016-12-29 | DRG: 987 | Disposition: E | Payer: PPO | Attending: Internal Medicine | Admitting: Internal Medicine

## 2016-12-03 ENCOUNTER — Emergency Department (HOSPITAL_COMMUNITY): Payer: PPO

## 2016-12-03 ENCOUNTER — Telehealth: Payer: Self-pay | Admitting: Internal Medicine

## 2016-12-03 DIAGNOSIS — I2699 Other pulmonary embolism without acute cor pulmonale: Secondary | ICD-10-CM | POA: Diagnosis not present

## 2016-12-03 DIAGNOSIS — R599 Enlarged lymph nodes, unspecified: Secondary | ICD-10-CM | POA: Diagnosis not present

## 2016-12-03 DIAGNOSIS — Z951 Presence of aortocoronary bypass graft: Secondary | ICD-10-CM | POA: Diagnosis not present

## 2016-12-03 DIAGNOSIS — Z85528 Personal history of other malignant neoplasm of kidney: Secondary | ICD-10-CM | POA: Diagnosis not present

## 2016-12-03 DIAGNOSIS — M899 Disorder of bone, unspecified: Secondary | ICD-10-CM | POA: Diagnosis not present

## 2016-12-03 DIAGNOSIS — M549 Dorsalgia, unspecified: Secondary | ICD-10-CM

## 2016-12-03 DIAGNOSIS — A419 Sepsis, unspecified organism: Secondary | ICD-10-CM

## 2016-12-03 DIAGNOSIS — K219 Gastro-esophageal reflux disease without esophagitis: Secondary | ICD-10-CM | POA: Diagnosis present

## 2016-12-03 DIAGNOSIS — C7951 Secondary malignant neoplasm of bone: Secondary | ICD-10-CM | POA: Diagnosis not present

## 2016-12-03 DIAGNOSIS — Z7982 Long term (current) use of aspirin: Secondary | ICD-10-CM

## 2016-12-03 DIAGNOSIS — Z515 Encounter for palliative care: Secondary | ICD-10-CM | POA: Diagnosis present

## 2016-12-03 DIAGNOSIS — R7989 Other specified abnormal findings of blood chemistry: Secondary | ICD-10-CM | POA: Diagnosis not present

## 2016-12-03 DIAGNOSIS — E785 Hyperlipidemia, unspecified: Secondary | ICD-10-CM | POA: Diagnosis present

## 2016-12-03 DIAGNOSIS — I10 Essential (primary) hypertension: Secondary | ICD-10-CM | POA: Diagnosis present

## 2016-12-03 DIAGNOSIS — L0291 Cutaneous abscess, unspecified: Secondary | ICD-10-CM

## 2016-12-03 DIAGNOSIS — Z9049 Acquired absence of other specified parts of digestive tract: Secondary | ICD-10-CM | POA: Diagnosis not present

## 2016-12-03 DIAGNOSIS — Z77090 Contact with and (suspected) exposure to asbestos: Secondary | ICD-10-CM | POA: Diagnosis present

## 2016-12-03 DIAGNOSIS — Z886 Allergy status to analgesic agent status: Secondary | ICD-10-CM

## 2016-12-03 DIAGNOSIS — D649 Anemia, unspecified: Secondary | ICD-10-CM | POA: Diagnosis present

## 2016-12-03 DIAGNOSIS — C384 Malignant neoplasm of pleura: Secondary | ICD-10-CM | POA: Diagnosis not present

## 2016-12-03 DIAGNOSIS — C3490 Malignant neoplasm of unspecified part of unspecified bronchus or lung: Secondary | ICD-10-CM | POA: Diagnosis not present

## 2016-12-03 DIAGNOSIS — E440 Moderate protein-calorie malnutrition: Secondary | ICD-10-CM | POA: Diagnosis present

## 2016-12-03 DIAGNOSIS — J181 Lobar pneumonia, unspecified organism: Secondary | ICD-10-CM | POA: Diagnosis not present

## 2016-12-03 DIAGNOSIS — K5900 Constipation, unspecified: Secondary | ICD-10-CM | POA: Diagnosis present

## 2016-12-03 DIAGNOSIS — Y95 Nosocomial condition: Secondary | ICD-10-CM | POA: Diagnosis present

## 2016-12-03 DIAGNOSIS — R0682 Tachypnea, not elsewhere classified: Secondary | ICD-10-CM | POA: Diagnosis not present

## 2016-12-03 DIAGNOSIS — Z905 Acquired absence of kidney: Secondary | ICD-10-CM

## 2016-12-03 DIAGNOSIS — C3432 Malignant neoplasm of lower lobe, left bronchus or lung: Secondary | ICD-10-CM

## 2016-12-03 DIAGNOSIS — E872 Acidosis: Secondary | ICD-10-CM | POA: Diagnosis not present

## 2016-12-03 DIAGNOSIS — Z87891 Personal history of nicotine dependence: Secondary | ICD-10-CM

## 2016-12-03 DIAGNOSIS — C801 Malignant (primary) neoplasm, unspecified: Secondary | ICD-10-CM | POA: Diagnosis not present

## 2016-12-03 DIAGNOSIS — Z833 Family history of diabetes mellitus: Secondary | ICD-10-CM

## 2016-12-03 DIAGNOSIS — N179 Acute kidney failure, unspecified: Secondary | ICD-10-CM | POA: Diagnosis not present

## 2016-12-03 DIAGNOSIS — Z955 Presence of coronary angioplasty implant and graft: Secondary | ICD-10-CM | POA: Diagnosis not present

## 2016-12-03 DIAGNOSIS — I824Z3 Acute embolism and thrombosis of unspecified deep veins of distal lower extremity, bilateral: Secondary | ICD-10-CM | POA: Diagnosis present

## 2016-12-03 DIAGNOSIS — J189 Pneumonia, unspecified organism: Principal | ICD-10-CM | POA: Diagnosis present

## 2016-12-03 DIAGNOSIS — J9601 Acute respiratory failure with hypoxia: Secondary | ICD-10-CM | POA: Diagnosis not present

## 2016-12-03 DIAGNOSIS — E1151 Type 2 diabetes mellitus with diabetic peripheral angiopathy without gangrene: Secondary | ICD-10-CM

## 2016-12-03 DIAGNOSIS — I82403 Acute embolism and thrombosis of unspecified deep veins of lower extremity, bilateral: Secondary | ICD-10-CM | POA: Diagnosis not present

## 2016-12-03 DIAGNOSIS — I469 Cardiac arrest, cause unspecified: Secondary | ICD-10-CM | POA: Diagnosis not present

## 2016-12-03 DIAGNOSIS — R918 Other nonspecific abnormal finding of lung field: Secondary | ICD-10-CM | POA: Diagnosis not present

## 2016-12-03 DIAGNOSIS — R000 Tachycardia, unspecified: Secondary | ICD-10-CM | POA: Diagnosis present

## 2016-12-03 DIAGNOSIS — I471 Supraventricular tachycardia: Secondary | ICD-10-CM | POA: Diagnosis not present

## 2016-12-03 DIAGNOSIS — Z86711 Personal history of pulmonary embolism: Secondary | ICD-10-CM | POA: Diagnosis not present

## 2016-12-03 DIAGNOSIS — C7800 Secondary malignant neoplasm of unspecified lung: Secondary | ICD-10-CM | POA: Diagnosis not present

## 2016-12-03 DIAGNOSIS — J90 Pleural effusion, not elsewhere classified: Secondary | ICD-10-CM | POA: Diagnosis present

## 2016-12-03 DIAGNOSIS — G9341 Metabolic encephalopathy: Secondary | ICD-10-CM | POA: Diagnosis present

## 2016-12-03 DIAGNOSIS — E860 Dehydration: Secondary | ICD-10-CM | POA: Diagnosis present

## 2016-12-03 DIAGNOSIS — Z8249 Family history of ischemic heart disease and other diseases of the circulatory system: Secondary | ICD-10-CM

## 2016-12-03 DIAGNOSIS — Z981 Arthrodesis status: Secondary | ICD-10-CM | POA: Diagnosis not present

## 2016-12-03 DIAGNOSIS — C799 Secondary malignant neoplasm of unspecified site: Secondary | ICD-10-CM

## 2016-12-03 DIAGNOSIS — J910 Malignant pleural effusion: Secondary | ICD-10-CM | POA: Diagnosis not present

## 2016-12-03 DIAGNOSIS — Z6825 Body mass index (BMI) 25.0-25.9, adult: Secondary | ICD-10-CM

## 2016-12-03 DIAGNOSIS — Z808 Family history of malignant neoplasm of other organs or systems: Secondary | ICD-10-CM

## 2016-12-03 DIAGNOSIS — Z888 Allergy status to other drugs, medicaments and biological substances status: Secondary | ICD-10-CM

## 2016-12-03 DIAGNOSIS — R590 Localized enlarged lymph nodes: Secondary | ICD-10-CM | POA: Diagnosis present

## 2016-12-03 DIAGNOSIS — I2510 Atherosclerotic heart disease of native coronary artery without angina pectoris: Secondary | ICD-10-CM | POA: Diagnosis not present

## 2016-12-03 DIAGNOSIS — N200 Calculus of kidney: Secondary | ICD-10-CM | POA: Diagnosis not present

## 2016-12-03 DIAGNOSIS — G8929 Other chronic pain: Secondary | ICD-10-CM | POA: Diagnosis present

## 2016-12-03 DIAGNOSIS — M8588 Other specified disorders of bone density and structure, other site: Secondary | ICD-10-CM | POA: Diagnosis not present

## 2016-12-03 DIAGNOSIS — M898X9 Other specified disorders of bone, unspecified site: Secondary | ICD-10-CM

## 2016-12-03 DIAGNOSIS — R0989 Other specified symptoms and signs involving the circulatory and respiratory systems: Secondary | ICD-10-CM | POA: Diagnosis present

## 2016-12-03 DIAGNOSIS — G9340 Encephalopathy, unspecified: Secondary | ICD-10-CM | POA: Diagnosis not present

## 2016-12-03 DIAGNOSIS — Z79899 Other long term (current) drug therapy: Secondary | ICD-10-CM

## 2016-12-03 LAB — PROCALCITONIN: Procalcitonin: 0.18 ng/mL

## 2016-12-03 LAB — COMPREHENSIVE METABOLIC PANEL
ALBUMIN: 2.3 g/dL — AB (ref 3.5–5.0)
ALT: 24 U/L (ref 17–63)
AST: 32 U/L (ref 15–41)
Alkaline Phosphatase: 87 U/L (ref 38–126)
Anion gap: 14 (ref 5–15)
BUN: 28 mg/dL — AB (ref 6–20)
CHLORIDE: 100 mmol/L — AB (ref 101–111)
CO2: 29 mmol/L (ref 22–32)
Calcium: 13.4 mg/dL (ref 8.9–10.3)
Creatinine, Ser: 1.48 mg/dL — ABNORMAL HIGH (ref 0.61–1.24)
GFR calc Af Amer: 52 mL/min — ABNORMAL LOW (ref >60)
GFR calc non Af Amer: 45 mL/min — ABNORMAL LOW (ref >60)
Glucose, Bld: 102 mg/dL — ABNORMAL HIGH (ref 65–99)
POTASSIUM: 4.3 mmol/L (ref 3.5–5.1)
SODIUM: 143 mmol/L (ref 135–145)
Total Bilirubin: 0.6 mg/dL (ref 0.3–1.2)
Total Protein: 6.1 g/dL — ABNORMAL LOW (ref 6.5–8.1)

## 2016-12-03 LAB — GLUCOSE, CAPILLARY: Glucose-Capillary: 138 mg/dL — ABNORMAL HIGH (ref 65–99)

## 2016-12-03 LAB — CBC WITH DIFFERENTIAL/PLATELET
BASOS ABS: 0.2 10*3/uL — AB (ref 0.0–0.1)
BASOS PCT: 1 %
EOS ABS: 0.2 10*3/uL (ref 0.0–0.7)
Eosinophils Relative: 1 %
HEMATOCRIT: 39.9 % (ref 39.0–52.0)
Hemoglobin: 12.8 g/dL — ABNORMAL LOW (ref 13.0–17.0)
LYMPHS PCT: 7 %
Lymphs Abs: 1.4 10*3/uL (ref 0.7–4.0)
MCH: 30.5 pg (ref 26.0–34.0)
MCHC: 32.1 g/dL (ref 30.0–36.0)
MCV: 95 fL (ref 78.0–100.0)
Monocytes Absolute: 1.8 10*3/uL — ABNORMAL HIGH (ref 0.1–1.0)
Monocytes Relative: 9 %
NEUTROS PCT: 82 %
Neutro Abs: 16.7 10*3/uL — ABNORMAL HIGH (ref 1.7–7.7)
PLATELETS: 330 10*3/uL (ref 150–400)
RBC: 4.2 MIL/uL — ABNORMAL LOW (ref 4.22–5.81)
RDW: 13.6 % (ref 11.5–15.5)
WBC: 20.3 10*3/uL — ABNORMAL HIGH (ref 4.0–10.5)

## 2016-12-03 LAB — URINALYSIS, ROUTINE W REFLEX MICROSCOPIC
Bilirubin Urine: NEGATIVE
Glucose, UA: NEGATIVE mg/dL
Hgb urine dipstick: NEGATIVE
Ketones, ur: NEGATIVE mg/dL
LEUKOCYTES UA: NEGATIVE
NITRITE: NEGATIVE
PH: 5 (ref 5.0–8.0)
Protein, ur: NEGATIVE mg/dL
Specific Gravity, Urine: 1.03 (ref 1.005–1.030)

## 2016-12-03 LAB — I-STAT CG4 LACTIC ACID, ED
Lactic Acid, Venous: 3.21 mmol/L (ref 0.5–1.9)
Lactic Acid, Venous: 2.93 mmol/L (ref 0.5–1.9)
Lactic Acid, Venous: 1.87 mmol/L (ref 0.5–1.9)

## 2016-12-03 LAB — INFLUENZA PANEL BY PCR (TYPE A & B)
INFLAPCR: POSITIVE — AB
INFLBPCR: NEGATIVE

## 2016-12-03 LAB — BRAIN NATRIURETIC PEPTIDE: Pro B Natriuretic peptide (BNP): 245 pg/mL — ABNORMAL HIGH (ref 0.0–100.0)

## 2016-12-03 LAB — MRSA PCR SCREENING: MRSA by PCR: NEGATIVE

## 2016-12-03 MED ORDER — ACETAMINOPHEN 650 MG RE SUPP: 650.0000 mg | Freq: Four times a day (QID) | RECTAL | Status: DC | PRN

## 2016-12-03 MED ORDER — NITROGLYCERIN 0.4 MG SL SUBL: 0.4000 mg | SUBLINGUAL_TABLET | SUBLINGUAL | Status: DC | PRN

## 2016-12-03 MED ORDER — SODIUM CHLORIDE 0.9 % IV BOLUS (SEPSIS)
1000.0000 mL | Freq: Once | INTRAVENOUS | Status: AC
  Administered 2016-12-03 (×2): 1000 mL via INTRAVENOUS

## 2016-12-03 MED ORDER — ATORVASTATIN CALCIUM 80 MG PO TABS
80.0000 mg | ORAL_TABLET | Freq: Every day | ORAL | Status: DC
  Administered 2016-12-04 – 2016-12-05 (×2): 80 mg via ORAL
  Filled 2016-12-03 (×2): qty 1

## 2016-12-03 MED ORDER — METHYLPREDNISOLONE SODIUM SUCC 125 MG IJ SOLR
80.0000 mg | Freq: Once | INTRAMUSCULAR | Status: AC
Start: 2016-12-03 — End: 2016-12-03
  Administered 2016-12-03: 80 mg via INTRAVENOUS
  Filled 2016-12-03: qty 2

## 2016-12-03 MED ORDER — TRAZODONE HCL 50 MG PO TABS
25.0000 mg | ORAL_TABLET | Freq: Every evening | ORAL | Status: DC | PRN
  Administered 2016-12-05: 25 mg via ORAL
  Filled 2016-12-03: qty 1

## 2016-12-03 MED ORDER — ENOXAPARIN SODIUM 80 MG/0.8ML ~~LOC~~ SOLN
1.0000 mg/kg | Freq: Two times a day (BID) | SUBCUTANEOUS | Status: DC
  Administered 2016-12-03 – 2016-12-05 (×4): 75 mg via SUBCUTANEOUS
  Filled 2016-12-03 (×4): qty 0.8

## 2016-12-03 MED ORDER — POLYETHYLENE GLYCOL 3350 17 G PO PACK: 17.0000 g | PACK | Freq: Every day | ORAL | Status: DC | PRN

## 2016-12-03 MED ORDER — ASPIRIN EC 81 MG PO TBEC
81.0000 mg | DELAYED_RELEASE_TABLET | Freq: Every day | ORAL | Status: DC
  Administered 2016-12-03 – 2016-12-05 (×3): 81 mg via ORAL
  Filled 2016-12-03 (×3): qty 1

## 2016-12-03 MED ORDER — SODIUM CHLORIDE 0.9 % IV BOLUS (SEPSIS)
1000.0000 mL | Freq: Once | INTRAVENOUS | Status: AC
  Administered 2016-12-03: 1000 mL via INTRAVENOUS

## 2016-12-03 MED ORDER — SODIUM CHLORIDE 0.9 % IV BOLUS (SEPSIS)
500.0000 mL | Freq: Once | INTRAVENOUS | Status: AC
  Administered 2016-12-03: 500 mL via INTRAVENOUS

## 2016-12-03 MED ORDER — ONDANSETRON HCL 4 MG PO TABS: 4.0000 mg | ORAL_TABLET | Freq: Four times a day (QID) | ORAL | Status: DC | PRN

## 2016-12-03 MED ORDER — VANCOMYCIN HCL IN DEXTROSE 1-5 GM/200ML-% IV SOLN: 1000.0000 mg | Freq: Once | INTRAVENOUS | Status: DC

## 2016-12-03 MED ORDER — METOPROLOL SUCCINATE ER 50 MG PO TB24
50.0000 mg | ORAL_TABLET | Freq: Every day | ORAL | Status: DC
  Administered 2016-12-04 – 2016-12-05 (×2): 50 mg via ORAL
  Filled 2016-12-03 (×3): qty 1

## 2016-12-03 MED ORDER — MORPHINE SULFATE (PF) 2 MG/ML IV SOLN
1.0000 mg | INTRAVENOUS | Status: DC | PRN
  Administered 2016-12-03: 1 mg via INTRAVENOUS
  Administered 2016-12-04: 2 mg via INTRAVENOUS
  Filled 2016-12-03 (×2): qty 1

## 2016-12-03 MED ORDER — VANCOMYCIN HCL 10 G IV SOLR
1500.0000 mg | Freq: Once | INTRAVENOUS | Status: AC
  Administered 2016-12-03: 1500 mg via INTRAVENOUS
  Filled 2016-12-03: qty 1500

## 2016-12-03 MED ORDER — IOPAMIDOL (ISOVUE-300) INJECTION 61%
INTRAVENOUS | Status: AC
  Administered 2016-12-03: 60 mL
  Filled 2016-12-03: qty 75

## 2016-12-03 MED ORDER — LORAZEPAM 2 MG/ML IJ SOLN
INTRAMUSCULAR | Status: AC
Start: 2016-12-03 — End: 2016-12-04
  Filled 2016-12-03: qty 1

## 2016-12-03 MED ORDER — VANCOMYCIN HCL IN DEXTROSE 750-5 MG/150ML-% IV SOLN
750.0000 mg | Freq: Two times a day (BID) | INTRAVENOUS | Status: DC
  Administered 2016-12-03 – 2016-12-04 (×2): 750 mg via INTRAVENOUS
  Filled 2016-12-03 (×2): qty 150

## 2016-12-03 MED ORDER — SODIUM CHLORIDE 0.9% FLUSH
3.0000 mL | Freq: Two times a day (BID) | INTRAVENOUS | Status: DC
  Administered 2016-12-03 – 2016-12-06 (×7): 3 mL via INTRAVENOUS

## 2016-12-03 MED ORDER — OMEGA-3-ACID ETHYL ESTERS 1 G PO CAPS
1.0000 g | ORAL_CAPSULE | Freq: Two times a day (BID) | ORAL | Status: DC
  Administered 2016-12-04 – 2016-12-05 (×4): 1 g via ORAL
  Filled 2016-12-03 (×4): qty 1

## 2016-12-03 MED ORDER — DEXTROSE 5 % IV SOLN
2.0000 g | Freq: Once | INTRAVENOUS | Status: AC
  Administered 2016-12-03: 2 g via INTRAVENOUS
  Filled 2016-12-03: qty 2

## 2016-12-03 MED ORDER — INSULIN ASPART 100 UNIT/ML ~~LOC~~ SOLN
0.0000 [IU] | Freq: Three times a day (TID) | SUBCUTANEOUS | Status: DC
  Administered 2016-12-03: 1 [IU] via SUBCUTANEOUS

## 2016-12-03 MED ORDER — DEXTROSE 5 % IV SOLN
2.0000 g | INTRAVENOUS | Status: DC
  Administered 2016-12-04 – 2016-12-06 (×3): 2 g via INTRAVENOUS
  Filled 2016-12-03 (×4): qty 2

## 2016-12-03 MED ORDER — LORAZEPAM 2 MG/ML IJ SOLN
0.5000 mg | Freq: Once | INTRAMUSCULAR | Status: AC
  Administered 2016-12-03: 0.5 mg via INTRAVENOUS

## 2016-12-03 MED ORDER — ACETAMINOPHEN 325 MG PO TABS
650.0000 mg | ORAL_TABLET | Freq: Four times a day (QID) | ORAL | Status: DC | PRN
  Administered 2016-12-04 – 2016-12-05 (×3): 650 mg via ORAL
  Filled 2016-12-03 (×3): qty 2

## 2016-12-03 MED ORDER — ONDANSETRON HCL 4 MG/2ML IJ SOLN
4.0000 mg | Freq: Four times a day (QID) | INTRAMUSCULAR | Status: DC | PRN
  Administered 2016-12-05: 4 mg via INTRAVENOUS
  Filled 2016-12-03: qty 2

## 2016-12-03 MED ORDER — HYDROCODONE-ACETAMINOPHEN 5-325 MG PO TABS
1.0000 | ORAL_TABLET | Freq: Once | ORAL | Status: AC
Start: 2016-12-03 — End: 2016-12-03
  Administered 2016-12-03: 1 via ORAL
  Filled 2016-12-03: qty 1

## 2016-12-03 MED ORDER — IPRATROPIUM-ALBUTEROL 0.5-2.5 (3) MG/3ML IN SOLN
3.0000 mL | Freq: Once | RESPIRATORY_TRACT | Status: AC
  Administered 2016-12-03: 3 mL via RESPIRATORY_TRACT
  Filled 2016-12-03: qty 3

## 2016-12-03 NOTE — ED Notes (Addendum)
Respiratory called to initiate Bipap on patient. Phlebotomy at bedside to collect 2nd lactic.

## 2016-12-03 NOTE — ED Notes (Signed)
Attempted report x2, Floor RN stated she will call back in 5 mins

## 2016-12-03 NOTE — Telephone Encounter (Signed)
Please advise 

## 2016-12-03 NOTE — ED Notes (Signed)
Hospitalist at bedside 

## 2016-12-03 NOTE — H&P (Signed)
History and Physical    Curtis Galloway:[id number] DOB: September 04, 1941 DOA: 12/06/2016  PCP: Binnie Rail, MD   Patient coming from: home  Chief Complaint: progressive SOB and weakness  HPI: Curtis Galloway is a 76 y.o. male with medical history significant CAD, s/p CABG, HTN, GERD who was recently admitted to the [facility] hospital with Influenza A and completed course of Tamiflu. He was discharged home on supplemental O2. 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 The family contacted EMS and he was brought to the ED for evaluation  ED Course: in the ED his VS showed heart rate of 105, temperature 90.7 point blood pressure 112/76 and oxygen saturation was 89% on room air Patient had increased work of breathing and was agitated with increasing shortness of breath even after nebulizer treatment. 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   Ambulatory Status: independent  Past Medical History:  Diagnosis Date  . Abnormal nuclear stress test 11/16/2013  . Coronary artery disease    Dr. Johnsie Cancel,  stent to LAD  . GERD (gastroesophageal reflux disease)   . Hyperlipidemia   . 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 Hypertension     Past Surgical History:  Procedure Laterality Date  . APPENDECTOMY    . CARDIAC CATHETERIZATION  11/01/2011   Dr Johnsie Cancel  . CARDIAC CATHETERIZATION N/A 03/16/2015   Procedure: Left Heart Cath and Cors/Grafts Angiography;  Surgeon: Josue Hector, MD;  Location: [facility];  Service: Cardiovascular;  Laterality: N/A;  . CERVICAL LAMINECTOMY     x 1  . colonoscopy with polypectomy  2014   Dr Paulita Fujita  . colonscopy  2006   Dr. Delfin Edis; negative  . CORONARY ANGIOPLASTY WITH STENT PLACEMENT  1990's; 11/15/2013   "today's make a total of 3" (11/15/2013)  . CORONARY ANGIOPLASTY WITH STENT PLACEMENT  11/15/13   Promus to prox. LAD  . CORONARY ARTERY BYPASS GRAFT  2000   Dr. Roxan Hockey 2001  . KNEE SURGERY  3-4 surgeries   bone moved from hip to under knee cap  . LEFT HEART CATHETERIZATION WITH CORONARY/GRAFT ANGIOGRAM  11/01/2011   Procedure: LEFT HEART CATHETERIZATION WITH Beatrix Fetters;  Surgeon: Josue Hector, MD;  Location: [facility] C[facility] LAB;  Service: Cardiovascular;;  . LEFT HEART CATHETERIZATION WITH CORONARY/GRAFT ANGIOGRAM N/A 11/15/2013   Procedure: LEFT HEART CATHETERIZATION WITH Beatrix Fetters;  Surgeon: Burnell Blanks, MD;  Location: [facility] CATH LAB;  Service: Cardiovascular;  Laterality: N/A;  . LUMBAR FUSION  06/08/2013   [place]  . LUMBAR LAMINECTOMY     x 4  . 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 TONSILLECTOMY      Social History   Social History  . Marital status: Married    Spouse name: N/A  . Number of children: N/A  . Years of education: N/A   Occupational History  . Not on file.   Social  History Main Topics  . Smoking status: Former Smoker    Packs/day: 1.00    Years: 38.00    Types: Cigarettes    Quit date: 10/01/1996  . Smokeless tobacco: Never Used     Comment: smoked 1960-1998, up 1 ppd  . Alcohol use No  . Drug use: No  . Sexual activity: Yes   Other Topics Concern  . Not on file   Social History Narrative   Retired   Married   Former smoker   Alcohol use-no   Regular exercise-no    Allergies  Allergen Reactions  . Codeine Nausea Only    severe  . Tramadol Nausea Only    severe    Family History  Problem Relation Age of Onset  . Heart attack Father 76  . Diabetes Mother   . Coronary artery disease Mother   . Heart attack Brother 72  . Heart attack Brother     in his 45s  . Cancer Maternal Grandmother     uterine cancer  . Coronary artery disease Other     M & P aunts/uncles  . Stroke Neg Hx     Prior to Admission medications   Medication Sig Start Date End Date Taking? Authorizing Provider  aspirin 325 MG tablet Take 325 mg by mouth daily.    Historical Provider, MD  atorvastatin (LIPITOR) 80 MG tablet Take 1 tablet (80 mg total) by mouth at bedtime. 10/18/16   Josue Hector, MD  gabapentin (NEURONTIN) 100 MG capsule Take 100 mg by mouth 3 (three) times daily. 11/20/16   Historical Provider, MD  HYDROcodone-acetaminophen (NORCO/VICODIN) 5-325 MG tablet Take 1 tablet by mouth every 8 (eight) hours as needed. 12/02/16   Binnie Rail, MD  ipratropium-albuterol (DUONEB) 0.5-2.5 (3) MG/3ML SOLN Take 3 mLs by nebulization every 6 (six) hours as needed. 11/25/16   Henreitta Leber, MD  metoprolol succinate (TOPROL XL) 25 MG 24 hr tablet Take 1 tablet (25 mg total) by mouth daily. Take with metoprolol 50 mg 10/18/16   Josue Hector, MD  metoprolol succinate (TOPROL-XL) 50 MG 24 hr tablet Take 1 tablet (50 mg total) by mouth daily. Take with Metoprolol 25 mg tablet 10/18/16   Josue Hector, MD  Multiple Vitamin (MULTIVITAMIN) tablet Take 1 tablet by mouth  daily.      Historical Provider, MD  nitroGLYCERIN (NITROSTAT) 0.4 MG SL tablet Place 1 tablet (0.4 mg total) under the tongue every 5 (five) minutes as needed for chest pain. 03/15/15   Josue Hector, MD  Omega-3 Fatty Acids (FISH OIL PO) Take 1 capsule by mouth daily. Mega Red brand    Historical Provider, MD    Physical Exam: Vitals:   12/18/2016 1345 12/01/2016 1400 12/13/2016 1430 12/01/2016 1515  BP: 169/87 (!) 184/124 174/92 150/73  Pulse: 105 112 117 107  Resp: '15 26 17 13  '$ Temp:      TempSrc:      SpO2: 98% (!) 89% 100% 99%  General: Appears calm and comfortable Eyes: PERRLA, EOMI, normal lids, iris ENT:  grossly normal hearing, lips & tongue, mucous membranes moist and intact Neck: no lymphoadenopathy, masses or thyromegaly Cardiovascular: RRR, no m/r/g. No JVD, carotid bruits. Positive LE edema, Right > Left.  Respiratory: bilateral rales and rhonchi. Normal respiratory effort. No accessory muscle use observed Abdomen: soft, non-tender, non-distended, no organomegaly or masses appreciated. BS present in all quadrants Skin: no rash, ulcers or induration seen on limited exam Musculoskeletal: grossly normal tone BUE/BLE, good ROM, no bony abnormality or joint deformities observed Psychiatric: grossly normal mood and affect, speech fluent and appropriate, alert and oriented x3 Neurologic: CN II-XII grossly intact, moves all extremities in coordinated fashion, sensation intact  Labs on Admission: I have personally reviewed following labs and imaging studies  CBC, BMP  GFR: Estimated Creatinine Clearance: 43.8 mL/min (by C-G formula based on SCr of 1.48 mg/dL (H)).   Creatinine Clearance: Estimated Creatinine Clearance: 43.8 mL/min (by C-G formula based on SCr of 1.48 mg/dL (H)).  Radiological Exams on Admission: Dg Chest 2 View  Result Date: 12/06/2016 CLINICAL DATA:  Congestion, cough, history of recent pneumonia, former smoking history EXAM: CHEST  2 VIEW COMPARISON:   Chest x-ray of 11/22/2016 FINDINGS: Prominent coarse interstitial markings again are noted bilaterally left much greater than right with bilateral pleural effusions. There is now and opacity within the left lung apex which has developed since the chest x-ray 11/22/2016, therefore most consistent with loculated effusion. CT the chest is recommended to evaluate in more detail with IV contrast media. Mediastinal and hilar contours are unchanged and the heart is within normal limits in size. Median sternotomy sutures are noted from prior CABG. 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 IMPRESSION: 1. Worsening of coarse interstitial lung markings left-greater-than-right. Possible pneumonia as viral pneumonia versus interstitial edema with effusions. 2. New opacity in the left lung apex most consistent with loculated effusion in view of the rapid development. Recommend CT of the chest with IV contrast media. 3. Changes of asbestos related disease. Electronically Signed   By: Ivar Drape M.D.   On: 12/04/2016 08:24   Ct Chest W Contrast  Result Date: 12/15/2016 CLINICAL DATA:  Productive cough, worsening short of breath EXAM: CT CHEST WITH CONTRAST TECHNIQUE: Multidetector CT imaging of the chest was performed during intravenous contrast administration. CONTRAST:  5m ISOVUE-300 IOPAMIDOL (ISOVUE-300) INJECTION 61% COMPARISON:  CT 04/11/2016 FINDINGS: Cardiovascular: Post CABG.  No pericardial fluid. Within the RIGHT lower lobe pulmonary artery there is a thin tubular branching filling defect (image 74 through 79 series 201). There is extensive hilar adenopathy at this same level with external compression of the pulmonary artery (image 75, series 601) Mediastinum/Nodes: No axillary or supraclavicular adenopathy. Subcarinal lymph node measures 22 mm. RIGHT lower paratracheal node measures 12 mm. RIGHT hilar nodal complex measures 3.2 cm. RIGHT infrahilar nodal complex measures 2.7 cm (image 35 series 6). The  mild perihilar thickening on the LEFT Lungs/Pleura: There are bilateral small effusions. Effusions extend along the oblique fissures and partially loculated fluid at the LEFT lung apex. There is interstitial thickening extending from the hilum within the LEFT upper lobe and LEFT lower lobe. Within the RIGHT lung scattered interstitial thickening and peripheral nodularity. No discrete measurable nodule. Calcification over the RIGHT diaphragm. Upper Abdomen: Limited view of the liver, kidneys, pancreas are unremarkable. Normal adrenal glands. Musculoskeletal: Multiple rounded lucent lesions within the spine. For example multiple 5 to 7 mm lesions on image 132, series  601 in the lower thoracic spines. Similar lesions in the upper thoracic spine (image 30, series 201) measuring 9 mm. Lytic lesion in the LEFT aspect of the manubrium. IMPRESSION: 1. Diffuse interstitial thickening in the LEFT lung with loculated fluid at the LEFT lung apex. 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 2. RIGHT hilar adenopathy and infrahilar nodal thickening as well as a large mediastinal nodes is concerning for metastatic adenopathy. 3. Small filling defect within the RIGHT lower lobe pulmonary nodule consistent with PULMONARY EMBOLISM. This etiology of embolism may be local compression of the artery by the RIGHT infrahilar adenopathy. Cannot exclude peripheral deep venous thrombosis. 4. 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 5. Patient has a history of LEFT nephrectomy. Differential for malignancy would include bronchogenic carcinoma versus metastatic renal cell carcinoma. Findings conveyed toLiu, MDon 12/02/2016  at14:48. Electronically Signed   By: Suzy Bouchard M.D.   On: 12/04/2016 14:52    EKG: Independently reviewed - Sinus tachy, no acute ST -TW changes, PAC's present Assessment/Plan Principal Problem:   Acute respiratory failure with hypoxia (HCC) Active Problems:    Essential hypertension, benign   Coronary atherosclerosis of native coronary artery, 11/15/13 PTCA/DES prox LAD, [facility]   GERD   Diabetes mellitus with peripheral vascular disease (HCC)   Chronic back pain   Pulmonary embolism (HCC)   Lung cancer (HCC)    Acute hypoxic respiratory failure - requiring BIPAP, most likely d/t underlying new ly discovered by chest CT malignant lung disease and PE superimposed on underlying PNA Continue BiPAP per respiratory, Nebulizer treatments Doubt patient has sepsis and PNA, but given his recent hospital admission and respiratory failure on presentation, would favor antibiotic treatment at this point and early discontinuation if no further signs of PNA noted Will obtain Procalcitonin  Newly diagnosed lung CA - unclear metastatic vs primary Will obtain abdomen and pelvic CT, IT consult for possible biopsy  when patient's respiratory status stabilizes and notify oncology when result is available  PE - this could be secondary to to malignancy, elevated Lactic acid is most likely secondary to PE and malignancy Initiate full dose Lovenox therapy  DM type II - most recent available HgbA1C is from April 2017 - 6.4% Hold Metformin while hospitalized Continue diabetic diet, monitor FSBS QACHS, add sliding scale insulin  CAD, s/p CABG in 2015 No anginal complaints, continue home meds and reduce Aspirin to 81 mg given current Lovenox therapy for PE  Hyperlipidemia  Continue Lipiptor  DVT prophylaxis: Lovenox Code Status: Partial code Family Communication: at bedside Disposition Plan: stepdown Consults called: none Admission status: innpatient   York Grice, [place] Pager: [phone] [facility]  If 7PM-7AM, please contact night-coverage [email or web address] Password [email or web address]  12/27/2016, 3:31 PM

## 2016-12-03 NOTE — Telephone Encounter (Signed)
I spoke to her earlier today at lunch - he is in the hospital.

## 2016-12-03 NOTE — Progress Notes (Signed)
Patient transported to CT and back to room E44 without any apparent complications.

## 2016-12-03 NOTE — ED Notes (Signed)
Pt agitated again; bipap to be initiated; pt to go to CT.

## 2016-12-03 NOTE — ED Notes (Signed)
Attempted report x1. 

## 2016-12-03 NOTE — ED Triage Notes (Signed)
Pt presents for evaluation of ongoing productive cough and worsening SOB. Seen by PCP yesterday for same, at time did not have results of chest xray. Pt positive for influenza 2/23. Pt AxO x4. RA sats 89% per EMS.

## 2016-12-03 NOTE — ED Provider Notes (Signed)
Curtis Galloway Provider Note   CSN: [id number] Arrival date & time: 12/15/2016  1024     History   Chief Complaint Chief Complaint  Patient presents with  . Shortness of Breath    HPI Curtis Galloway is a 76 y.o. male.  HPI  76 year old male with a significant past medical history of hypertension, GERD, history of CAD, neuropathy presents today with complaints of shortness of breath cough and fever.  Patient was most recently discharged from the hospital on 11/25/2016 after acute respiratory failure secondary to influenza with underlying emphysema, asbestos related lung disease.  Patient had IV steroids duo nebs and Tamiflu while in the hospital.  Patient discharged home with duo nebs, prednisone and oxygen.  Patient did not previously require oxygen before hospital admission.  Wife notes that patient continues to worsen at home, and has become slightly altered.  Patient was started on gabapentin and wonders if this was causing it, notes last dose was yesterday.  Patient was brought via EMS, received 2 breathing treatments in route.  Patient reports he still feels short of breath, fatigue.  Most of patient's history was obtained from his wife.    Past Medical History:  Diagnosis Date  . Abnormal nuclear stress test 11/16/2013  . Coronary artery disease    Dr. Johnsie Cancel,  stent to LAD  . GERD (gastroesophageal reflux disease)   . Hyperlipidemia   . Hypernephroma [facility]) 2004   Dr Rosana Hoes  . Hypertension     Patient Active Problem List   Diagnosis Date Noted  . SOB (shortness of breath) 12/02/2016  . Leg edema 12/02/2016  . Neck pain 12/02/2016  . Sepsis ([place]) 11/22/2016  . Acute respiratory failure with hypoxia ([place])   . Influenza A   . Demand ischemia ([place])   . Coronary artery disease of bypass graft of native heart with stable angina pectoris ([place])   . Chronic back pain   . Bilateral lumbar radiculopathy 07/08/2014  . Diabetes mellitus with peripheral vascular disease ([place])  12/06/2013  . Abnormal nuclear stress test, 11/10/13 11/16/2013  . Adenomatous polyp 04/06/2013  . Unstable angina ([place]) 11/02/2011  . HYPERNEPHROMA 10/11/2009  . Elevated lipids 10/11/2009  . Essential hypertension, benign 10/11/2009  . Coronary atherosclerosis of native coronary artery, 11/15/13 PTCA/DES prox LAD, [place] 10/11/2009  . GERD 10/11/2009    Past Surgical History:  Procedure Laterality Date  . APPENDECTOMY    . CARDIAC CATHETERIZATION  11/01/2011   Dr Johnsie Cancel  . CARDIAC CATHETERIZATION N/A 03/16/2015   Procedure: Left Heart Cath and Cors/Grafts Angiography;  Surgeon: Josue Hector, MD;  Location: [facility];  Service: Cardiovascular;  Laterality: N/A;  . CERVICAL LAMINECTOMY     x 1  . colonoscopy with polypectomy  2014   Dr Paulita Fujita  . colonscopy  2006   Dr. Delfin Edis; negative  . CORONARY ANGIOPLASTY WITH STENT PLACEMENT  1990's; 11/15/2013   "today's make a total of 3" (11/15/2013)  . CORONARY ANGIOPLASTY WITH STENT PLACEMENT  11/15/13   Promus to prox. LAD  . CORONARY ARTERY BYPASS GRAFT  2000   Dr. Roxan Hockey 2001  . KNEE SURGERY  3-4 surgeries   bone moved from hip to under knee cap  . LEFT HEART CATHETERIZATION WITH CORONARY/GRAFT ANGIOGRAM  11/01/2011   Procedure: LEFT HEART CATHETERIZATION WITH Beatrix Fetters;  Surgeon: Josue Hector, MD;  Location: [facility] C[facility] LAB;  Service: Cardiovascular;;  . LEFT HEART CATHETERIZATION WITH CORONARY/GRAFT ANGIOGRAM N/A 11/15/2013   Procedure: LEFT  HEART CATHETERIZATION WITH Beatrix Fetters;  Surgeon: Burnell Blanks, MD;  Location: [facility] CATH LAB;  Service: Cardiovascular;  Laterality: N/A;  . LUMBAR FUSION  06/08/2013   [place]  . LUMBAR LAMINECTOMY     x 4  . 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 TONSILLECTOMY         Home Medications    Prior to Admission medications   Medication Sig Start Date End Date Taking? Authorizing Provider  aspirin 325 MG tablet Take 325 mg by mouth daily.     Historical Provider, MD  atorvastatin (LIPITOR) 80 MG tablet Take 1 tablet (80 mg total) by mouth at bedtime. 10/18/16   Josue Hector, MD  gabapentin (NEURONTIN) 100 MG capsule Take 100 mg by mouth 3 (three) times daily. 11/20/16   Historical Provider, MD  HYDROcodone-acetaminophen (NORCO/VICODIN) 5-325 MG tablet Take 1 tablet by mouth every 8 (eight) hours as needed. 12/02/16   Binnie Rail, MD  ipratropium-albuterol (DUONEB) 0.5-2.5 (3) MG/3ML SOLN Take 3 mLs by nebulization every 6 (six) hours as needed. 11/25/16   Henreitta Leber, MD  metoprolol succinate (TOPROL XL) 25 MG 24 hr tablet Take 1 tablet (25 mg total) by mouth daily. Take with metoprolol 50 mg 10/18/16   Josue Hector, MD  metoprolol succinate (TOPROL-XL) 50 MG 24 hr tablet Take 1 tablet (50 mg total) by mouth daily. Take with Metoprolol 25 mg tablet 10/18/16   Josue Hector, MD  Multiple Vitamin (MULTIVITAMIN) tablet Take 1 tablet by mouth daily.      Historical Provider, MD  nitroGLYCERIN (NITROSTAT) 0.4 MG SL tablet Place 1 tablet (0.4 mg total) under the tongue every 5 (five) minutes as needed for chest pain. 03/15/15   Josue Hector, MD  Omega-3 Fatty Acids (FISH OIL PO) Take 1 capsule by mouth daily. Mega Red brand    Historical Provider, MD    Family History Family History  Problem Relation Age of Onset  . Heart attack Father 69  . Diabetes Mother   . Coronary artery disease Mother   . Heart attack Brother 26  . Heart attack Brother     in his 69s  . Cancer Maternal Grandmother     uterine cancer  . Coronary artery disease Other     M & P aunts/uncles  . Stroke Neg Hx     Social History Social History  Substance Use Topics  . Smoking status: Former Smoker    Packs/day: 1.00    Years: 38.00    Types: Cigarettes    Quit date: 10/01/1996  . Smokeless tobacco: Never Used     Comment: smoked 1960-1998, up 1 ppd  . Alcohol use No     Allergies   Codeine and Tramadol   Review of Systems Review of Systems    All other systems reviewed and are negative.    Physical Exam Updated Vital Signs BP 169/87   Pulse 105   Temp 97.1 F (36.2 C) (Rectal)   Resp 15   SpO2 98%   Physical Exam  Constitutional: He is oriented to person, place, and time. He appears well-developed and well-nourished.  HENT:  Head: Normocephalic and atraumatic.  Eyes: Conjunctivae are normal. Pupils are equal, round, and reactive to light. Right eye exhibits no discharge. Left eye exhibits no discharge. No scleral icterus.  Neck: Normal range of motion. No JVD present. No tracheal deviation present.  Cardiovascular:  Tachycardia  Pulmonary/Chest: Effort normal. No stridor.  Bilateral coarse breath sounds with minor expiratory wheeze  Musculoskeletal:  minor LEE  Neurological: He is alert and oriented to person, place, and time. Coordination normal.  Psychiatric: He has a normal mood and affect. His behavior is normal. Judgment and thought content normal.  Nursing note and vitals reviewed.    ED Treatments / Results  Labs (all labs ordered are listed, but only abnormal results are displayed) Labs Reviewed  CBC WITH DIFFERENTIAL/PLATELET - Abnormal; Notable for the following:       Result Value   WBC 20.3 (*)    RBC 4.20 (*)    Hemoglobin 12.8 (*)    Neutro Abs 16.7 (*)    Monocytes Absolute 1.8 (*)    Basophils Absolute 0.2 (*)    All other components within normal limits  COMPREHENSIVE METABOLIC PANEL - Abnormal; Notable for the following:    Chloride 100 (*)    Glucose, Bld 102 (*)    BUN 28 (*)    Creatinine, Ser 1.48 (*)    Calcium 13.4 (*)    Total Protein 6.1 (*)    Albumin 2.3 (*)    GFR calc non Af Amer 45 (*)    GFR calc Af Amer 52 (*)    All other components within normal limits  I-STAT CG4 LACTIC ACID, ED - Abnormal; Notable for the following:    Lactic Acid, Venous 3.21 (*)    All other components within normal limits  I-STAT CG4 LACTIC ACID, ED - Abnormal; Notable for the following:     Lactic Acid, Venous 2.93 (*)    All other components within normal limits  CULTURE, BLOOD (ROUTINE X 2)  CULTURE, BLOOD (ROUTINE X 2)  CBC WITH DIFFERENTIAL/PLATELET  URINALYSIS, ROUTINE W REFLEX MICROSCOPIC  INFLUENZA PANEL BY PCR (TYPE A & B)  I-STAT CG4 LACTIC ACID, ED    EKG  EKG Interpretation None       Radiology Dg Chest 2 View  Result Date: 11/29/2016 CLINICAL DATA:  Congestion, cough, history of recent pneumonia, former smoking history EXAM: CHEST  2 VIEW COMPARISON:  Chest x-ray of 11/22/2016 FINDINGS: Prominent coarse interstitial markings again are noted bilaterally left much greater than right with bilateral pleural effusions. There is now and opacity within the left lung apex which has developed since the chest x-ray 11/22/2016, therefore most consistent with loculated effusion. CT the chest is recommended to evaluate in more detail with IV contrast media. Mediastinal and hilar contours are unchanged and the heart is within normal limits in size. Median sternotomy sutures are noted from prior CABG. 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 IMPRESSION: 1. Worsening of coarse interstitial lung markings left-greater-than-right. Possible pneumonia as viral pneumonia versus interstitial edema with effusions. 2. New opacity in the left lung apex most consistent with loculated effusion in view of the rapid development. Recommend CT of the chest with IV contrast media. 3. Changes of asbestos related disease. Electronically Signed   By: Ivar Drape M.D.   On: 12/04/2016 08:24    Procedures Procedures (including critical care time)  CRITICAL CARE Performed by: Elmer Ramp   Total critical care time: 35 minutes  Critical care time was exclusive of separately billable procedures and treating other patients.  Critical care was necessary to treat or prevent imminent or life-threatening deterioration.  Critical care was time spent personally by me on the  following activities: development of treatment plan with patient and/or surrogate as well as nursing, discussions with consultants, evaluation of patient's  response to treatment, examination of patient, obtaining history from patient or surrogate, ordering and performing treatments and interventions, ordering and review of laboratory studies, ordering and review of radiographic studies, pulse oximetry and re-evaluation of patient's condition.  Medications Ordered in ED Medications  vancomycin (VANCOCIN) IVPB 750 mg/150 ml premix (not administered)  ceFEPIme (MAXIPIME) 2 g in dextrose 5 % 50 mL IVPB (not administered)  LORazepam (ATIVAN) 2 MG/ML injection (not administered)  sodium chloride 0.9 % bolus 1,000 mL (0 mLs Intravenous Stopped 12/26/2016 1325)    And  sodium chloride 0.9 % bolus 1,000 mL (0 mLs Intravenous Stopped 12/20/2016 1209)    And  sodium chloride 0.9 % bolus 500 mL (0 mLs Intravenous Stopped 12/24/2016 1342)  ceFEPIme (MAXIPIME) 2 g in dextrose 5 % 50 mL IVPB (0 g Intravenous Stopped 12/14/2016 1222)  ipratropium-albuterol (DUONEB) 0.5-2.5 (3) MG/3ML nebulizer solution 3 mL (3 mLs Nebulization Given 12/05/2016 1145)  vancomycin (VANCOCIN) 1,500 mg in sodium chloride 0.9 % 500 mL IVPB (0 mg Intravenous Stopped 12/16/2016 1339)  HYDROcodone-acetaminophen (NORCO/VICODIN) 5-325 MG per tablet 1 tablet (1 tablet Oral Given 12/14/2016 1236)  methylPREDNISolone sodium succinate (SOLU-MEDROL) 125 mg/2 mL injection 80 mg (80 mg Intravenous Given 12/16/2016 1324)  LORazepam (ATIVAN) injection 0.5 mg (0.5 mg Intravenous Given 12/06/2016 1326)  iopamidol (ISOVUE-300) 61 % injection (60 mLs  Contrast Given 12/25/2016 1419)     Initial Impression / Assessment and Plan / ED Course  I have reviewed the triage vital signs and the nursing notes.  Pertinent labs & imaging results that were available during my care of the patient were reviewed by me and considered in my medical decision making (see chart for details).     Final  Clinical Impressions(s) / ED Diagnoses   Final diagnoses:  Sepsis, due to unspecified organism ([place])  Healthcare-associated pneumonia    Labs: I-STAT lactic acid, blood culture, CBC and CMP  Imaging: DG Chest 2 View (yesterday) CT chest with contrast  Consults: [facility]  [employer]: Cefepime bank, Solu-Medrol, hydrocodone, Ativan  Discharge Meds:   Assessment/Plan:   76 year old male presents today with respiratory failure, sepsis likely secondary pneumonia. Pt also with elevated BNP from yesterday question small amount of heart failure.  Patient recently discharged on the 26th with influenza.  Patient had chest x-ray yesterday results called in today. Pt started on sepsis protocol.  Weightbase fluids given, pain medication given for neck pain.  Patient slightly agitated markedly improved with Ativan.  CT chest ordered for further clarification of x-ray findings.  Hospitalist consulted for ongoing management.   New Prescriptions New Prescriptions   No medications on file     Okey Regal, PA-C 12/02/2016 Elvaston Liu, MD 12/02/2016 [phone]

## 2016-12-03 NOTE — ED Notes (Signed)
Respiratory at bedside with PA and RN; patient is currently asleep and looks comfortable. Respiratory rate 14. RN to continue to monitor. No BIPAP initiated at this time.

## 2016-12-03 NOTE — ED Notes (Addendum)
EDP at bedside to reassess; patient is very anxious and cannot seem to get comfortable. Seems to be tiring.

## 2016-12-03 NOTE — ED Notes (Signed)
Pt taken off bipap to remove dentures and was placed back on the bipap. NAD at this time. Pt was able to remove dentures himself.

## 2016-12-03 NOTE — Progress Notes (Addendum)
Pharmacy Antibiotic Note  Curtis Galloway is a 76 y.o. male admitted on 12/23/2016 with pneumonia.  Pharmacy has been consulted for cefepime/vancomycin dosing. Afebrile, WBC 20.3, LA 3.21. SCr 1.48 on admit, CrCl~44.  Plan: Vancomycin '1500mg'$  IV x1; then '750mg'$  IV q12h Cefepime 2g IV x1; then 2g IV q24h Monitor clinical progress, c/s, renal function, abx plan/LOT Vancomycin trough as indicated      Temp (24hrs), Avg:97.5 F (36.4 C), Min:97.3 F (36.3 C), Max:97.6 F (36.4 C)   Recent Labs Lab 12/02/16 1710  WBC 22.2 cH*  CREATININE 1.36    Estimated Creatinine Clearance: 47.7 mL/min (by C-G formula based on SCr of 1.36 mg/dL).    Allergies  Allergen Reactions  . Codeine Nausea Only    severe  . Tramadol Nausea Only    severe    Elicia Lamp, PharmD, BCPS Clinical Pharmacist 12/09/2016 10:53 AM

## 2016-12-03 NOTE — ED Notes (Signed)
ED Provider at bedside. 

## 2016-12-03 NOTE — Telephone Encounter (Signed)
Pts wife called to check on the results from the chest x-ray. She said that he is having a hard time breathing today. Please call pts wife with results as soon as possible. Thanks [employer]

## 2016-12-03 NOTE — ED Notes (Signed)
Pt returned from CT; remains on bipap; seems less agitated. Family at bedside

## 2016-12-04 ENCOUNTER — Ambulatory Visit: Payer: Self-pay

## 2016-12-04 ENCOUNTER — Inpatient Hospital Stay (HOSPITAL_COMMUNITY): Payer: PPO

## 2016-12-04 LAB — RESPIRATORY PANEL BY PCR
ADENOVIRUS-RVPPCR: NOT DETECTED
Bordetella pertussis: NOT DETECTED
CHLAMYDOPHILA PNEUMONIAE-RVPPCR: NOT DETECTED
CORONAVIRUS HKU1-RVPPCR: NOT DETECTED
CORONAVIRUS NL63-RVPPCR: NOT DETECTED
CORONAVIRUS OC43-RVPPCR: NOT DETECTED
Coronavirus 229E: NOT DETECTED
Influenza A: NOT DETECTED
Influenza B: NOT DETECTED
MYCOPLASMA PNEUMONIAE-RVPPCR: NOT DETECTED
Metapneumovirus: NOT DETECTED
PARAINFLUENZA VIRUS 1-RVPPCR: NOT DETECTED
Parainfluenza Virus 2: NOT DETECTED
Parainfluenza Virus 3: NOT DETECTED
Parainfluenza Virus 4: NOT DETECTED
RHINOVIRUS / ENTEROVIRUS - RVPPCR: NOT DETECTED
Respiratory Syncytial Virus: NOT DETECTED

## 2016-12-04 LAB — CBC WITH DIFFERENTIAL/PLATELET
BASOS PCT: 0 %
Basophils Absolute: 0 10*3/uL (ref 0.0–0.1)
EOS PCT: 0 %
Eosinophils Absolute: 0 10*3/uL (ref 0.0–0.7)
HEMATOCRIT: 32.6 % — AB (ref 39.0–52.0)
HEMOGLOBIN: 10.3 g/dL — AB (ref 13.0–17.0)
LYMPHS PCT: 4 %
Lymphs Abs: 0.9 10*3/uL (ref 0.7–4.0)
MCH: 30 pg (ref 26.0–34.0)
MCHC: 31.6 g/dL (ref 30.0–36.0)
MCV: 95 fL (ref 78.0–100.0)
Monocytes Absolute: 1.4 10*3/uL — ABNORMAL HIGH (ref 0.1–1.0)
Monocytes Relative: 6 %
NEUTROS ABS: 20.3 10*3/uL — AB (ref 1.7–7.7)
NEUTROS PCT: 90 %
Platelets: 319 10*3/uL (ref 150–400)
RBC: 3.43 MIL/uL — ABNORMAL LOW (ref 4.22–5.81)
RDW: 13.6 % (ref 11.5–15.5)
WBC: 22.6 10*3/uL — ABNORMAL HIGH (ref 4.0–10.5)

## 2016-12-04 LAB — BASIC METABOLIC PANEL
ANION GAP: 8 (ref 5–15)
BUN: 35 mg/dL — ABNORMAL HIGH (ref 6–20)
CHLORIDE: 103 mmol/L (ref 101–111)
CO2: 30 mmol/L (ref 22–32)
Calcium: 12.3 mg/dL — ABNORMAL HIGH (ref 8.9–10.3)
Creatinine, Ser: 1.58 mg/dL — ABNORMAL HIGH (ref 0.61–1.24)
GFR calc Af Amer: 48 mL/min — ABNORMAL LOW (ref >60)
GFR calc non Af Amer: 41 mL/min — ABNORMAL LOW (ref >60)
Glucose, Bld: 115 mg/dL — ABNORMAL HIGH (ref 65–99)
POTASSIUM: 4.3 mmol/L (ref 3.5–5.1)
Sodium: 141 mmol/L (ref 135–145)

## 2016-12-04 LAB — GLUCOSE, CAPILLARY
GLUCOSE-CAPILLARY: 105 mg/dL — AB (ref 65–99)
Glucose-Capillary: 107 mg/dL — ABNORMAL HIGH (ref 65–99)
Glucose-Capillary: 102 mg/dL — ABNORMAL HIGH (ref 65–99)
Glucose-Capillary: 103 mg/dL — ABNORMAL HIGH (ref 65–99)

## 2016-12-04 LAB — PROTIME-INR
INR: 1.29
Prothrombin Time: 16.2 seconds — ABNORMAL HIGH (ref 11.4–15.2)

## 2016-12-04 LAB — APTT: aPTT: 44 seconds — ABNORMAL HIGH (ref 24–36)

## 2016-12-04 MED ORDER — LORAZEPAM 2 MG/ML IJ SOLN
0.5000 mg | Freq: Once | INTRAMUSCULAR | Status: AC
  Administered 2016-12-04: 0.5 mg via INTRAVENOUS
  Filled 2016-12-04: qty 1

## 2016-12-04 MED ORDER — ENSURE ENLIVE PO LIQD
237.0000 mL | Freq: Two times a day (BID) | ORAL | Status: DC
  Administered 2016-12-05 (×2): 237 mL via ORAL

## 2016-12-04 MED ORDER — SODIUM CHLORIDE 0.9 % IV SOLN
INTRAVENOUS | Status: DC
  Administered 2016-12-04 – 2016-12-05 (×3): via INTRAVENOUS

## 2016-12-04 MED ORDER — LEVALBUTEROL HCL 0.63 MG/3ML IN NEBU: 0.6300 mg | INHALATION_SOLUTION | RESPIRATORY_TRACT | Status: DC | PRN

## 2016-12-04 MED ORDER — LEVALBUTEROL HCL 0.63 MG/3ML IN NEBU
0.6300 mg | INHALATION_SOLUTION | Freq: Three times a day (TID) | RESPIRATORY_TRACT | Status: DC
  Administered 2016-12-04 – 2016-12-07 (×10): 0.63 mg via RESPIRATORY_TRACT
  Filled 2016-12-04 (×10): qty 3

## 2016-12-04 MED ORDER — IOPAMIDOL (ISOVUE-300) INJECTION 61%
INTRAVENOUS | Status: AC
  Administered 2016-12-04: 30 mL
  Filled 2016-12-04: qty 30

## 2016-12-04 MED ORDER — OXYCODONE HCL 5 MG PO TABS
5.0000 mg | ORAL_TABLET | ORAL | Status: DC | PRN
  Administered 2016-12-04: 10 mg via ORAL
  Administered 2016-12-05: 5 mg via ORAL
  Administered 2016-12-05: 10 mg via ORAL
  Filled 2016-12-04: qty 2
  Filled 2016-12-04: qty 1
  Filled 2016-12-04: qty 2
  Filled 2016-12-04: qty 1

## 2016-12-04 MED ORDER — POLYETHYLENE GLYCOL 3350 17 G PO PACK
17.0000 g | PACK | Freq: Every day | ORAL | Status: DC
  Administered 2016-12-05: 17 g via ORAL
  Filled 2016-12-04: qty 1

## 2016-12-04 MED ORDER — CHLORHEXIDINE GLUCONATE 0.12 % MT SOLN
15.0000 mL | Freq: Two times a day (BID) | OROMUCOSAL | Status: DC
  Administered 2016-12-04 – 2016-12-06 (×6): 15 mL via OROMUCOSAL
  Filled 2016-12-04 (×4): qty 15

## 2016-12-04 MED ORDER — IPRATROPIUM BROMIDE 0.02 % IN SOLN
0.5000 mg | Freq: Three times a day (TID) | RESPIRATORY_TRACT | Status: DC
  Administered 2016-12-04 – 2016-12-07 (×10): 0.5 mg via RESPIRATORY_TRACT
  Filled 2016-12-04 (×10): qty 2.5

## 2016-12-04 MED ORDER — MORPHINE SULFATE (PF) 2 MG/ML IV SOLN
1.0000 mg | INTRAVENOUS | Status: DC | PRN
  Administered 2016-12-04 – 2016-12-06 (×7): 2 mg via INTRAVENOUS
  Administered 2016-12-07: 1 mg via INTRAVENOUS
  Filled 2016-12-04 (×8): qty 1

## 2016-12-04 MED ORDER — ORAL CARE MOUTH RINSE
15.0000 mL | Freq: Two times a day (BID) | OROMUCOSAL | Status: DC
  Administered 2016-12-04 – 2016-12-05 (×3): 15 mL via OROMUCOSAL

## 2016-12-04 NOTE — Progress Notes (Signed)
[place] TEAM 1 - Stepdown/ICU TEAM  Curtis Galloway  RCV:[id number] DOB: 09-Nov-1940 DOA: 12/15/2016 PCP: Binnie Rail, MD    Brief Narrative:  76 y.o. male with history of CAD s/p CABG, HTN, and GERD who was recently admitted to the [facility] with Influenza A and completed a course of Tamiflu. He was discharged home on supplemental O2. Since his discharge home patient had been feeling weak and SOB.  Patient was seen by his PCP and a CXR noted pneumonia vs interstitial edema w/ a new opacity in the left upper lobe suspicious for a loculated effusion.  When his sx did not improve his family contacted EMS and he was brought to the ED.  In the ED his oxygen saturation was 89% on RA, but patient had increased work of breathing and was agitated with increasing shortness of breath.  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  Subjective: The patient is alert but agitated.  He does not appear to be in acute respiratory distress.  His saturations are 100%.  He is fidgeting about in bed and appears uncomfortable.  His main complaint per his wife has been severe neck pain of late.  He is moving all 4 extremities spontaneously.  He is not alert enough to provide a reliable history at this time.  Assessment & Plan:  Acute hypoxic respiratory failure Now off BIPAP w/ good sats - PNA v/s Lung CA v/s pneumonitis (recent Flu A) - cont empiric abx tx for now   Newly diagnosed lung process - CA likely  unclear metastatic vs primary - begin w/u w/ CT abdom/pelvix to look for possible primary or other metastatic disease - once this is done will need to consult IR to perform biopsy of abdnormal  tissue to provide a diagnosis   Acute encephalopathy May simply be toxic metabolic related to acute illness - worrisome for brain involvement in probable CA - would like MRI brain but clearly not stable for same presently - r/o other metabolic etiologies   Cervical spine pain likely related to CA involvement of spine - no focal neurologic deficits at present - will need cervical spine MRI when more stable and able to cooperate   Pulmonary embolism v/s Pulmonary thrombosis  Appears to be due to stenosis of pulmonary artery due to extrinsic compression for LAD - full dose lovenox - check for DVTs  Acute Kidney injury  crt 1.0 11/25/16  Recent Labs Lab 12/02/16 1710 12/06/2016 1123 12/04/16 0621  CREATININE 1.36 1.48* 1.58*    Hx Renal Cell CA s/p L nephrectomy 2004  DM type II A1C April 2017 6.4% - CBG currently well-controlled  CAD s/p CABG in 2015 No anginal complaints - reduced Aspirin to 81 mg given current Lovenox therapy for PE  Hyperlipidemia  Continue Lipiptor   DVT prophylaxis: full dose lovenox  Code Status: DO NOT INTUBATE  Family Communication: Spoke with wife, son, daughter, and extended family at bedside  Disposition Plan: SDU  Consultants:  None  Procedures: None  Antimicrobials:  Maxipime 3/6 > Vancomycin 3/6 >  Objective: Blood pressure 139/73, pulse (!) 101, temperature 98 F (36.7 C), temperature source Axillary, resp. rate 16, height '5\' 9"'$  (1.753 m), weight 77.1 kg (169 lb 15.6 oz),  SpO2 98 %.  Intake/Output Summary (Last 24 hours) at 12/04/16 0758 Last data filed at 12/04/16 0300  Gross per 24 hour  Intake             3200 ml  Output              975 ml  Net             2225 ml   Filed Weights   12/22/2016 1854 12/04/16 0350  Weight: 76.5 kg (168 lb 10.4 oz) 77.1 kg (169 lb 15.6 oz)    Examination: General: no acute respiratory distress - agitated and confused  Lungs: mild crackles th/o - no wheezing  Cardiovascular: tachycardic  but regular - no gallup or rub  Abdomen: Nontender, nondistended, soft, bowel sounds positive, no rebound, no ascites, no appreciable mass Extremities: No significant cyanosis, clubbing, or edema bilateral lower extremities  CBC:  Recent Labs Lab 12/02/16 1710 12/23/2016 1123 12/04/16 0621  WBC 22.2 cH* 20.3* 22.6*  NEUTROABS 18.6* 16.7* PENDING  HGB 13.0 12.8* 10.3*  HCT 39.8 39.9 32.6*  MCV 92.8 95.0 95.0  PLT 402.0* 330 643   Basic Metabolic Panel:  Recent Labs Lab 12/02/16 1710 12/12/2016 1123 12/04/16 0621  NA 143 143 141  K 4.2 4.3 4.3  CL 98 100* 103  CO2 37* 29 30  GLUCOSE 121* 102* 115*  BUN 25* 28* 35*  CREATININE 1.36 1.48* 1.58*  CALCIUM 12.6* 13.4* 12.3*   GFR: Estimated Creatinine Clearance: 41 mL/min (by C-G formula based on SCr of 1.58 mg/dL (H)).  Liver Function Tests:  Recent Labs Lab 12/02/16 1710 12/04/2016 1123  AST 18 32  ALT 22 24  ALKPHOS 91 87  BILITOT 0.5 0.6  PROT 5.9* 6.1*  ALBUMIN 2.8* 2.3*    HbA1C: Hgb A1c MFr Bld  Date/Time Value Ref Range Status  01/11/2016 09:48 AM 6.4 4.6 - 6.5 % Final    Comment:    Glycemic Control Guidelines for People with Diabetes:Non Diabetic:  <6%Goal of Therapy: <7%Additional Action Suggested:  >8%   06/30/2015 12:11 PM 6.2 4.6 - 6.5 % Final    Comment:    Glycemic Control Guidelines for People with Diabetes:Non Diabetic:  <6%Goal of Therapy: <7%Additional Action Suggested:  >8%     CBG:  Recent Labs Lab 12/27/2016 2055  GLUCAP 138*    Recent Results (from the past 240 hour(s))  MRSA PCR Screening     Status: None   Collection Time: 12/18/2016  7:17 PM  Result Value Ref Range Status   MRSA by PCR NEGATIVE NEGATIVE Final    Comment:        The GeneXpert MRSA Assay (FDA approved for NASAL specimens only), is one component of a comprehensive MRSA colonization surveillance program. It is not intended to diagnose MRSA infection nor to guide or monitor treatment for MRSA infections.       Scheduled Meds: . aspirin EC  81 mg Oral Daily  . atorvastatin  80 mg Oral QHS  . ceFEPime (MAXIPIME) IV  2 g Intravenous Q24H  . chlorhexidine  15 mL Mouth Rinse BID  . enoxaparin (LOVENOX) injection  1 mg/kg Subcutaneous Q12H  . insulin aspart  0-9 Units Subcutaneous TID WC  . mouth rinse  15 mL Mouth Rinse q12n4p  . metoprolol succinate  50 mg Oral Daily  . omega-3 acid ethyl esters  1 g Oral BID  . sodium chloride flush  3 mL Intravenous Q12H  . vancomycin  750  mg Intravenous Q12H     LOS: 1 day   Cherene Altes, MD [facility] Office  [phone] Pager - Text Page per Amion as per below:  On-Call/Text Page:      [email or web address]      password [email or web address]  If 7PM-7AM, please contact night-coverage [email or web address] Password [facility] 12/04/2016, 7:58 AM

## 2016-12-05 ENCOUNTER — Inpatient Hospital Stay (HOSPITAL_COMMUNITY): Payer: PPO

## 2016-12-05 ENCOUNTER — Other Ambulatory Visit: Payer: Self-pay

## 2016-12-05 DIAGNOSIS — J189 Pneumonia, unspecified organism: Secondary | ICD-10-CM

## 2016-12-05 DIAGNOSIS — I2699 Other pulmonary embolism without acute cor pulmonale: Secondary | ICD-10-CM

## 2016-12-05 DIAGNOSIS — N179 Acute kidney failure, unspecified: Secondary | ICD-10-CM

## 2016-12-05 DIAGNOSIS — I82403 Acute embolism and thrombosis of unspecified deep veins of lower extremity, bilateral: Secondary | ICD-10-CM

## 2016-12-05 DIAGNOSIS — R918 Other nonspecific abnormal finding of lung field: Secondary | ICD-10-CM

## 2016-12-05 DIAGNOSIS — R599 Enlarged lymph nodes, unspecified: Secondary | ICD-10-CM

## 2016-12-05 DIAGNOSIS — Z86711 Personal history of pulmonary embolism: Secondary | ICD-10-CM

## 2016-12-05 DIAGNOSIS — C799 Secondary malignant neoplasm of unspecified site: Secondary | ICD-10-CM

## 2016-12-05 DIAGNOSIS — I2510 Atherosclerotic heart disease of native coronary artery without angina pectoris: Secondary | ICD-10-CM

## 2016-12-05 DIAGNOSIS — Z85528 Personal history of other malignant neoplasm of kidney: Secondary | ICD-10-CM

## 2016-12-05 DIAGNOSIS — J910 Malignant pleural effusion: Secondary | ICD-10-CM

## 2016-12-05 DIAGNOSIS — I10 Essential (primary) hypertension: Secondary | ICD-10-CM

## 2016-12-05 DIAGNOSIS — R0902 Hypoxemia: Secondary | ICD-10-CM

## 2016-12-05 DIAGNOSIS — J9601 Acute respiratory failure with hypoxia: Secondary | ICD-10-CM

## 2016-12-05 DIAGNOSIS — M899 Disorder of bone, unspecified: Secondary | ICD-10-CM

## 2016-12-05 DIAGNOSIS — G9340 Encephalopathy, unspecified: Secondary | ICD-10-CM

## 2016-12-05 LAB — COMPREHENSIVE METABOLIC PANEL
ALBUMIN: 2 g/dL — AB (ref 3.5–5.0)
ALK PHOS: 60 U/L (ref 38–126)
ALT: 29 U/L (ref 17–63)
AST: 29 U/L (ref 15–41)
Anion gap: 10 (ref 5–15)
BILIRUBIN TOTAL: 0.7 mg/dL (ref 0.3–1.2)
BUN: 47 mg/dL — AB (ref 6–20)
CO2: 28 mmol/L (ref 22–32)
Calcium: 11.6 mg/dL — ABNORMAL HIGH (ref 8.9–10.3)
Chloride: 103 mmol/L (ref 101–111)
Creatinine, Ser: 1.57 mg/dL — ABNORMAL HIGH (ref 0.61–1.24)
GFR calc Af Amer: 48 mL/min — ABNORMAL LOW (ref >60)
GFR calc non Af Amer: 42 mL/min — ABNORMAL LOW (ref >60)
GLUCOSE: 76 mg/dL (ref 65–99)
POTASSIUM: 4.2 mmol/L (ref 3.5–5.1)
Sodium: 141 mmol/L (ref 135–145)
TOTAL PROTEIN: 4.7 g/dL — AB (ref 6.5–8.1)

## 2016-12-05 LAB — GLUCOSE, CAPILLARY
GLUCOSE-CAPILLARY: 80 mg/dL (ref 65–99)
GLUCOSE-CAPILLARY: 87 mg/dL (ref 65–99)
GLUCOSE-CAPILLARY: 93 mg/dL (ref 65–99)
Glucose-Capillary: 75 mg/dL (ref 65–99)
Glucose-Capillary: 71 mg/dL (ref 65–99)
Glucose-Capillary: 82 mg/dL (ref 65–99)

## 2016-12-05 LAB — CBC
HEMATOCRIT: 27.2 % — AB (ref 39.0–52.0)
Hemoglobin: 8.8 g/dL — ABNORMAL LOW (ref 13.0–17.0)
MCH: 31.1 pg (ref 26.0–34.0)
MCHC: 32.4 g/dL (ref 30.0–36.0)
MCV: 96.1 fL (ref 78.0–100.0)
Platelets: 286 10*3/uL (ref 150–400)
RBC: 2.83 MIL/uL — ABNORMAL LOW (ref 4.22–5.81)
RDW: 13.8 % (ref 11.5–15.5)
WBC: 18.6 10*3/uL — ABNORMAL HIGH (ref 4.0–10.5)

## 2016-12-05 LAB — PROCALCITONIN: Procalcitonin: 0.15 ng/mL

## 2016-12-05 MED ORDER — HEPARIN (PORCINE) IN NACL 100-0.45 UNIT/ML-% IJ SOLN
1250.0000 [IU]/h | INTRAMUSCULAR | Status: DC
  Administered 2016-12-05: 1250 [IU]/h via INTRAVENOUS
  Filled 2016-12-05: qty 250

## 2016-12-05 MED ORDER — HALOPERIDOL LACTATE 5 MG/ML IJ SOLN
1.0000 mg | Freq: Once | INTRAMUSCULAR | Status: AC
  Administered 2016-12-05: 0.2 mg via INTRAMUSCULAR
  Filled 2016-12-05: qty 1

## 2016-12-05 NOTE — Consult Note (Addendum)
[place]  Telephone:([phone]) [place]   CHILD CAMPOY  DOB: 1941-02-02  MR#: [id number]  CSN#: [id number]    Requesting Physician: [facility]  Patient Care Team: Binnie Rail, MD as PCP - General (Internal Medicine) Dannielle Karvonen, RN as [place] Management  Reason for consult: possible metastatic cancer to bone and lungs   History of present illness:   76 y.o.malewith history of CAD s/p CABG, HTN, and GERD and remote history of left RCC, s/p left nephrectomy, who was recently admitted to the [facility] on 11/22/2016 for dyspnea and weakness. He was treated forInfluenza A and completed a course of Tamiflu. He was discharged home on supplemental O2. Due to his worsening dyspnea, he was brought to [facility] ED on 12/13/2016. In the ED his oxygen saturation was 89% on RA, but patient had increased work of breathing and was agitated with increasing shortness of breath.  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  I saw him tonight when he came back from MRI, he had increased work of breathing and could not do MRI. He is lethargic, did not answer questions. Most history from family. He received Haldol before he went down for MRI.  MEDICAL HISTORY:  Past Medical History:  Diagnosis Date  . Abnormal nuclear stress test 11/16/2013  . Coronary artery disease    Dr. Johnsie Cancel,  stent to LAD  . GERD (gastroesophageal reflux disease)   . Hyperlipidemia   . 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 Hypertension     SURGICAL HISTORY: Past Surgical History:  Procedure Laterality Date  . APPENDECTOMY    .  CARDIAC CATHETERIZATION  11/01/2011   Dr Johnsie Cancel  . CARDIAC CATHETERIZATION N/A 03/16/2015   Procedure: Left Heart Cath and Cors/Grafts Angiography;  Surgeon: Josue Hector, MD;  Location: [place] CV LAB;  Service: Cardiovascular;  Laterality: N/A;  . CERVICAL LAMINECTOMY     x 1  . colonoscopy with polypectomy  2014   Dr Paulita Fujita  . colonscopy  2006   Dr. Delfin Edis; negative  . CORONARY ANGIOPLASTY WITH STENT PLACEMENT  1990's; 11/15/2013   "today's make a total of 3" (11/15/2013)  . CORONARY ANGIOPLASTY WITH STENT PLACEMENT  11/15/13   Promus to prox. LAD  . CORONARY ARTERY BYPASS GRAFT  2000   Dr. Roxan Hockey 2001  . KNEE SURGERY  3-4 surgeries   bone moved from hip to under knee cap  . LEFT HEART CATHETERIZATION WITH CORONARY/GRAFT ANGIOGRAM  11/01/2011   Procedure: LEFT HEART CATHETERIZATION WITH Beatrix Fetters;  Surgeon: Josue Hector, MD;  Location: [facility] CATH LAB;  Service: Cardiovascular;;  . LEFT HEART CATHETERIZATION WITH CORONARY/GRAFT ANGIOGRAM N/A 11/15/2013   Procedure: LEFT HEART CATHETERIZATION WITH Beatrix Fetters;  Surgeon: Burnell Blanks, MD;  Location: [facility] CATH LAB;  Service: Cardiovascular;  Laterality: N/A;  . LUMBAR FUSION  06/08/2013   [place]  . LUMBAR LAMINECTOMY     x 4  . 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 TONSILLECTOMY      SOCIAL HISTORY: Social History   Social History  . Marital status: Married    Spouse name: N/A  . Number of children:  N/A  . Years of education: N/A   Occupational History  . Not on file.   Social History Main Topics  . Smoking status: Former Smoker    Packs/day: 1.00    Years: 38.00    Types: Cigarettes    Quit date: 10/01/1996  . Smokeless tobacco: Never Used     Comment: smoked 1960-1998, up 1 ppd  . Alcohol use No  . Drug use: No  . Sexual activity: Yes   Other Topics Concern  . Not on file   Social History Narrative   Retired   Married   Former smoker   Alcohol use-no   Regular  exercise-no    FAMILY HISTORY: Family History  Problem Relation Age of Onset  . Heart attack Father 19  . Diabetes Mother   . Coronary artery disease Mother   . Heart attack Brother 39  . Heart attack Brother     in his 72s  . Cancer Maternal Grandmother     uterine cancer  . Coronary artery disease Other     M & P aunts/uncles  . Stroke Neg Hx     ALLERGIES:  is allergic to codeine and tramadol.  MEDICATIONS:  Current Facility-Administered Medications  Medication Dose Route Frequency Provider Last Rate Last Dose  . 0.9 %  sodium chloride infusion   Intravenous Continuous Cherene Altes, MD 75 mL/hr at 12/05/16 0631    . acetaminophen (TYLENOL) tablet 650 mg  650 mg Oral Q6H PRN Brenton Grills, PA-C   650 mg at 12/05/16 0211   Or  . acetaminophen (TYLENOL) suppository 650 mg  650 mg Rectal Q6H PRN Brenton Grills, PA-C      . aspirin EC tablet 81 mg  81 mg Oral Daily Brenton Grills, PA-C   81 mg at 12/05/16 1027  . atorvastatin (LIPITOR) tablet 80 mg  80 mg Oral QHS Brenton Grills, PA-C   80 mg at 12/04/16 2024  . ceFEPIme (MAXIPIME) 2 g in dextrose 5 % 50 mL IVPB  2 g Intravenous Q24H Romona Curls, RPH   2 g at 12/05/16 1031  . chlorhexidine (PERIDEX) 0.12 % solution 15 mL  15 mL Mouth Rinse BID Waldemar Dickens, MD   15 mL at 12/05/16 1027  . feeding supplement (ENSURE ENLIVE) (ENSURE ENLIVE) liquid 237 mL  237 mL Oral BID BM Cherene Altes, MD   237 mL at 12/05/16 1841  . heparin ADULT infusion 100 units/mL (25000 units/267m sodium chloride 0.45%)  1,250 Units/hr Intravenous Continuous TSkeet Simmer RPH 12.5 mL/hr at 12/05/16 2020 1,250 Units/hr at 12/05/16 2020  . insulin aspart (novoLOG) injection 0-9 Units  0-9 Units Subcutaneous TID WC MBrenton Grills PA-C   1 Units at 11/28/2016 2104  . ipratropium (ATROVENT) nebulizer solution 0.5 mg  0.5 mg Nebulization Q8H JCherene Altes MD   0.5 mg at 12/05/16 2010  . levalbuterol (XOPENEX) nebulizer  solution 0.63 mg  0.63 mg Nebulization Q3H PRN JCherene Altes MD      . levalbuterol ([facility] nebulizer solution 0.63 mg  0.63 mg Nebulization Q8H JCherene Altes MD   0.63 mg at 12/05/16 2011  . MEDLINE mouth rinse  15 mL Mouth Rinse q12n4p DWaldemar Dickens MD   15 mL at 12/05/16 1841  . metoprolol succinate (TOPROL-XL) 24 hr tablet 50 mg  50 mg Oral Daily MBrenton Grills PA-C   50 mg at 12/05/16 1027  . morphine 2  MG/ML injection 1-2 mg  1-2 mg Intravenous Q3H PRN Cherene Altes, MD   2 mg at 12/05/16 1428  . nitroGLYCERIN (NITROSTAT) SL tablet 0.4 mg  0.4 mg Sublingual Q5 min PRN Brenton Grills, PA-C      . omega-3 acid ethyl esters (LOVAZA) capsule 1 g  1 g Oral BID Brenton Grills, PA-C   1 g at 12/05/16 1043  . ondansetron (ZOFRAN) tablet 4 mg  4 mg Oral Q6H PRN Brenton Grills, PA-C       Or  . ondansetron [facility]) injection 4 mg  4 mg Intravenous Q6H PRN Brenton Grills, PA-C   4 mg at 12/05/16 2029  . oxyCODONE (Oxy IR/ROXICODONE) immediate release tablet 5-10 mg  5-10 mg Oral Q4H PRN Cherene Altes, MD   10 mg at 12/05/16 1158  . polyethylene glycol (MIRALAX / GLYCOLAX) packet 17 g  17 g Oral Daily Cherene Altes, MD   17 g at 12/05/16 1028  . sodium chloride flush (NS) 0.9 % injection 3 mL  3 mL Intravenous Q12H Brenton Grills, PA-C   3 mL at 12/05/16 1028  . traZODone (DESYREL) tablet 25 mg  25 mg Oral QHS PRN Brenton Grills, PA-C        REVIEW OF SYSTEMS:   Constitutional: Denies fevers, chills or abnormal night sweats, (+) fatigue  Respiratory: (+) cough and dyspnea, no wheezes Cardiovascular: Denies palpitation, chest discomfort or lower extremity swelling Gastrointestinal:  Denies nausea, heartburn or change in bowel habits, (+) No appetite  Skin: Denies abnormal skin rashes Lymphatics: Denies new lymphadenopathy or easy bruising Neurological:Denies numbness, tingling or new weaknesses Behavioral/Psych: Mood is stable, no new changes    All other systems were reviewed with the patient and are negative.  PHYSICAL EXAMINATION: ECOG PERFORMANCE STATUS: 4 - Bedbound  Vitals:   12/05/16 1444 12/05/16 1548  BP:    Pulse: (!) 102   Resp: 14   Temp:  (!) 96.6 F (35.9 C)   Filed Weights   12/21/2016 1854 12/04/16 0350 12/05/16 0500  Weight: 168 lb 10.4 oz (76.5 kg) 169 lb 15.6 oz (77.1 kg) 171 lb 11.8 oz (77.9 kg)    GENERAL: Lethargic, does not smoke questions, follow simple commands. SKIN: skin color, texture, turgor are normal, no rashes or significant lesions EYES: normal, conjunctiva are pink and non-injected, sclera clear NECK: supple, thyroid normal size, non-tender, without nodularity LYMPH:  no palpable lymphadenopathy in the cervical, axillary or inguinal LUNGS: (+) Bilateral crackles, no wheezes HEART: regular rate & rhythm and no murmurs and no lower extremity edema ABDOMEN:abdomen soft, non-tender and normal bowel sounds Musculoskeletal:no cyanosis of digits and no clubbing  PSYCH: alert & oriented x 3 with fluent speech NEURO: no focal motor/sensory deficits  LABORATORY DATA:  I have reviewed the data as listed Lab Results  Component Value Date   WBC 18.6 (H) 12/05/2016   HGB 8.8 (L) 12/05/2016   HCT 27.2 (L) 12/05/2016   MCV 96.1 12/05/2016   PLT 286 12/05/2016    Recent Labs  12/02/16 1710 12/27/2016 1123 12/04/16 0621 12/05/16 0851  NA 143 143 141 141  K 4.2 4.3 4.3 4.2  CL 98 100* 103 103  CO2 37* _0 GLUCOSE 121* 102* 115* 76  BUN 25* 28* 35* 47*  CREATININE 1.36 1.48* 1.58* 1.57*  CALCIUM 12.6* 13.4* 12.3* 11.6*  GFRNONAA  --  45* 41* 42*  GFRAA  --  52* 48* 48*  PROT 5.9* 6.1*  --  4.7*  ALBUMIN 2.8* 2.3*  --  2.0*  AST 18 32  --  29  ALT 22 24  --  29  ALKPHOS 91 87  --  60  BILITOT 0.5 0.6  --  0.7    RADIOGRAPHIC STUDIES: I have personally reviewed the radiological images as listed and agreed with the findings in the report. Dg Chest 2 View  Result Date:  12/18/2016 CLINICAL DATA:  Congestion, cough, history of recent pneumonia, former smoking history EXAM: CHEST  2 VIEW COMPARISON:  Chest x-ray of 11/22/2016 FINDINGS: Prominent coarse interstitial markings again are noted bilaterally left much greater than right with bilateral pleural effusions. There is now and opacity within the left lung apex which has developed since the chest x-ray 11/22/2016, therefore most consistent with loculated effusion. CT the chest is recommended to evaluate in more detail with IV contrast media. Mediastinal and hilar contours are unchanged and the heart is within normal limits in size. Median sternotomy sutures are noted from prior CABG. 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 IMPRESSION: 1. Worsening of coarse interstitial lung markings left-greater-than-right. Possible pneumonia as viral pneumonia versus interstitial edema with effusions. 2. New opacity in the left lung apex most consistent with loculated effusion in view of the rapid development. Recommend CT of the chest with IV contrast media. 3. Changes of asbestos related disease. Electronically Signed   By: Ivar Drape M.D.   On: 12/01/2016 08:24   Dg Chest 2 View  Result Date: 11/22/2016 CLINICAL DATA:  Shortness of breath.  Chest pain . EXAM: CHEST  2 VIEW COMPARISON:  08/28/2015 FINDINGS: Prior CABG. Heart size normal. No pulmonary venous congestion. Bilateral pulmonary interstitial prominence noted, particularly prominent on the left. Pneumonitis could present in this fashion. Interstitial edema could present in this fashion. 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 Similar findings noted on prior exam. No pneumothorax. No acute bony abnormality. IMPRESSION: 1. Bilateral pulmonary interstitial prominence, left side greater right. Bilateral pneumonitis and/or interstitial edema could present in this fashion. 2. Bilateral pleural thickening with calcified  pleural plaques again noted. 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 3. Prior CABG.  Heart size stable. Electronically Signed   By: Marcello Moores  Register   On: 11/22/2016 10:41   Dg Cervical Spine Complete  Result Date: 11/18/2016 CLINICAL DATA:  Ongoing neck pain for several weeks. No reported trauma. EXAM: CERVICAL SPINE - COMPLETE 4+ VIEW COMPARISON:  None. FINDINGS: No traumatic malalignment. There is straightening of normal lordosis. The pre odontoid space and prevertebral soft tissues are normal. Multilevel degenerative changes are seen with anterior osteophytes and loss of disc space particularly at C4-5 and C6-7. The neural foramina are patent. The lateral masses of C1 align with C2. The odontoid process is unremarkable. Chronic calcifications are seen in the neck. IMPRESSION: 1. Moderate degenerative changes in the cervical spine. 2. Chronic calcifications in the neck. 3. No other acute abnormalities. Electronically Signed   By: Dorise Bullion III M.D   On: 11/18/2016 16:12   Ct Chest W Contrast  Result Date: 12/01/2016 CLINICAL DATA:  Productive cough, worsening short of breath EXAM: CT CHEST WITH CONTRAST TECHNIQUE: Multidetector CT imaging of the chest was performed during intravenous contrast administration. CONTRAST:  62m ISOVUE-300 IOPAMIDOL (ISOVUE-300) INJECTION 61% COMPARISON:  CT 04/11/2016 FINDINGS: Cardiovascular: Post CABG.  No pericardial fluid. Within the RIGHT lower lobe pulmonary artery there is a thin tubular branching filling defect (image 74  through 79 series 201). There is extensive hilar adenopathy at this same level with external compression of the pulmonary artery (image 75, series 601) Mediastinum/Nodes: No axillary or supraclavicular adenopathy. Subcarinal lymph node measures 22 mm. RIGHT lower paratracheal node measures 12 mm. RIGHT hilar nodal complex measures 3.2 cm. RIGHT infrahilar nodal complex measures 2.7 cm (image 35 series 6). The mild perihilar thickening  on the LEFT Lungs/Pleura: There are bilateral small effusions. Effusions extend along the oblique fissures and partially loculated fluid at the LEFT lung apex. There is interstitial thickening extending from the hilum within the LEFT upper lobe and LEFT lower lobe. Within the RIGHT lung scattered interstitial thickening and peripheral nodularity. No discrete measurable nodule. Calcification over the RIGHT diaphragm. Upper Abdomen: Limited view of the liver, kidneys, pancreas are unremarkable. Normal adrenal glands. Musculoskeletal: Multiple rounded lucent lesions within the spine. For example multiple 5 to 7 mm lesions on image 132, series 601 in the lower thoracic spines. Similar lesions in the upper thoracic spine (image 30, series 201) measuring 9 mm. Lytic lesion in the LEFT aspect of the manubrium. IMPRESSION: 1. Diffuse interstitial thickening in the LEFT lung with loculated fluid at the LEFT lung apex. 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 2. RIGHT hilar adenopathy and infrahilar nodal thickening as well as a large mediastinal nodes is concerning for metastatic adenopathy. 3. Small filling defect within the RIGHT lower lobe pulmonary nodule consistent with PULMONARY EMBOLISM. This etiology of embolism may be local compression of the artery by the RIGHT infrahilar adenopathy. Cannot exclude peripheral deep venous thrombosis. 4. 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 5. Patient has a history of LEFT nephrectomy. Differential for malignancy would include bronchogenic carcinoma versus metastatic renal cell carcinoma. Findings conveyed toLiu, MDon 12/02/2016  at14:48. Electronically Signed   By: Genevive Bi M.D.   On: 12/04/2016 14:52   Ct Abdomen Pelvis W Contrast  Result Date: 12/04/2016 CLINICAL DATA:  Renal cell carcinoma status post nephrectomy and history of asbestos exposure. Concern for metastatic renal cancer. EXAM: CT ABDOMEN AND PELVIS WITH  CONTRAST TECHNIQUE: Multidetector CT imaging of the abdomen and pelvis was performed using the standard protocol following bolus administration of intravenous contrast. CONTRAST:  100 cc Isovue-300 COMPARISON:  12/26/2016 chest CT, CT abdomen and pelvis 04/11/2016 FINDINGS: Lower chest: Bilateral pleural effusions left greater than right as on recent chest CT with calcified and noncalcified pleural plaque in keeping with history of prior asbestos exposure most prominently involving the right diaphragmatic pleura Normal visualized cardiac chambers. No pericardial effusion. Status post median sternotomy. Coronary arteriosclerosis is identified. Changes of paraseptal emphysema. Bilateral lower lobe bronchiectasis and peribronchial thickening. Interstitial thickening is seen within the visualized lower lobes as before. Hepatobiliary: Cholelithiasis without complication. Homogeneous attenuation of liver. No biliary dilatation. Pancreas: Atrophic pancreas.  No ductal dilatation or mass. Spleen: Normal normal Adrenals/Urinary Tract: Compensatory hypertrophy of the right kidney without enhancing mass. Status post left nephrectomy. Renovascular calcifications are noted at the hilum. Nonobstructing calculi are also present in the interpolar aspect versus vascular calcifications. No enhancing mass of the right kidney. Chronic mild thickening of the left adrenal gland. Stomach/Bowel: Contrast distended stomach. Normal small bowel rotation without inflammation or obstruction. Moderate colonic stool burden within large bowel without obstruction or inflammation. Appendix is not visualized but no pericecal inflammation is noted. Vascular/Lymphatic: Aortoiliac atherosclerosis without aneurysm. Calcified mesenteric lymph node in left upper quadrant. Reproductive: Normal size prostate. Other: No abdominal wall hernia or abnormality. No abdominopelvic  ascites. Musculoskeletal: Posterior fusion hardware from L3 through L5 with spinal  decompression and interbody blocks noted. Osteolytic metastatic foci along the visualized lower thoracic and lumbar spine from T10 caudad to S1. Osteolytic lesions of the iliac bones bilaterally, left parasymphysis, right inferior pubic ramus, left ischium IMPRESSION: 1. 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 Pre-existing posterior lumbar decompression and fusion from L3 through L5. 2. Status post left nephrectomy. Compensatory hypertrophy of the right kidney without focal mass or obstruction. 3. Uncomplicated cholelithiasis. 4. Nonobstructing right-sided nephrolithiasis and renovascular calcifications. 5. Small left greater than right pleural effusions with changes secondary to asbestos exposure. Interstitial prominence at each base cannot exclude lymphangitic spread of malignancy. 6. Chronic thickened appearance of the left adrenal gland. Electronically Signed   By: Ashley Royalty M.D.   On: 12/04/2016 21:04    ASSESSMENT & PLAN: 76 y.o.malewith history of CAD s/p CABG, HTN, and GERD and remote history of left RCC, s/p left nephrectomy, who presents with worsening dyspnea, chest pain and hypoxic respiratory failure required BIPAP  1. Acute hypoxic respiratory failure 2. Left lung infiltrative change, thoracic adenopathy, and left pleural effusion, infection versus malignancy 3. Right PE and B/L DVT  4. Diffuse osteolytic lesions, concerning for bone metastasis vs multiple myeloma 5. Acute encephalopathy, secondary to hypoxia and hypercalcemia  6. Hypercalcemia 7. AKI  8. History of renal cell carcinoma status post left nephrectomy in 2004 9. CAD status post CABG in 2015  10 HTN   Recommendations: -I have reviewed his CT scans with radiologist, his lung changes and diffuse lytic bone lesions are concerning for malignancy, especially metastatic cancer.  -Giving his hypercalcemia, new onset anemia, and AKI, MM should be ruled out, I will order SPEP/IFE and  serum light chain levels -consult IR for left iliac lytic bone lesion biopsy and aspiration to rule out malignancy or MM -due to his PE and DVT, he needs A/C, I recommend switch lovenox to hepatin drip (also due to his AKI), which can be held before biopsy  -It would be also helpful if IR can also do diagnostic thoracentesis for cytology and cultures, to know if her lung changes are malignant vs infectious  -his corrected calcium is 13.2, he would benefit from calcitonin or pamidronate -I have discussed the above with Dr. Reesa Chew, and pt's family members  -I will follow up    All questions were answered. The patient knows to call the clinic with any problems, questions or concerns.      Truitt Merle, MD 12/05/2016 8:39 PM

## 2016-12-05 NOTE — Progress Notes (Signed)
*  PRELIMINARY RESULTS* Vascular Ultrasound Lower extremity venous duplex has been completed.  Preliminary findings: DVT involving bilateral soleal veins.   Gave results to [place], Therapist, sports.   Landry Mellow, RDMS, RVT  12/05/2016, 1:53 PM

## 2016-12-05 NOTE — Progress Notes (Signed)
ANTICOAGULATION CONSULT NOTE - Initial Consult  Pharmacy Consult for Heparin Indication: pulmonary embolus and DVT  Allergies  Allergen Reactions  . Codeine Nausea Only    severe  . Tramadol Nausea Only    severe    Patient Measurements: Height: '5\' 9"'$  (175.3 cm) Weight: 171 lb 11.8 oz (77.9 kg) IBW/kg (Calculated) : 70.7 Heparin Dosing Weight: 77.9 kg  Labs:  Recent Labs  12/09/2016 1123 12/04/16 0621 12/04/16 1513 12/05/16 0851  HGB 12.8* 10.3*  --  8.8*  HCT 39.9 32.6*  --  27.2*  PLT 330 319  --  286  APTT  --   --  44*  --   LABPROT  --   --  16.2*  --   INR  --   --  1.29  --   CREATININE 1.48* 1.58*  --  1.57*    Estimated Creatinine Clearance: 41.3 mL/min (by C-G formula based on SCr of 1.57 mg/dL (H)).  Assessment:   76 yr old male with PE and bilateral DVT. Has been on Lovenox 1 mg/kg sq q12hr, to change to IV heparin.  Last Lovenox dose of 75 mg sq given at 8:40 am, so about 12 hours ago.  Ok to begin IV heparin without bolus now.  Currently off the floor at MRI. Planning CT-guided biopsy of likely iliac lesion.  Goal of Therapy:  Heparin level 0.3-0.7 units/ml Monitor platelets by anticoagulation protocol: Yes   Plan:   Begin Heparin drip at 1250 units/hr (~16 units/kg/hr)  Heparin level ~6 hrs after drip begins.  Daily heparin level and CBC while on heparin.  Will follow up for timing of holding IV heparin pre- and post-procedure.  Arty Baumgartner, [place] Pager: [phone] 12/05/2016,7:57 PM

## 2016-12-05 NOTE — Patient Outreach (Signed)
[facility]) Care Management  12/05/2016  [place] [birth year] [id number]   Per chart review, patient has been admitted to [place] notified hospital liaisons of patients admission.   PLAN: Will follow up with hospital liaisons regarding patients hospital disposition  Quinn Plowman RN,BSN,CCM [facility] Telephonic  [phone]

## 2016-12-05 NOTE — Progress Notes (Addendum)
[place] TEAM 1 - Stepdown/ICU TEAM  KALUB MORILLO  NAT:[id number] DOB: January 12, 1941 DOA: 12/13/2016 PCP: Binnie Rail, MD    Brief Narrative:  76 y.o. male with history of CAD s/p CABG, HTN, and GERD who was recently admitted to the [facility] with Influenza A and completed a course of Tamiflu. He was discharged home on supplemental O2. Since his discharge home patient had been feeling weak and SOB.  Patient was seen by his PCP and a CXR noted pneumonia vs interstitial edema w/ a new opacity in the left upper lobe suspicious for a loculated effusion.  When his sx did not improve his family contacted EMS and he was brought to the ED.  In the ED his oxygen saturation was 89% on RA, but patient had increased work of breathing and was agitated with increasing shortness of breath.  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  Subjective: Patient appears slightly agitated which appears likely due to pain. He was just given pain medication. No other complaints  Family present at bedside and I discussed his plan of care at length with them and they're in agreement.   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 CT abdomen pelvis done which showed previous nephrectomy, nonobstructive renal stone, lumbar lytic lesion. I spoke with interventional radiology who will help with performing IR CT-guided biopsy of  likely iliac lesion. Hold morning dose of Lovenox prior to his procedure. Keep him nothing by mouth past midnight Spoke with Dr. Annamaria Boots from oncology who will follow along as a consult. Due to concerns of multiple myeloma in the setting of altered mental status, hypercalcemia, renal insufficiency and anemia Dr. Annamaria Boots will ordered multiple myeloma workup. PSA levels ordered MRI of the brain to look for any metastases. He can get one time 1 mg of IV/IM Haldol to help his agitation during the MRI .  Acute encephalopathy Unknown etiology at this time as it could be from underlying Pulm infection for which he is on broad-spectrum antibiotic or it could be from possible brain metastases. Further workup in place.  Cervical spine pain Pain control. Radiologic imaging-MRI if possible.   Pulmonary embolism v/s Pulmonary thrombosis  Currently on treatment dose of Lovenox 1 mg/kg. Lower extremity duplex is positive for DVT as well.   Addendum at 7pm: per Onc recs  Heparin drip instead of lovenox. Ordered placed.  Acute Kidney injury -stable crt 1.0 11/25/16  Recent Labs Lab 12/02/16 1710 12/10/2016 1123 12/04/16 0621 12/05/16 0851  CREATININE 1.36 1.48* 1.58* 1.57*    Hx Renal Cell CA s/p L nephrectomy 2004  DM type II A1C April 2017 6.4% - CBG currently well-controlled  CAD s/p CABG in 2015 No anginal complaints - reduced Aspirin to 81 mg given current Lovenox therapy for PE  Hyperlipidemia  Continue Lipitor   DVT prophylaxis: full dose lovenox  Code Status: DO NOT INTUBATE  Family Communication: Spoke with wife, son, daughter,  and extended family at bedside  Disposition Plan: SDU  Consultants:  Heme onc: Dr Burr Medico  IR  Procedures: None  Antimicrobials:  Maxipime 3/6 > Vancomycin 3/6 >  Objective: Blood pressure (!) 172/81, pulse (!) 102, temperature (!) 96.6 F (35.9 C), temperature source Axillary, resp. rate 14, height 5' 9" (1.753 m), weight 77.9 kg (171 lb 11.8 oz),  SpO2 96 %.  Intake/Output Summary (Last 24 hours) at 12/05/16 1735 Last data filed at 12/05/16 1433  Gross per 24 hour  Intake             1168 ml  Output             1700 ml  Net             -532 ml   Filed Weights   12/17/2016 1854 12/04/16 0350 12/05/16 0500  Weight: 76.5 kg (168 lb 10.4 oz) 77.1 kg (169 lb 15.6 oz) 77.9 kg (171 lb 11.8 oz)    Examination: General: no acute respiratory distress - agitated and confused  Lungs: mild crackles th/o - no wheezing  Cardiovascular:  regular - no gallup or rub  Abdomen: Nontender, nondistended, soft, bowel sounds positive, no rebound, no ascites, no appreciable mass Extremities: No significant cyanosis, clubbing, or edema bilateral lower extremities  CBC:  Recent Labs Lab 12/02/16 1710 12/02/2016 1123 12/04/16 0621 12/05/16 0851  WBC 22.2 cH* 20.3* 22.6* 18.6*  NEUTROABS 18.6* 16.7* 20.3*  --   HGB 13.0 12.8* 10.3* 8.8*  HCT 39.8 39.9 32.6* 27.2*  MCV 92.8 95.0 95.0 96.1  PLT 402.0* 330 319 161   Basic Metabolic Panel:  Recent Labs Lab 12/02/16 1710 12/11/2016 1123 12/04/16 0621 12/05/16 0851  NA 143 143 141 141  K 4.2 4.3 4.3 4.2  CL 98 100* 103 103  CO2 37* _0 GLUCOSE 121* 102* 115* 76  BUN 25* 28* 35* 47*  CREATININE 1.36 1.48* 1.58* 1.57*  CALCIUM 12.6* 13.4* 12.3* 11.6*   GFR: Estimated Creatinine Clearance: 41.3 mL/min (by C-G formula based on SCr of 1.57 mg/dL (H)).  Liver Function Tests:  Recent Labs Lab 12/02/16 1710 12/11/2016 1123 12/05/16 0851  AST 18 32 29  ALT _1 ALKPHOS 91 87 60  BILITOT 0.5 0.6 0.7  PROT 5.9* 6.1* 4.7*  ALBUMIN 2.8* 2.3* 2.0*    HbA1C: Hgb A1c MFr Bld  Date/Time Value Ref Range Status  01/11/2016 09:48 AM 6.4 4.6 - 6.5 % Final    Comment:    Glycemic Control Guidelines for People with Diabetes:Non Diabetic:  <6%Goal of Therapy: <7%Additional Action Suggested:  >8%   06/30/2015 12:11 PM 6.2 4.6 - 6.5 % Final    Comment:    Glycemic Control Guidelines for  People with Diabetes:Non Diabetic:  <6%Goal of Therapy: <7%Additional Action Suggested:  >8%     CBG:  Recent Labs Lab 12/05/16 0056 12/05/16 0451 12/05/16 0727 12/05/16 1229 12/05/16 1547  GLUCAP 93 87 80 75 71    Recent Results (from the past 240 hour(s))  Blood Culture (routine x 2)     Status: None (Preliminary result)   Collection Time: 12/13/2016 11:20 AM  Result Value Ref Range Status   Specimen Description BLOOD RIGHT FOREARM  Final   Special Requests IN PEDIATRIC BOTTLE 3CC  Final   Culture NO GROWTH 2 DAYS  Final   Report Status PENDING  Incomplete  Blood Culture (routine x 2)     Status: None (Preliminary result)  Collection Time: 12/14/2016 11:27 AM  Result Value Ref Range Status   Specimen Description BLOOD LEFT ANTECUBITAL  Final   Special Requests BOTTLES DRAWN AEROBIC ONLY 5CC  Final   Culture NO GROWTH 2 DAYS  Final   Report Status PENDING  Incomplete  Respiratory Panel by PCR     Status: None   Collection Time: 12/11/2016  2:06 PM  Result Value Ref Range Status   Adenovirus NOT DETECTED NOT DETECTED Final   Coronavirus 229E NOT DETECTED NOT DETECTED Final   Coronavirus HKU1 NOT DETECTED NOT DETECTED Final   Coronavirus NL63 NOT DETECTED NOT DETECTED Final   Coronavirus OC43 NOT DETECTED NOT DETECTED Final   Metapneumovirus NOT DETECTED NOT DETECTED Final   Rhinovirus / Enterovirus NOT DETECTED NOT DETECTED Final   Influenza A NOT DETECTED NOT DETECTED Final   Influenza B NOT DETECTED NOT DETECTED Final   Parainfluenza Virus 1 NOT DETECTED NOT DETECTED Final   Parainfluenza Virus 2 NOT DETECTED NOT DETECTED Final   Parainfluenza Virus 3 NOT DETECTED NOT DETECTED Final   Parainfluenza Virus 4 NOT DETECTED NOT DETECTED Final   Respiratory Syncytial Virus NOT DETECTED NOT DETECTED Final   Bordetella pertussis NOT DETECTED NOT DETECTED Final   Chlamydophila pneumoniae NOT DETECTED NOT DETECTED Final   Mycoplasma pneumoniae NOT DETECTED NOT DETECTED Final    MRSA PCR Screening     Status: None   Collection Time: 12/01/2016  7:17 PM  Result Value Ref Range Status   MRSA by PCR NEGATIVE NEGATIVE Final    Comment:        The GeneXpert MRSA Assay (FDA approved for NASAL specimens only), is one component of a comprehensive MRSA colonization surveillance program. It is not intended to diagnose MRSA infection nor to guide or monitor treatment for MRSA infections.      Scheduled Meds: . aspirin EC  81 mg Oral Daily  . atorvastatin  80 mg Oral QHS  . ceFEPime (MAXIPIME) IV  2 g Intravenous Q24H  . chlorhexidine  15 mL Mouth Rinse BID  . enoxaparin (LOVENOX) injection  1 mg/kg Subcutaneous Q12H  . feeding supplement (ENSURE ENLIVE)  237 mL Oral BID BM  . haloperidol lactate  1 mg Intramuscular Once  . insulin aspart  0-9 Units Subcutaneous TID WC  . ipratropium  0.5 mg Nebulization Q8H  . levalbuterol  0.63 mg Nebulization Q8H  . mouth rinse  15 mL Mouth Rinse q12n4p  . metoprolol succinate  50 mg Oral Daily  . omega-3 acid ethyl esters  1 g Oral BID  . polyethylene glycol  17 g Oral Daily  . sodium chloride flush  3 mL Intravenous Q12H     LOS: 2 days   Gerlean Ren, MD [facility] Office  [phone] Pager - Text Page per Shea Evans as per below:  On-Call/Text Page:      [email or web address]      password [email or web address]  If 7PM-7AM, please contact night-coverage [email or web address] Password [email or web address] 12/05/2016, 5:35 PM

## 2016-12-06 ENCOUNTER — Inpatient Hospital Stay (HOSPITAL_COMMUNITY): Payer: PPO

## 2016-12-06 DIAGNOSIS — E440 Moderate protein-calorie malnutrition: Secondary | ICD-10-CM | POA: Insufficient documentation

## 2016-12-06 LAB — BASIC METABOLIC PANEL
ANION GAP: 7 (ref 5–15)
BUN: 45 mg/dL — AB (ref 6–20)
CALCIUM: 12.1 mg/dL — AB (ref 8.9–10.3)
CO2: 32 mmol/L (ref 22–32)
Chloride: 103 mmol/L (ref 101–111)
Creatinine, Ser: 1.63 mg/dL — ABNORMAL HIGH (ref 0.61–1.24)
GFR calc Af Amer: 46 mL/min — ABNORMAL LOW (ref >60)
GFR, EST NON AFRICAN AMERICAN: 40 mL/min — AB (ref >60)
GLUCOSE: 85 mg/dL (ref 65–99)
POTASSIUM: 4.4 mmol/L (ref 3.5–5.1)
SODIUM: 142 mmol/L (ref 135–145)

## 2016-12-06 LAB — BODY FLUID CELL COUNT WITH DIFFERENTIAL
Eos, Fluid: 0 %
LYMPHS FL: 18 %
Monocyte-Macrophage-Serous Fluid: 24 % — ABNORMAL LOW (ref 50–90)
Neutrophil Count, Fluid: 58 % — ABNORMAL HIGH (ref 0–25)
Total Nucleated Cell Count, Fluid: 171 cu mm (ref 0–1000)

## 2016-12-06 LAB — CBC
HEMATOCRIT: 25.6 % — AB (ref 39.0–52.0)
HEMOGLOBIN: 8.1 g/dL — AB (ref 13.0–17.0)
MCH: 30.5 pg (ref 26.0–34.0)
MCHC: 31.6 g/dL (ref 30.0–36.0)
MCV: 96.2 fL (ref 78.0–100.0)
Platelets: 139 10*3/uL — ABNORMAL LOW (ref 150–400)
RBC: 2.66 MIL/uL — ABNORMAL LOW (ref 4.22–5.81)
RDW: 13.7 % (ref 11.5–15.5)
WBC: 18.7 10*3/uL — ABNORMAL HIGH (ref 4.0–10.5)

## 2016-12-06 LAB — AMYLASE, PLEURAL OR PERITONEAL FLUID: AMYLASE FL: 9 U/L

## 2016-12-06 LAB — PSA: PSA: 0.8 ng/mL (ref 0.00–4.00)

## 2016-12-06 LAB — GLUCOSE, CAPILLARY
GLUCOSE-CAPILLARY: 63 mg/dL — AB (ref 65–99)
GLUCOSE-CAPILLARY: 77 mg/dL (ref 65–99)
Glucose-Capillary: 95 mg/dL (ref 65–99)
Glucose-Capillary: 81 mg/dL (ref 65–99)
Glucose-Capillary: 89 mg/dL (ref 65–99)

## 2016-12-06 LAB — PROTIME-INR
INR: 1.52
Prothrombin Time: 18.4 seconds — ABNORMAL HIGH (ref 11.4–15.2)

## 2016-12-06 LAB — PROTEIN, PLEURAL OR PERITONEAL FLUID

## 2016-12-06 LAB — GLUCOSE, PLEURAL OR PERITONEAL FLUID: GLUCOSE FL: 77 mg/dL

## 2016-12-06 LAB — HEPARIN LEVEL (UNFRACTIONATED): Heparin Unfractionated: 1.66 IU/mL — ABNORMAL HIGH (ref 0.30–0.70)

## 2016-12-06 LAB — LACTATE DEHYDROGENASE, PLEURAL OR PERITONEAL FLUID: LD FL: 222 U/L — AB (ref 3–23)

## 2016-12-06 LAB — APTT: aPTT: 133 seconds — ABNORMAL HIGH (ref 24–36)

## 2016-12-06 MED ORDER — LORAZEPAM 2 MG/ML IJ SOLN
0.5000 mg | Freq: Once | INTRAMUSCULAR | Status: AC
  Administered 2016-12-06: 0.5 mg via INTRAVENOUS
  Filled 2016-12-06: qty 1

## 2016-12-06 MED ORDER — HEPARIN (PORCINE) IN NACL 100-0.45 UNIT/ML-% IJ SOLN
1000.0000 [IU]/h | INTRAMUSCULAR | Status: DC
  Administered 2016-12-06: 1000 [IU]/h via INTRAVENOUS

## 2016-12-06 MED ORDER — FUROSEMIDE 10 MG/ML IJ SOLN
80.0000 mg | Freq: Once | INTRAMUSCULAR | Status: AC
Start: 2016-12-06 — End: 2016-12-06
  Administered 2016-12-06: 80 mg via INTRAVENOUS
  Filled 2016-12-06: qty 8

## 2016-12-06 MED ORDER — MIDAZOLAM HCL 2 MG/2ML IJ SOLN
INTRAMUSCULAR | Status: AC | PRN
  Administered 2016-12-06: 1 mg via INTRAVENOUS

## 2016-12-06 MED ORDER — MIDAZOLAM HCL 2 MG/2ML IJ SOLN
INTRAMUSCULAR | Status: AC
  Filled 2016-12-06: qty 2

## 2016-12-06 MED ORDER — FENTANYL CITRATE (PF) 100 MCG/2ML IJ SOLN
INTRAMUSCULAR | Status: AC
  Filled 2016-12-06: qty 2

## 2016-12-06 MED ORDER — ALBUMIN HUMAN 5 % IV SOLN
25.0000 g | Freq: Once | INTRAVENOUS | Status: AC
  Administered 2016-12-06: 25 g via INTRAVENOUS
  Filled 2016-12-06: qty 250

## 2016-12-06 MED ORDER — METOPROLOL TARTRATE 5 MG/5ML IV SOLN
5.0000 mg | INTRAVENOUS | Status: DC | PRN
  Administered 2016-12-06 – 2016-12-07 (×3): 5 mg via INTRAVENOUS
  Filled 2016-12-06 (×3): qty 5

## 2016-12-06 MED ORDER — LORAZEPAM 2 MG/ML IJ SOLN
0.5000 mg | Freq: Once | INTRAMUSCULAR | Status: AC
  Administered 2016-12-07: 0.5 mg via INTRAVENOUS
  Filled 2016-12-06: qty 1

## 2016-12-06 MED ORDER — DEXTROSE 50 % IV SOLN
INTRAVENOUS | Status: AC
  Administered 2016-12-06: 50 mL
  Filled 2016-12-06: qty 50

## 2016-12-06 MED ORDER — FENTANYL CITRATE (PF) 100 MCG/2ML IJ SOLN
INTRAMUSCULAR | Status: AC | PRN
  Administered 2016-12-06: 25 ug via INTRAVENOUS

## 2016-12-06 MED ORDER — HEPARIN (PORCINE) IN NACL 100-0.45 UNIT/ML-% IJ SOLN
850.0000 [IU]/h | INTRAMUSCULAR | Status: DC
  Administered 2016-12-06: 1000 [IU]/h via INTRAVENOUS
  Administered 2016-12-07: 850 [IU]/h via INTRAVENOUS
  Filled 2016-12-06: qty 250

## 2016-12-06 MED ORDER — LIDOCAINE HCL 1 % IJ SOLN
INTRAMUSCULAR | Status: AC
  Filled 2016-12-06: qty 20

## 2016-12-06 NOTE — Progress Notes (Addendum)
[place] TEAM 1 - Stepdown/ICU TEAM  Curtis Galloway  PZW:[id number] DOB: Nov 30, 1940 DOA: 12/24/2016 PCP: Binnie Rail, MD    Brief Narrative:  76 y.o. male with history of CAD s/p CABG, HTN, and GERD who was recently admitted to the [facility] with Influenza A and completed a course of Tamiflu. He was discharged home on supplemental O2. Since his discharge home patient had been feeling weak and SOB.  Patient was seen by his PCP and a CXR noted pneumonia vs interstitial edema w/ a new opacity in the left upper lobe suspicious for a loculated effusion.  When his sx did not improve his family contacted EMS and he was brought to the ED.  In the ED his oxygen saturation was 89% on RA, but patient had increased work of breathing and was agitated with increasing shortness of breath.  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  Subjective: Patient was unable to get MRI of the brain last night as he became short of breath when he laid down. He does appear a little bit agitated this morning but unable to communicate was exactly  bothering him. family is present at bedside and all the questions have been answered.    Assessment & Plan:  Acute hypoxic respiratory failure-multifactorial  Now off BIPAP w/ good sats - PNA v/s Lung CA v/s pneumonitis (recent Flu A) versus pulmonary embolism versus bilateral pleural effusion  - cont empiric abx tx for now  Cultures remain negative at this time Supplemental oxygen Ultrasound-guided thoracentesis ordered today-diagnostic/therapeutic purpose. It this doesn't improve his heart rate I will give him IV fluids as his creatinine has trended up a little  bit. Lasix was given this morning as he had severe rales on physical exam   Newly diagnosed lung process - CA likely  CT of the chest show patient has acute pulmonary embolism along with hilar infiltrates. CT abdomen pelvis done which showed previous nephrectomy, nonobstructive renal stone, lumbar lytic lesion. -She will get left iliac crest biopsy and ultrasound-guided thoracentesis for tissue diagnosis. -Appreciate Dr. Ernestina Penna imput -Further care to be determined by pathology report - will benefit from palliative care consult once we have dx and have determined further care plan.   Sinus Tachycardia: PE vs intravascular vol depletion vs fluid overload causing resp distress  ekg shows sinus tachycardia.  If his BP drops, will likely give him Albumin as his previous level 2 days ago was 2.0.  Acute encephalopathy -Unknown etiology as it could be from brain metastases versus underlying infection versus pain -Continue IV antibiotics, supportive care, pain control and get MRI of brain when possible.   Anemia - unknown etiology  -will check heme occult stool for any GI losses. Monitor Hb for now.   Cervical spine pain Pain control. Radiologic imaging-MRI if possible. -Bowel regimen as needed   Pulmonary embolism -Heparin drip   Acute Kidney injury - -Avoid nephrotoxic drugs. Continue to monitor.  Recent Labs Lab 12/02/16 1710 12/16/2016 1123 12/04/16 0621 12/05/16 0851 12/06/16 0813  CREATININE 1.36 1.48* 1.58* 1.57* 1.63*    Hx Renal Cell CA s/p L nephrectomy 2004  DM type II A1C April 2017 6.4% - CBG currently well-controlled  CAD s/p CABG in 2015 No anginal complaints -  reduced Aspirin to 81 mg given current Lovenox therapy for PE  Hyperlipidemia  Continue Lipitor   DVT prophylaxis:Heparin drip Code Status: DO NOT INTUBATE  Family Communication: Spoke with wife, son,at bedside  Disposition Plan: SDU  Consultants:  Heme onc: Dr Burr Medico   IR  Procedures: None  Antimicrobials:  Maxipime 3/6 > Vancomycin 3/6 >  Objective: Blood pressure (!) 95/50, pulse (!) 128, temperature 99.1 F (37.3 C), temperature source Axillary, resp. rate 15, height '5\' 9"'$  (1.753 m), weight 78.6 kg (173 lb 4.5 oz), SpO2 100 %.  Intake/Output Summary (Last 24 hours) at 12/06/16 1341 Last data filed at 12/06/16 1300  Gross per 24 hour  Intake          2067.16 ml  Output             1325 ml  Net           742.16 ml   Filed Weights   12/04/16 0350 12/05/16 0500 12/06/16 0404  Weight: 77.1 kg (169 lb 15.6 oz) 77.9 kg (171 lb 11.8 oz) 78.6 kg (173 lb 4.5 oz)    Examination: General: no acute respiratory distress - agitated and confused  Lungs: mild crackles th/o - no wheezing  Cardiovascular:  regular - no gallup or rub  Abdomen: Nontender, nondistended, soft, bowel sounds positive, no rebound, no ascites, no appreciable mass Extremities: No significant cyanosis, clubbing, or edema bilateral lower extremities  CBC:  Recent Labs Lab 12/02/16 1710 12/17/2016 1123 12/04/16 0621 12/05/16 0851 12/06/16 0307  WBC 22.2 cH* 20.3* 22.6* 18.6* 18.7*  NEUTROABS 18.6* 16.7* 20.3*  --   --   HGB 13.0 12.8* 10.3* 8.8* 8.1*  HCT 39.8 39.9 32.6* 27.2* 25.6*  MCV 92.8 95.0 95.0 96.1 96.2  PLT 402.0* 330 319 286 867*   Basic Metabolic Panel:  Recent Labs Lab 12/02/16 1710 12/27/2016 1123 12/04/16 0621 12/05/16 0851 12/06/16 0813  NA 143 143 141 141 142  K 4.2 4.3 4.3 4.2 4.4  CL 98 100* 103 103 103  CO2 37* '29 30 28 '$ 32  GLUCOSE 121* 102* 115* 76 85  BUN 25* 28* 35* 47* 45*  CREATININE 1.36 1.48* 1.58* 1.57* 1.63*  CALCIUM 12.6* 13.4* 12.3* 11.6* 12.1*   GFR: Estimated Creatinine Clearance: 39.8 mL/min (by C-G formula based on SCr of 1.63 mg/dL (H)).  Liver Function Tests:  Recent Labs Lab 12/02/16 1710 12/01/2016 1123 12/05/16 0851  AST 18 32 29  ALT '22 24 29  '$ ALKPHOS 91 87 60  BILITOT 0.5 0.6 0.7  PROT 5.9* 6.1* 4.7*   ALBUMIN 2.8* 2.3* 2.0*    HbA1C: Hgb A1c MFr Bld  Date/Time Value Ref Range Status  01/11/2016 09:48 AM 6.4 4.6 - 6.5 % Final    Comment:    Glycemic Control Guidelines for People with Diabetes:Non Diabetic:  <6%Goal of Therapy: <7%Additional Action Suggested:  >8%   06/30/2015 12:11 PM 6.2 4.6 - 6.5 % Final    Comment:    Glycemic Control Guidelines for People with Diabetes:Non Diabetic:  <6%Goal of Therapy: <7%Additional Action Suggested:  >8%     CBG:  Recent Labs Lab 12/05/16 1547 12/05/16 2017 12/06/16 0044 12/06/16 0114 12/06/16 0842  GLUCAP 71 82 63* 95 81    Recent Results (from the past 240 hour(s))  Blood Culture (routine x 2)     Status: None (Preliminary result)   Collection Time: 12/16/2016 11:20 AM  Result Value Ref Range Status   Specimen Description BLOOD RIGHT FOREARM  Final   Special Requests IN PEDIATRIC BOTTLE 3CC  Final   Culture NO GROWTH 3 DAYS  Final   Report Status PENDING  Incomplete  Blood Culture (routine x 2)     Status: None (Preliminary result)   Collection Time: 12/27/2016 11:27 AM  Result Value Ref Range Status   Specimen Description BLOOD LEFT ANTECUBITAL  Final   Special Requests BOTTLES DRAWN AEROBIC ONLY 5CC  Final   Culture NO GROWTH 3 DAYS  Final   Report Status PENDING  Incomplete  Respiratory Panel by PCR     Status: None   Collection Time: 12/21/2016  2:06 PM  Result Value Ref Range Status   Adenovirus NOT DETECTED NOT DETECTED Final   Coronavirus 229E NOT DETECTED NOT DETECTED Final   Coronavirus HKU1 NOT DETECTED NOT DETECTED Final   Coronavirus NL63 NOT DETECTED NOT DETECTED Final   Coronavirus OC43 NOT DETECTED NOT DETECTED Final   Metapneumovirus NOT DETECTED NOT DETECTED Final   Rhinovirus / Enterovirus NOT DETECTED NOT DETECTED Final   Influenza A NOT DETECTED NOT DETECTED Final   Influenza B NOT DETECTED NOT DETECTED Final   Parainfluenza Virus 1 NOT DETECTED NOT DETECTED Final   Parainfluenza Virus 2 NOT DETECTED NOT  DETECTED Final   Parainfluenza Virus 3 NOT DETECTED NOT DETECTED Final   Parainfluenza Virus 4 NOT DETECTED NOT DETECTED Final   Respiratory Syncytial Virus NOT DETECTED NOT DETECTED Final   Bordetella pertussis NOT DETECTED NOT DETECTED Final   Chlamydophila pneumoniae NOT DETECTED NOT DETECTED Final   Mycoplasma pneumoniae NOT DETECTED NOT DETECTED Final  MRSA PCR Screening     Status: None   Collection Time: 12/20/2016  7:17 PM  Result Value Ref Range Status   MRSA by PCR NEGATIVE NEGATIVE Final    Comment:        The GeneXpert MRSA Assay (FDA approved for NASAL specimens only), is one component of a comprehensive MRSA colonization surveillance program. It is not intended to diagnose MRSA infection nor to guide or monitor treatment for MRSA infections.      Scheduled Meds: . fentaNYL      . lidocaine      . midazolam      . aspirin EC  81 mg Oral Daily  . atorvastatin  80 mg Oral QHS  . ceFEPime (MAXIPIME) IV  2 g Intravenous Q24H  . chlorhexidine  15 mL Mouth Rinse BID  . feeding supplement (ENSURE ENLIVE)  237 mL Oral BID BM  . insulin aspart  0-9 Units Subcutaneous TID WC  . ipratropium  0.5 mg Nebulization Q8H  . levalbuterol  0.63 mg Nebulization Q8H  . mouth rinse  15 mL Mouth Rinse q12n4p  . metoprolol succinate  50 mg Oral Daily  . omega-3 acid ethyl esters  1 g Oral BID  . polyethylene glycol  17 g Oral Daily  . sodium chloride flush  3 mL Intravenous Q12H     LOS: 3 days   Curtis Ren, MD [facility] Office  [phone] Pager - Text Page per Shea Evans as per below:  On-Call/Text Page:      [email or web address]      password [email or web address]  If 7PM-7AM, please contact night-coverage [email or web address] Password [email or web address] 12/06/2016, 1:41 PM

## 2016-12-06 NOTE — Consult Note (Signed)
Chief Complaint: Patient was seen in consultation today for shortness of breath, bone lesions  Referring Physician(s):  Dr. Truitt Merle  Supervising Physician: Jacqulynn Cadet  Patient Status: [facility] - In-pt  History of Present Illness: Curtis Galloway is a 76 y.o. male with past medical history of CAD, GERD, HLD, HTN who presented to [facility] ED with shortness of breath and weakness.  Patient also recently diagnosed with the flu.   CT Chest 12/01/2016 showed: 1. Diffuse interstitial thickening in the LEFT lung with loculated fluid at the LEFT lung apex. 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 2. RIGHT hilar adenopathy and infrahilar nodal thickening as well as a large mediastinal nodes is concerning for metastatic adenopathy. 3. Small filling defect within the RIGHT lower lobe pulmonary nodule consistent with PULMONARY EMBOLISM. This etiology of embolism may be local compression of the artery by the RIGHT infrahilar adenopathy. Cannot exclude peripheral deep venous thrombosis. 4. 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 5. Patient has a history of LEFT nephrectomy. Differential for malignancy would include bronchogenic carcinoma versus metastatic renal cell carcinoma.  IR consulted for bone lesion biopsy, bone marrow biopsy, and thoracentesis at the request of Dr. Truitt Merle. Dr. Pascal Lux reviewed case and feels patient is appropriate.   Patient has been NPO.   His heparin was held at 845AM today.  Patient is very restless during PA visit.   Past Medical History:  Diagnosis Date  . Abnormal nuclear stress test 11/16/2013  . Coronary artery disease    Dr. Johnsie Cancel,  stent to LAD  . GERD (gastroesophageal reflux disease)   . Hyperlipidemia   . 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 Hypertension     Past Surgical History:  Procedure Laterality Date  . APPENDECTOMY    . CARDIAC CATHETERIZATION  11/01/2011   Dr Johnsie Cancel  . CARDIAC  CATHETERIZATION N/A 03/16/2015   Procedure: Left Heart Cath and Cors/Grafts Angiography;  Surgeon: Josue Hector, MD;  Location: [facility];  Service: Cardiovascular;  Laterality: N/A;  . CERVICAL LAMINECTOMY     x 1  . colonoscopy with polypectomy  2014   Dr Paulita Fujita  . colonscopy  2006   Dr. Delfin Edis; negative  . CORONARY ANGIOPLASTY WITH STENT PLACEMENT  1990's; 11/15/2013   "today's make a total of 3" (11/15/2013)  . CORONARY ANGIOPLASTY WITH STENT PLACEMENT  11/15/13   Promus to prox. LAD  . CORONARY ARTERY BYPASS GRAFT  2000   Dr. Roxan Hockey 2001  . KNEE SURGERY  3-4 surgeries   bone moved from hip to under knee cap  . LEFT HEART CATHETERIZATION WITH CORONARY/GRAFT ANGIOGRAM  11/01/2011   Procedure: LEFT HEART CATHETERIZATION WITH Beatrix Fetters;  Surgeon: Josue Hector, MD;  Location: [facility] CATH LAB;  Service: Cardiovascular;;  . LEFT HEART CATHETERIZATION WITH CORONARY/GRAFT ANGIOGRAM N/A 11/15/2013   Procedure: LEFT HEART CATHETERIZATION WITH Beatrix Fetters;  Surgeon: Burnell Blanks, MD;  Location: [facility] CATH LAB;  Service: Cardiovascular;  Laterality: N/A;  . LUMBAR FUSION  06/08/2013   [place]  . LUMBAR LAMINECTOMY     x 4  . 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 TONSILLECTOMY      Allergies: Codeine and Tramadol  Medications: Prior to Admission medications   Medication Sig Start Date End Date Taking? Authorizing Provider  aspirin 325 MG tablet Take 325 mg by mouth daily.   Yes Historical Provider, MD  atorvastatin (LIPITOR) 80 MG  tablet Take 1 tablet (80 mg total) by mouth at bedtime. 10/18/16  Yes Josue Hector, MD  metoprolol succinate (TOPROL-XL) 50 MG 24 hr tablet Take 1 tablet (50 mg total) by mouth daily. Take with Metoprolol 25 mg tablet 10/18/16  Yes Josue Hector, MD  Multiple Vitamin (MULTIVITAMIN) tablet Take 1 tablet by mouth daily.     Yes Historical Provider, MD  Omega-3 Fatty Acids (FISH OIL PO) Take 1 capsule by mouth  daily. Mega Red brand   Yes Historical Provider, MD  nitroGLYCERIN (NITROSTAT) 0.4 MG SL tablet Place 1 tablet (0.4 mg total) under the tongue every 5 (five) minutes as needed for chest pain. 03/15/15   Josue Hector, MD     Family History  Problem Relation Age of Onset  . Heart attack Father 22  . Diabetes Mother   . Coronary artery disease Mother   . Heart attack Brother 66  . Heart attack Brother     in his 64s  . Cancer Maternal Grandmother     uterine cancer  . Coronary artery disease Other     M & P aunts/uncles  . Stroke Neg Hx     Social History   Social History  . Marital status: Married    Spouse name: N/A  . Number of children: N/A  . Years of education: N/A   Social History Main Topics  . Smoking status: Former Smoker    Packs/day: 1.00    Years: 38.00    Types: Cigarettes    Quit date: 10/01/1996  . Smokeless tobacco: Never Used     Comment: smoked 1960-1998, up 1 ppd  . Alcohol use No  . Drug use: No  . Sexual activity: Yes   Other Topics Concern  . None   Social History Narrative   Retired   Married   Former smoker   Alcohol use-no   Regular exercise-no     Review of Systems  Unable to perform ROS: Mental status change    Vital Signs: BP 123/68 (BP Location: Left Arm)   Pulse (!) 120   Temp 99.1 F (37.3 C) (Axillary)   Resp 18   Ht 5' 9"  (1.753 m)   Wt 173 lb 4.5 oz (78.6 kg)   SpO2 100%   BMI 25.59 kg/m   Physical Exam  Constitutional: He appears well-developed.  agitated  Cardiovascular: Normal rate, regular rhythm and normal heart sounds.   Pulmonary/Chest: Effort normal and breath sounds normal. No respiratory distress.  Neurological: He is alert.  Restless, not oriented, does not respond to questions  Skin: Skin is warm and dry.  Psychiatric: He has a normal mood and affect. His behavior is normal. Judgment and thought content normal.  Nursing note and vitals reviewed.   Mallampati Score:  MD Evaluation Airway:  WNL Heart: WNL Abdomen: WNL Chest/ Lungs: WNL ASA  Classification: 3 Mallampati/Airway Score: Two  Imaging: Dg Chest 2 View  Result Date: 12/26/2016 CLINICAL DATA:  Congestion, cough, history of recent pneumonia, former smoking history EXAM: CHEST  2 VIEW COMPARISON:  Chest x-ray of 11/22/2016 FINDINGS: Prominent coarse interstitial markings again are noted bilaterally left much greater than right with bilateral pleural effusions. There is now and opacity within the left lung apex which has developed since the chest x-ray 11/22/2016, therefore most consistent with loculated effusion. CT the chest is recommended to evaluate in more detail with IV contrast media. Mediastinal and hilar contours are unchanged and the heart is within normal  limits in size. Median sternotomy sutures are noted from prior CABG. 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 IMPRESSION: 1. Worsening of coarse interstitial lung markings left-greater-than-right. Possible pneumonia as viral pneumonia versus interstitial edema with effusions. 2. New opacity in the left lung apex most consistent with loculated effusion in view of the rapid development. Recommend CT of the chest with IV contrast media. 3. Changes of asbestos related disease. Electronically Signed   By: Ivar Drape M.D.   On: 12/15/2016 08:24   Dg Chest 2 View  Result Date: 11/22/2016 CLINICAL DATA:  Shortness of breath.  Chest pain . EXAM: CHEST  2 VIEW COMPARISON:  08/28/2015 FINDINGS: Prior CABG. Heart size normal. No pulmonary venous congestion. Bilateral pulmonary interstitial prominence noted, particularly prominent on the left. Pneumonitis could present in this fashion. Interstitial edema could present in this fashion. 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 Similar findings noted on prior exam. No pneumothorax. No acute bony abnormality. IMPRESSION: 1. Bilateral pulmonary interstitial prominence,  left side greater right. Bilateral pneumonitis and/or interstitial edema could present in this fashion. 2. Bilateral pleural thickening with calcified pleural plaques again noted. 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 3. Prior CABG.  Heart size stable. Electronically Signed   By: Marcello Moores  Register   On: 11/22/2016 10:41   Dg Cervical Spine Complete  Result Date: 11/18/2016 CLINICAL DATA:  Ongoing neck pain for several weeks. No reported trauma. EXAM: CERVICAL SPINE - COMPLETE 4+ VIEW COMPARISON:  None. FINDINGS: No traumatic malalignment. There is straightening of normal lordosis. The pre odontoid space and prevertebral soft tissues are normal. Multilevel degenerative changes are seen with anterior osteophytes and loss of disc space particularly at C4-5 and C6-7. The neural foramina are patent. The lateral masses of C1 align with C2. The odontoid process is unremarkable. Chronic calcifications are seen in the neck. IMPRESSION: 1. Moderate degenerative changes in the cervical spine. 2. Chronic calcifications in the neck. 3. No other acute abnormalities. Electronically Signed   By: Dorise Bullion III M.D   On: 11/18/2016 16:12   Ct Chest W Contrast  Result Date: 11/29/2016 CLINICAL DATA:  Productive cough, worsening short of breath EXAM: CT CHEST WITH CONTRAST TECHNIQUE: Multidetector CT imaging of the chest was performed during intravenous contrast administration. CONTRAST:  64m ISOVUE-300 IOPAMIDOL (ISOVUE-300) INJECTION 61% COMPARISON:  CT 04/11/2016 FINDINGS: Cardiovascular: Post CABG.  No pericardial fluid. Within the RIGHT lower lobe pulmonary artery there is a thin tubular branching filling defect (image 74 through 79 series 201). There is extensive hilar adenopathy at this same level with external compression of the pulmonary artery (image 75, series 601) Mediastinum/Nodes: No axillary or supraclavicular adenopathy. Subcarinal lymph node measures 22 mm. RIGHT lower paratracheal node  measures 12 mm. RIGHT hilar nodal complex measures 3.2 cm. RIGHT infrahilar nodal complex measures 2.7 cm (image 35 series 6). The mild perihilar thickening on the LEFT Lungs/Pleura: There are bilateral small effusions. Effusions extend along the oblique fissures and partially loculated fluid at the LEFT lung apex. There is interstitial thickening extending from the hilum within the LEFT upper lobe and LEFT lower lobe. Within the RIGHT lung scattered interstitial thickening and peripheral nodularity. No discrete measurable nodule. Calcification over the RIGHT diaphragm. Upper Abdomen: Limited view of the liver, kidneys, pancreas are unremarkable. Normal adrenal glands. Musculoskeletal: Multiple rounded lucent lesions within the spine. For example multiple 5 to 7 mm lesions on image 132, series 601 in the lower thoracic spines. Similar lesions in  the upper thoracic spine (image 30, series 201) measuring 9 mm. Lytic lesion in the LEFT aspect of the manubrium. IMPRESSION: 1. Diffuse interstitial thickening in the LEFT lung with loculated fluid at the LEFT lung apex. 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 2. RIGHT hilar adenopathy and infrahilar nodal thickening as well as a large mediastinal nodes is concerning for metastatic adenopathy. 3. Small filling defect within the RIGHT lower lobe pulmonary nodule consistent with PULMONARY EMBOLISM. This etiology of embolism may be local compression of the artery by the RIGHT infrahilar adenopathy. Cannot exclude peripheral deep venous thrombosis. 4. 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 5. Patient has a history of LEFT nephrectomy. Differential for malignancy would include bronchogenic carcinoma versus metastatic renal cell carcinoma. Findings conveyed toLiu, MDon 12/25/2016  at14:48. Electronically Signed   By: Suzy Bouchard M.D.   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 Concern for metastatic renal cancer. EXAM: CT ABDOMEN AND PELVIS WITH CONTRAST TECHNIQUE: Multidetector CT imaging of the abdomen and pelvis was performed using the standard protocol following bolus administration of intravenous contrast. CONTRAST:  100 cc Isovue-300 COMPARISON:  12/08/2016 chest CT, CT abdomen and pelvis 04/11/2016 FINDINGS: Lower chest: Bilateral pleural effusions left greater than right as on recent chest CT with calcified and noncalcified pleural plaque in keeping with history of prior asbestos exposure most prominently involving the right diaphragmatic pleura Normal visualized cardiac chambers. No pericardial effusion. Status post median sternotomy. Coronary arteriosclerosis is identified. Changes of paraseptal emphysema. Bilateral lower lobe bronchiectasis and peribronchial thickening. Interstitial thickening is seen within the visualized lower lobes as before. Hepatobiliary: Cholelithiasis without complication. Homogeneous attenuation of liver. No biliary dilatation. Pancreas: Atrophic pancreas.  No ductal dilatation or mass. Spleen: Normal normal Adrenals/Urinary Tract: Compensatory hypertrophy of the right kidney without enhancing mass. Status post left nephrectomy. Renovascular calcifications are noted at the hilum. Nonobstructing calculi are also present in the interpolar aspect versus vascular calcifications. No enhancing mass of the right kidney. Chronic mild thickening of the left adrenal gland. Stomach/Bowel: Contrast distended stomach. Normal small bowel rotation without inflammation or obstruction. Moderate colonic stool burden within large bowel without obstruction or inflammation. Appendix is not visualized but no pericecal inflammation is noted. Vascular/Lymphatic: Aortoiliac atherosclerosis without aneurysm. Calcified mesenteric lymph node in left upper quadrant. Reproductive: Normal size  prostate. Other: No abdominal wall hernia or abnormality. No abdominopelvic ascites. Musculoskeletal: Posterior fusion hardware from L3 through L5 with spinal decompression and interbody blocks noted. Osteolytic metastatic foci along the visualized lower thoracic and lumbar spine from T10 caudad to S1. Osteolytic lesions of the iliac bones bilaterally, left parasymphysis, right inferior pubic ramus, left ischium IMPRESSION: 1. 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 Pre-existing posterior lumbar decompression and fusion from L3 through L5. 2. Status post left nephrectomy. Compensatory hypertrophy of the right kidney without focal mass or obstruction. 3. Uncomplicated cholelithiasis. 4. Nonobstructing right-sided nephrolithiasis and renovascular calcifications. 5. Small left greater than right pleural effusions with changes secondary to asbestos exposure. Interstitial prominence at each base cannot exclude lymphangitic spread of malignancy. 6. Chronic thickened appearance of the left adrenal gland. Electronically Signed   By: Ashley Royalty M.D.   On: 12/04/2016 21:04    Labs:  CBC:  Recent Labs  12/18/2016 1123 12/04/16 0621 12/05/16 0851 12/06/16 0307  WBC 20.3* 22.6* 18.6* 18.7*  HGB 12.8* 10.3* 8.8* 8.1*  HCT 39.9 32.6* 27.2* 25.6*  PLT 330 319 286 139*    COAGS:  Recent Labs  12/04/16 1513 12/06/16 0300  INR 1.29 1.52  APTT 44* 133*    BMP:  Recent Labs  12/13/2016 1123 12/04/16 0621 12/05/16 0851 12/06/16 0813  NA 143 141 141 142  K 4.3 4.3 4.2 4.4  CL 100* 103 103 103  CO2 29 30 28  32  GLUCOSE 102* 115* 76 85  BUN 28* 35* 47* 45*  CALCIUM 13.4* 12.3* 11.6* 12.1*  CREATININE 1.48* 1.58* 1.57* 1.63*  GFRNONAA 45* 41* 42* 40*  GFRAA 52* 48* 48* 46*    LIVER FUNCTION TESTS:  Recent Labs  01/11/16 0948 12/02/16 1710 12/10/2016 1123 12/05/16 0851  BILITOT 0.4 0.5 0.6 0.7  AST 18 18 32 29  ALT 20 22 24 29   ALKPHOS 72 91 87 60   PROT 7.2 5.9* 6.1* 4.7*  ALBUMIN 4.0 2.8* 2.3* 2.0*    TUMOR MARKERS: No results for input(s): AFPTM, CEA, CA199, CHROMGRNA in the last 8760 hours.  Assessment and Plan: Bone lesions Patient with diffuse lytic bone lesions concerning for malignancy.   IR consulted for bone lesion and bone marrow biopsy. Dr. Pascal Lux reviewed case and approved patient for procedure.  Patient has been NPO.  Heparin stopped at 845AM.  Risks and Benefits discussed with the patient and family including, but not limited to bleeding, infection, damage to adjacent structures or low yield requiring additional tests. All of the patient's family's questions were answered, patient/family is agreeable to proceed. Consent signed and in chart. Discussed plan with family.  They are aware of scheduling needs with pathology for bone marrow biopsy.  Ok to proceed with bone biopsy today and continue to pursue bone marrow biopsy if cannot get sample to pathology as is required.   Pleural effusion, left Patient with left pleural effusion.  Request is made for thoracentesis.  Patient is restless and agitated.  RN has paged MD for medication for patient.  Consent signed and in chart.   Thank you for this interesting consult.  I greatly enjoyed meeting GERRELL TABET and look forward to participating in their care.  A copy of this report was sent to the requesting provider on this date.  Electronically Signed: Docia Barrier 12/06/2016, 9:39 AM   I spent a total of 40 Minutes    in face to face in clinical consultation, greater than 50% of which was counseling/coordinating care for bone lesion, shortness of breath.

## 2016-12-06 NOTE — Progress Notes (Addendum)
Initial Nutrition Assessment  DOCUMENTATION CODES:   Non-severe (moderate) malnutrition in context of chronic illness  INTERVENTION:    If supportive care desired, recommend nutrition support initiation via CORTRAK small bore feeding tube  NUTRITION DIAGNOSIS:   Malnutrition related to chronic illness as evidenced by moderate depletions of muscle mass, moderate depletion of body fat  GOAL:   Patient will meet greater than or equal to 90% of their needs  MONITOR:   Diet advancement, PO intake, Labs, Weight trends, I & O's, Goals of care  REASON FOR ASSESSMENT:   Malnutrition Screening Tool, Consult  ASSESSMENT:   76 y.o. Male with history of CAD s/p CABG, HTN, and GERD who was recently admitted to the [facility] with Influenza A and completed a course of Tamiflu. He was discharged home on supplemental O2. Since his discharge home patient had been feeling weak and SOB.  Patient was seen by his PCP and a CXR noted pneumonia vs interstitial edema w/ a new opacity in the left upper lobe suspicious for a loculated effusion.  When his sx did not improve his family contacted EMS and he was brought to the ED.    RD spoke with family at bedside; pt restless and encephalopathic.  Wife reports pt hasn't eaten anything since Monday 3/5. IR consulted for bone lesion biopsy, bone marrow biopsy and thoracentesis. Pt may need initiation of short term nutrition support. Labs and medications reviewed. CBG's [id number].  Nutrition-Focused physical exam completed. Findings are moderate fat depletion, moderate muscle depletion, and no edema.   Diet Order:  Diet NPO time specified  Skin:  Reviewed, no issues  Last BM:  3/5  Height:   Ht Readings from Last 1 Encounters:  12/05/2016 5' 9"  (1.753 m)    Weight:   Wt Readings from Last 1 Encounters:  12/06/16 173 lb 4.5 oz (78.6 kg)   Wt Readings from Last 10 Encounters:  12/06/16 173 lb 4.5 oz (78.6 kg)  12/02/16 167 lb (75.8 kg)   11/22/16 158 lb 8 oz (71.9 kg)  10/18/16 169 lb (76.7 kg)  09/12/16 172 lb (78 kg)  03/20/16 170 lb (77.1 kg)  01/11/16 173 lb (78.5 kg)  09/18/15 170 lb (77.1 kg)  07/10/15 167 lb (75.8 kg)  06/28/15 172 lb 12.8 oz (78.4 kg)    Ideal Body Weight:  73 kg  BMI:  Body mass index is 25.59 kg/m.  Estimated Nutritional Needs:   Kcal:  1700-1900  Protein:  75-85 gm  Fluid:  1.7-1.9 L  EDUCATION NEEDS:   No education needs identified at this time  Arthur Holms, RD, LDN Pager #: [phone] After-Hours Pager #: [phone]

## 2016-12-06 NOTE — Progress Notes (Signed)
ANTICOAGULATION CONSULT NOTE - Follow Up Consult  Pharmacy Consult for Heparin; Cefepime Indication: pulmonary embolus; pneumonia  Allergies  Allergen Reactions  . Codeine Nausea Only    severe  . Tramadol Nausea Only    severe    Patient Measurements: Height: '5\' 9"'$  (175.3 cm) Weight: 173 lb 4.5 oz (78.6 kg) IBW/kg (Calculated) : 70.7  Vital Signs: Temp: 99.1 F (37.3 C) (03/09 0844) Temp Source: Axillary (03/09 0844) BP: 95/50 (03/09 1339) Pulse Rate: 128 (03/09 1339)  Labs:  Recent Labs  12/04/16 0621 12/04/16 1513 12/05/16 0851 12/06/16 0300 12/06/16 0307 12/06/16 0813  HGB 10.3*  --  8.8*  --  8.1*  --   HCT 32.6*  --  27.2*  --  25.6*  --   PLT 319  --  286  --  139*  --   APTT  --  44*  --  133*  --   --   LABPROT  --  16.2*  --  18.4*  --   --   INR  --  1.29  --  1.52  --   --   HEPARINUNFRC  --   --   --  1.66*  --   --   CREATININE 1.58*  --  1.57*  --   --  1.63*    Estimated Creatinine Clearance: 39.8 mL/min (by C-G formula based on SCr of 1.63 mg/dL (H)).   Medications:  Infusions:  . heparin Stopped (12/06/16 0900)    Assessment: 76 year old male with likely new cancer and acute PE. He was started on Lovenox on 3/6 and transitioned to Heparin on 3/8 in anticipation of his thoracentesis and biopsy that were performed today. Discussed with Dr. Laurence Ferrari - ok to restart heparin now. Note his hemoglobin and platelets have continued to drop over the past few days. The timing of his platelet drop isn't classically consistent with heparin induced thrombocytopenia but will need to be monitored closely. Note he has some renal insufficiency that likely resulted in accumulation of Lovenox and contributed to his supratherapeutic heparin level obtained earlier today. I expect his heparin levels will continue to decline as Lovenox clears. He remains on Day #4 of cefepime for pneumonia. His dose is appropriate for his renal function.  Goal of Therapy:   Heparin level 0.3-0.7 units/ml Monitor platelets by anticoagulation protocol: Yes   Plan:  Restart Heparin infusion at 1000 units/hr Check heparin level in 8 hours Daily heparin level and CBC - monitor platelets closely Continue Cefepime 2g IV q24h  Legrand Como, Pharm.D., BCPS, AAHIVP Clinical Pharmacist Phone: [phone] or [phone] Pager: [phone] 12/06/2016, 3:45 PM

## 2016-12-06 NOTE — Procedures (Signed)
Interventional Radiology Procedure Note  Procedure:  1.) Left thoracentesis with CT guidance --> 800 mL pleural fluid aspirated.  Samples sent for labs.  2.) CT guided core biopsy of lytic left iliac crest lesion  Complications: None  Estimated Blood Loss: None  Recommendations: - Return to room - Path and fluid labs pending  Signed,  Criselda Peaches, MD

## 2016-12-06 NOTE — Sedation Documentation (Signed)
Patient was brought to IR on 15L non rebreather mask. Patient saturation 100%. Patient asleep. Arousable to stimuli. Patient switched to a simple mask for procedure.

## 2016-12-06 NOTE — Progress Notes (Signed)
ANTICOAGULATION CONSULT NOTE - Follow Up Consult  Pharmacy Consult for heparin Indication: pulmonary embolus and DVT  Labs:  Recent Labs  12/14/2016 1123 12/04/16 0621 12/04/16 1513 12/05/16 0851 12/06/16 0300 12/06/16 0307  HGB 12.8* 10.3*  --  8.8*  --  8.1*  HCT 39.9 32.6*  --  27.2*  --  25.6*  PLT 330 319  --  286  --  139*  APTT  --   --  44*  --  133*  --   LABPROT  --   --  16.2*  --  18.4*  --   INR  --   --  1.29  --  1.52  --   HEPARINUNFRC  --   --   --   --  1.66*  --   CREATININE 1.48* 1.58*  --  1.57*  --   --     Assessment: 76yo male above goal on heparin with initial dosing for PE/DVT; lab drawn correctly.  Goal of Therapy:  Heparin level 0.3-0.7 units/ml   Plan:  Will hold heparin gtt x29mn then resume at lower rate of 1000 units/hr and check level in [place] PharmD, BCPS  12/06/2016,5:29 AM

## 2016-12-06 NOTE — Progress Notes (Signed)
Hypoglycemic Event  CBG: 63  Treatment: 25 ml dextrose 50%  Symptoms: none  Follow-up CBG: Time: 0115 CBG Result: 95  Possible Reasons for Event: NPO  Comments/MD notified:    Jawad Wiacek, Blondell Reveal

## 2016-12-06 NOTE — Consult Note (Signed)
   [facility] CM Inpatient Consult   12/06/2016  [place] 1941/07/04 [id number]  Received a message of the patient's admission to the hospital from the [place] in which the patient was active with [facility].  Patient is on the [employer] [place] Registry.  Chart reviewed and reveals the patient is a 76 y.o.malewith history of CAD s/p CABG, HTN, and GERD who was recently admitted to the [facility] [address] with Influenza A and completed a course of Tamiflu. He was discharged home on supplemental O2. Since his discharge home patient had been feeling weak and SOB.  Patient was seen by his PCP and a CXR noted pneumonia vs interstitial edema w/ a new opacity in the left upper lobe suspicious for a loculated effusion.  When his sx did not improve his family contacted EMS and he was brought to the ED.  In the ED his oxygen saturation was 89% on RA, but patient had increased work of breathing and was agitated with increasing shortness of breath.  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  Will follow for progress and disposition needs as appropriate for community.  For questions, please contact:  Natividad Brood, RN BSN [facility] Liaison  [phone] business mobile phone Toll free office [phone]

## 2016-12-07 ENCOUNTER — Inpatient Hospital Stay (HOSPITAL_COMMUNITY): Payer: PPO

## 2016-12-07 LAB — COMPREHENSIVE METABOLIC PANEL
ALBUMIN: 2.4 g/dL — AB (ref 3.5–5.0)
ALK PHOS: 56 U/L (ref 38–126)
ALT: 60 U/L (ref 17–63)
ANION GAP: 12 (ref 5–15)
AST: 62 U/L — ABNORMAL HIGH (ref 15–41)
BUN: 73 mg/dL — ABNORMAL HIGH (ref 6–20)
CALCIUM: 12.5 mg/dL — AB (ref 8.9–10.3)
CO2: 26 mmol/L (ref 22–32)
Chloride: 107 mmol/L (ref 101–111)
Creatinine, Ser: 2.36 mg/dL — ABNORMAL HIGH (ref 0.61–1.24)
GFR calc Af Amer: 30 mL/min — ABNORMAL LOW (ref >60)
GFR calc non Af Amer: 26 mL/min — ABNORMAL LOW (ref >60)
GLUCOSE: 85 mg/dL (ref 65–99)
Potassium: 5.2 mmol/L — ABNORMAL HIGH (ref 3.5–5.1)
SODIUM: 145 mmol/L (ref 135–145)
Total Bilirubin: 0.7 mg/dL (ref 0.3–1.2)
Total Protein: 4.9 g/dL — ABNORMAL LOW (ref 6.5–8.1)

## 2016-12-07 LAB — GLUCOSE, CAPILLARY
GLUCOSE-CAPILLARY: 103 mg/dL — AB (ref 65–99)
Glucose-Capillary: 98 mg/dL (ref 65–99)
Glucose-Capillary: 113 mg/dL — ABNORMAL HIGH (ref 65–99)
Glucose-Capillary: 96 mg/dL (ref 65–99)

## 2016-12-07 LAB — TRIGLYCERIDES, BODY FLUIDS: TRIGLYCERIDES FL: 23 mg/dL

## 2016-12-07 LAB — PROCALCITONIN: PROCALCITONIN: 0.3 ng/mL

## 2016-12-07 LAB — CBC
HEMATOCRIT: 18 % — AB (ref 39.0–52.0)
Hemoglobin: 5.8 g/dL — CL (ref 13.0–17.0)
MCH: 31.2 pg (ref 26.0–34.0)
MCHC: 32.2 g/dL (ref 30.0–36.0)
MCV: 96.8 fL (ref 78.0–100.0)
Platelets: 218 10*3/uL (ref 150–400)
RBC: 1.86 MIL/uL — AB (ref 4.22–5.81)
RDW: 13.8 % (ref 11.5–15.5)
WBC: 23.5 10*3/uL — AB (ref 4.0–10.5)

## 2016-12-07 LAB — GRAM STAIN: Gram Stain: NONE SEEN

## 2016-12-07 LAB — PSA, TOTAL AND FREE
PSA FREE: 0.07 ng/mL
PSA, Free Pct: 10 %
Prostate Specific Ag, Serum: 0.7 ng/mL (ref 0.0–4.0)

## 2016-12-07 LAB — HEPARIN LEVEL (UNFRACTIONATED)
Heparin Unfractionated: 0.88 IU/mL — ABNORMAL HIGH (ref 0.30–0.70)
Heparin Unfractionated: 0.6 IU/mL (ref 0.30–0.70)

## 2016-12-07 LAB — MAGNESIUM: Magnesium: 1.9 mg/dL (ref 1.7–2.4)

## 2016-12-07 MED ORDER — GADOBENATE DIMEGLUMINE 529 MG/ML IV SOLN
7.5000 mL | Freq: Once | INTRAVENOUS | Status: AC | PRN
  Administered 2016-12-07: 7.5 mL via INTRAVENOUS

## 2016-12-07 MED ORDER — ALBUMIN HUMAN 25 % IV SOLN
12.5000 g | Freq: Four times a day (QID) | INTRAVENOUS | Status: DC
  Administered 2016-12-07: 12.5 g via INTRAVENOUS
  Filled 2016-12-07: qty 50

## 2016-12-07 MED ORDER — DEXTROSE-NACL 5-0.45 % IV SOLN: INTRAVENOUS | Status: DC

## 2016-12-07 MED ORDER — LORAZEPAM 2 MG/ML IJ SOLN
0.5000 mg | INTRAMUSCULAR | Status: DC | PRN
  Administered 2016-12-07: 0.5 mg via INTRAVENOUS
  Filled 2016-12-07: qty 1

## 2016-12-07 MED ORDER — DEXTROSE 5 % IV SOLN: 1.0000 g | INTRAVENOUS | Status: DC

## 2016-12-07 MED FILL — Medication: Qty: 1 | Status: AC

## 2016-12-08 LAB — CULTURE, BLOOD (ROUTINE X 2)
Culture: NO GROWTH
Culture: NO GROWTH

## 2016-12-08 LAB — PH, BODY FLUID: pH, Body Fluid: 8

## 2016-12-09 LAB — KAPPA/LAMBDA LIGHT CHAINS
KAPPA FREE LGHT CHN: 29.3 mg/L — AB (ref 3.3–19.4)
Kappa, lambda light chain ratio: 1.37 (ref 0.26–1.65)
Lambda free light chains: 21.4 mg/L (ref 5.7–26.3)

## 2016-12-10 ENCOUNTER — Other Ambulatory Visit: Payer: Self-pay

## 2016-12-10 LAB — MULTIPLE MYELOMA PANEL, SERUM
ALBUMIN/GLOB SERPL: 1.1 (ref 0.7–1.7)
ALPHA 1: 0.3 g/dL (ref 0.0–0.4)
ALPHA2 GLOB SERPL ELPH-MCNC: 0.8 g/dL (ref 0.4–1.0)
Albumin SerPl Elph-Mcnc: 2.2 g/dL — ABNORMAL LOW (ref 2.9–4.4)
B-GLOBULIN SERPL ELPH-MCNC: 0.7 g/dL (ref 0.7–1.3)
GAMMA GLOB SERPL ELPH-MCNC: 0.4 g/dL (ref 0.4–1.8)
Globulin, Total: 2.2 g/dL (ref 2.2–3.9)
IGG (IMMUNOGLOBIN G), SERUM: 441 mg/dL — AB (ref 700–1600)
IGM, SERUM: 53 mg/dL (ref 15–143)
IgA: 211 mg/dL (ref 61–437)
Total Protein ELP: 4.4 g/dL — ABNORMAL LOW (ref 6.0–8.5)

## 2016-12-10 NOTE — Patient Outreach (Signed)
[facility] Care Management  12/10/2016  [place] June 18, 1941 [id number]  Per hospital liaison update, patient expired.  PLAN:  RNCM will refer patient to care management assistant to close. RNCM will notify patients primary MD of closure due to patient being deceased.   Quinn Plowman RN,BSN,CCM [facility] Telephonic  [phone]

## 2016-12-11 LAB — CULTURE, BODY FLUID-BOTTLE: CULTURE: NO GROWTH

## 2016-12-12 LAB — BLOOD GAS, ARTERIAL
Acid-Base Excess: 7.9 mmol/L — ABNORMAL HIGH (ref 0.0–2.0)
BICARBONATE: 33 mmol/L — AB (ref 20.0–28.0)
DELIVERY SYSTEMS: POSITIVE
DRAWN BY: 39898
Expiratory PAP: 7
FIO2: 40
Inspiratory PAP: 14
O2 SAT: 98.2 %
PCO2 ART: 56.9 mmHg — AB (ref 32.0–48.0)
Patient temperature: 98.6
RATE: 12 resp/min
pH, Arterial: 7.381 (ref 7.350–7.450)
pO2, Arterial: 127 mmHg — ABNORMAL HIGH (ref 83.0–108.0)

## 2016-12-20 ENCOUNTER — Ambulatory Visit: Payer: PPO | Admitting: Cardiovascular Disease

## 2016-12-29 NOTE — Progress Notes (Signed)
As I arrived at bedside patient was being coded per ACLS protocol. Code had just started about 2 minutes prior to my arrival and chest compressions were underway. It was thought patient wasn't asystole rhythm therefore protocol was carried out accordingly. Family was present outside the room. Patient was given several rounds of epinephrine, 1 amp of bicarbonate and 1 amp of D50. Patient still remained DO NOT INTUBATE. Code team arrived who started helping with code as a step outside to speak with the family. Family stated they did not want any aggressive measures besides medications per ACLS. Patient had a brief episode of ROS see lasting for about maybe 3 minutes and SVT. During this time it was noted he was having low blood in his mouth and heparin was turned off prior to that. At the moment family was asked to step in the room to be with the patient but soon after patient lost his pulse again and at that time family decided to keep him comfortable and no further CODE BLUE to be carried out. Soon after patient lost his pulse and stop breathing. He was evaluated by internal medicine resident and time of death was called at 1:18 PM. I offered family and assistance if necessary and also chaplain services. Family is except that this as well as they could given their medical background but still very tearful as expected. I have filled out death certificate and will be placed in the chart. Nursing staff notified, appreciate their assistance. Please call with any further questions needed. For details of this Code blue please refer to the chart.

## 2016-12-29 NOTE — Progress Notes (Signed)
ANTICOAGULATION CONSULT NOTE - Follow Up Consult  Pharmacy Consult for Heparin Indication: pulmonary embolus   Allergies  Allergen Reactions  . Codeine Nausea Only    severe  . Tramadol Nausea Only    severe    Patient Measurements: Height: '5\' 7"'$  (170.2 cm) Weight: 167 lb (75.8 kg) IBW/kg (Calculated) : 66.1  Vital Signs: Temp: 98.1 F (36.7 C) (03/10 0800) Temp Source: Axillary (03/10 0800) BP: 121/60 (03/10 0900) Pulse Rate: 131 (03/10 0900)  Labs:  Recent Labs  12/04/16 1513 12/05/16 0851 12/06/16 0300 12/06/16 0307 12/06/16 0813 12/06/16 2321 12-24-16 0900  HGB  --  8.8*  --  8.1*  --   --   --   HCT  --  27.2*  --  25.6*  --   --   --   PLT  --  286  --  139*  --   --   --   APTT 44*  --  133*  --   --   --   --   LABPROT 16.2*  --  18.4*  --   --   --   --   INR 1.29  --  1.52  --   --   --   --   HEPARINUNFRC  --   --  1.66*  --   --  0.88* 0.60  CREATININE  --  1.57*  --   --  1.63*  --   --     Estimated Creatinine Clearance: 37.2 mL/min (by C-G formula based on SCr of 1.63 mg/dL (H)).   Medications:  Infusions:  . 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 He was started on Lovenox on 3/6 and transitioned to Heparin on 3/8 for thoracentesis and biopsy that were performed 3/9. Heparin initially subtherapeutic but now is therapeutic at 0.60. Hemoglobin and platelets have dropped over past few days, but timing of platelet fall not consistent with HIT - will need to be watched closely. No S/Sx bleeding per RN today.  Goal of Therapy:  Heparin level 0.3-0.7 units/ml Monitor platelets by anticoagulation protocol: Yes   Plan:  -Continue heparin infusion to 850 units/hr -Check confirmatory heparin level in 8 hours -Daily heparin level and CBC - monitor platelets closely  Arrie Senate, PharmD PGY-1 Pharmacy Resident Pager: [phone] 12-24-16

## 2016-12-29 NOTE — Discharge Summary (Signed)
Physician Discharge Summary  Curtis Galloway TFT:[id number] DOB: [birth year] DOA: 12/16/2016  PCP: Binnie Rail, MD  Admit date: 12/04/2016 Discharge date: 12/10/2016  Admitted From: Home Disposition:  Deceased  Recommendations for Outpatient Follow-up:  1. Deceased   Brief/Interim Summary: 76 year old male with a past medical history of coronary artery disease status post CABG, hypertension and history of renal cell cancer status post nephrectomy years ago presented to the ED with complaints of weakness and shortness of breath. Due to his condition and severe shortness of breath with hypoxia CTA of the chest was done which showed patient had pulmonary embolism along with concerns of pneumonia, hilar adenopathy concerns for metastatic malignancy and pleural effusion. He was started on IV heparin, broad-spectrum antibiotics and oncology was consulted CT of the abdomen pelvis was done which showed no acute processes but it did show bone metastases in the lumbar and iliac region. It was determined that we should get a biopsy of the bone lesions to see what's the primary cause of malignancy. Case was discussed with interventional radiology and bone biopsy of left iliac crest was performed. Due to shortness of breath which  Likely due to effusion left-sided thoracentesis was performed as well and about 800 mL of fluid was removed. Oncology recommended getting an MRI of the brain some encephalopathy and agitation overeater but he was unable to tolerate MRI at first but later MRI revealed no intracranial lesions but it did show bone lesions. During the time of admission his calcium was elevated, creatinine was elevated, anemia and bone lesions therefore multiple myeloma panel was sent as well but it was not back at the time of his death. Over the course of 2 or 3 days he continued to stay agitated which was likely from pain as well therefore was controlled with IV pain medication to make them feel comfortable. No  signs of active bleeding was noted during this time. Finally what it looked like he might have became acidotic and went into asystole. CODE BLUE was called and ACLS protocol was carried out. Unfortunately after resuscitating measure his pulse only came back for about 2 minutes and then finally family decided to make him comfort care given likely poor prognosis. Patient passed away on 01-06-17 at 7:06 PM. Death certificate was filled out.   Family requested me to follow-up with them once the biopsy results come back. I spoke with Margreta Journey, patient's daughter, at family's request and I updated her on biopsy results. Discharge Diagnoses:  Principal Problem:   Acute respiratory failure with hypoxia [facility]) Active Problems:   Essential hypertension, benign   Coronary atherosclerosis of native coronary artery, 11/15/13 PTCA/DES prox LAD, [facility]   GERD   Diabetes mellitus with peripheral vascular disease (HCC)   Chronic back pain   Pulmonary embolism (HCC)   Lung cancer ([place])   Healthcare-associated pneumonia   Metastasis ([place])   Malnutrition of moderate degree    Discharge Instructions   Allergies as of 06-Jan-2017      Reactions   Codeine Nausea Only   severe   Tramadol Nausea Only   severe      Medication List    ASK your doctor about these medications   aspirin 325 MG tablet Take 325 mg by mouth daily.   atorvastatin 80 MG tablet Commonly known as:  LIPITOR Take 1 tablet (80 mg total) by mouth at bedtime.   FISH OIL PO Take 1 capsule by mouth daily. Mega Red brand   metoprolol succinate 50 MG  24 hr tablet Commonly known as:  TOPROL-XL Take 1 tablet (50 mg total) by mouth daily. Take with Metoprolol 25 mg tablet   multivitamin tablet Take 1 tablet by mouth daily.   nitroGLYCERIN 0.4 MG SL tablet Commonly known as:  NITROSTAT Place 1 tablet (0.4 mg total) under the tongue every 5 (five) minutes as needed for chest pain.       Allergies  Allergen Reactions  .  Codeine Nausea Only    severe  . Tramadol Nausea Only    severe    Consultations: None  Procedures/Studies: Dg Chest 2 View  Result Date: 12/21/2016 CLINICAL DATA:  Congestion, cough, history of recent pneumonia, former smoking history EXAM: CHEST  2 VIEW COMPARISON:  Chest x-ray of 11/22/2016 FINDINGS: Prominent coarse interstitial markings again are noted bilaterally left much greater than right with bilateral pleural effusions. There is now and opacity within the left lung apex which has developed since the chest x-ray 11/22/2016, therefore most consistent with loculated effusion. CT the chest is recommended to evaluate in more detail with IV contrast media. Mediastinal and hilar contours are unchanged and the heart is within normal limits in size. Median sternotomy sutures are noted from prior CABG. 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 IMPRESSION: 1. Worsening of coarse interstitial lung markings left-greater-than-right. Possible pneumonia as viral pneumonia versus interstitial edema with effusions. 2. New opacity in the left lung apex most consistent with loculated effusion in view of the rapid development. Recommend CT of the chest with IV contrast media. 3. Changes of asbestos related disease. Electronically Signed   By: Ivar Drape M.D.   On: 11/30/2016 08:24   Dg Chest 2 View  Result Date: 11/22/2016 CLINICAL DATA:  Shortness of breath.  Chest pain . EXAM: CHEST  2 VIEW COMPARISON:  08/28/2015 FINDINGS: Prior CABG. Heart size normal. No pulmonary venous congestion. Bilateral pulmonary interstitial prominence noted, particularly prominent on the left. Pneumonitis could present in this fashion. Interstitial edema could present in this fashion. 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 Similar findings noted on prior exam. No pneumothorax. No acute bony abnormality. IMPRESSION: 1. Bilateral pulmonary interstitial prominence,  left side greater right. Bilateral pneumonitis and/or interstitial edema could present in this fashion. 2. Bilateral pleural thickening with calcified pleural plaques again noted. 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 3. Prior CABG.  Heart size stable. Electronically Signed   By: Marcello Moores  Register   On: 11/22/2016 10:41   Dg Cervical Spine Complete  Result Date: 11/18/2016 CLINICAL DATA:  Ongoing neck pain for several weeks. No reported trauma. EXAM: CERVICAL SPINE - COMPLETE 4+ VIEW COMPARISON:  None. FINDINGS: No traumatic malalignment. There is straightening of normal lordosis. The pre odontoid space and prevertebral soft tissues are normal. Multilevel degenerative changes are seen with anterior osteophytes and loss of disc space particularly at C4-5 and C6-7. The neural foramina are patent. The lateral masses of C1 align with C2. The odontoid process is unremarkable. Chronic calcifications are seen in the neck. IMPRESSION: 1. Moderate degenerative changes in the cervical spine. 2. Chronic calcifications in the neck. 3. No other acute abnormalities. Electronically Signed   By: Dorise Bullion III M.D   On: 11/18/2016 16:12   Ct Chest W Contrast  Result Date: 12/04/2016 CLINICAL DATA:  Productive cough, worsening short of breath EXAM: CT CHEST WITH CONTRAST TECHNIQUE: Multidetector CT imaging of the chest was performed during intravenous contrast administration. CONTRAST:  48m ISOVUE-300 IOPAMIDOL (ISOVUE-300) INJECTION 61%  COMPARISON:  CT 04/11/2016 FINDINGS: Cardiovascular: Post CABG.  No pericardial fluid. Within the RIGHT lower lobe pulmonary artery there is a thin tubular branching filling defect (image 74 through 79 series 201). There is extensive hilar adenopathy at this same level with external compression of the pulmonary artery (image 75, series 601) Mediastinum/Nodes: No axillary or supraclavicular adenopathy. Subcarinal lymph node measures 22 mm. RIGHT lower paratracheal node  measures 12 mm. RIGHT hilar nodal complex measures 3.2 cm. RIGHT infrahilar nodal complex measures 2.7 cm (image 35 series 6). The mild perihilar thickening on the LEFT Lungs/Pleura: There are bilateral small effusions. Effusions extend along the oblique fissures and partially loculated fluid at the LEFT lung apex. There is interstitial thickening extending from the hilum within the LEFT upper lobe and LEFT lower lobe. Within the RIGHT lung scattered interstitial thickening and peripheral nodularity. No discrete measurable nodule. Calcification over the RIGHT diaphragm. Upper Abdomen: Limited view of the liver, kidneys, pancreas are unremarkable. Normal adrenal glands. Musculoskeletal: Multiple rounded lucent lesions within the spine. For example multiple 5 to 7 mm lesions on image 132, series 601 in the lower thoracic spines. Similar lesions in the upper thoracic spine (image 30, series 201) measuring 9 mm. Lytic lesion in the LEFT aspect of the manubrium. IMPRESSION: 1. Diffuse interstitial thickening in the LEFT lung with loculated fluid at the LEFT lung apex. 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 2. RIGHT hilar adenopathy and infrahilar nodal thickening as well as a large mediastinal nodes is concerning for metastatic adenopathy. 3. Small filling defect within the RIGHT lower lobe pulmonary nodule consistent with PULMONARY EMBOLISM. This etiology of embolism may be local compression of the artery by the RIGHT infrahilar adenopathy. Cannot exclude peripheral deep venous thrombosis. 4. 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 5. Patient has a history of LEFT nephrectomy. Differential for malignancy would include bronchogenic carcinoma versus metastatic renal cell carcinoma. Findings conveyed toLiu, MDon 12/01/2016  at14:48. Electronically Signed   By: Suzy Bouchard M.D.   On: 12/25/2016 14:52   Ct Guided Needle Placement  Result Date: 12/06/2016 INDICATION:  76 year old male with active influenza a and multifocal lytic osseous lesions as well as a large partially loculated left-sided pleural effusion. He presents for both left thoracentesis as well as CT-guided biopsy of a lytic lesion of the left iliac crest. EXAM: CT-GUIDED BIOPSY CT-GUIDED ASPIRATION MEDICATIONS: None. ANESTHESIA/SEDATION: Moderate (conscious) sedation was employed during this procedure. A total of Versed 1 mg and Fentanyl 25 mcg was administered intravenously. Moderate Sedation Time: 20 minutes. The patient's level of consciousness and vital signs were monitored continuously by radiology nursing throughout the procedure under my direct supervision. FLUOROSCOPY TIME:  Fluoroscopy Time: 0 minutes 0 seconds (0 mGy). COMPLICATIONS: None immediate. PROCEDURE: Informed written consent was obtained from the patient after a thorough discussion of the procedural risks, benefits and alternatives. All questions were addressed. A timeout was performed prior to the initiation of the procedure. The patient was placed in a right lateral decubitus position in a planning axial CT scan was performed. Suitable skin entry sites were selected for both the left pleural effusion and the left iliac crest lesion. The regions were sterilely prepped and draped in standard fashion using chlorhexidine skin prep. Attention was first turned to the left pleural effusion. Local anesthesia was attained by infiltration with 1% lidocaine. A 6 French Safe-T-Centesis catheter was then advanced through the skin surface and into the pleural fluid. The catheter was advanced off the stylet  in into the pleural space. Thoracentesis was then performed yielding approximately 870 mL of amber colored pleural fluid. While the fluid was draining, attention was turned to the left iliac lesion. Local anesthesia was again attained by infiltration with 1% lidocaine. A small dermatotomy was made. Under intermittent CT guidance, a 17 gauge trocar needle  was carefully advanced through the cortex and into the lesion. Multiple 18 gauge core biopsies were obtained. Biopsy specimens were placed in both formalin and saline and delivered to pathology for further analysis. The needle was removed. A repeat CT scan was performed confirming no evidence of hemorrhage or other complication. The left pleural effusion has been completely drained. A sample of the pleural fluid was also sent to the lab. IMPRESSION: 1. Successful left-sided thoracentesis yielding 870 mL amber colored pleural fluid which was sent for cytology and labs as requested. 2. Successful CT-guided biopsy of the left iliac crest lesion. Signed, Criselda Peaches, MD [facility] Electronically Signed   By: Jacqulynn Cadet M.D.   On: 12/06/2016 17:29   Mr Jeri Cos FO Contrast  Result Date: 01/06/17 CLINICAL DATA:  Initial evaluation for newly diagnosed metastatic lung cancer. EXAM: MRI HEAD WITHOUT AND WITH CONTRAST TECHNIQUE: Multiplanar, multiecho pulse sequences of the brain and surrounding structures were obtained without and with intravenous contrast. CONTRAST:  7.48m MULTIHANCE GADOBENATE DIMEGLUMINE 529 MG/ML IV SOLN COMPARISON:  None. FINDINGS: Brain: Study significantly degraded by motion artifact. Diffuse prominence of the CSF containing spaces compatible with generalized age-related cerebral atrophy. No significant cerebral white matter disease for patient age. No abnormal foci of restricted diffusion to suggest acute or subacute ischemia. Gray-white matter differentiation maintained. No encephalomalacia to suggest chronic infarction. Single subcentimeter focus of susceptibility artifact overlies the right parietotemporal convexity, possibly a small microhemorrhage, of doubtful significance. No other evidence for acute or chronic intracranial hemorrhage identified. No mass lesion, midline shift, or mass effect. Ventricles normal in  size without evidence for hydrocephalus. No extra-axial fluid collection. Major dural sinuses are grossly patent. No abnormal enhancement to suggest intracranial metastatic disease. Pituitary gland and suprasellar region within normal limits. Vascular: Major intracranial vascular flow voids are maintained. Skull and upper cervical spine: Craniocervical junction within normal limits. Visualized upper cervical spine unremarkable. There is question of abnormal signal intensity within the left occipital condyles (series 4, image 16). While this could be degenerative in nature, a possible osseous metastasis could be considered. Similarly, there is question of signal abnormality within the clivus as well (series 4, image 13). Signal intensity within the visualized bone marrow otherwise within normal limits. Scalp soft tissues unremarkable. Sinuses/Orbits: Globes and orbital soft tissues within normal limits. Scattered mucosal thickening throughout the paranasal sinuses. No air-fluid level to suggest acute sinusitis. Small right mastoid effusion noted. Inner ear structures normal. Other: No other significant finding. IMPRESSION: 1. Motion degraded study. No evidence for intracranial metastatic disease. 2. Loss of normal marrow signal within the left occipital condyle and clivus, somewhat suspicious for possible osseous metastases. Follow-up examination with dedicated head CT is suggested for further evaluation. 3. No other acute intracranial process identified on this motion degraded exam. Electronically Signed   By: BJeannine BogaM.D.   On: 0April 09, 201804:[id number]   Ct Abdomen Pelvis W Contrast  Result Date: 12/04/2016 CLINICAL DATA:  Renal cell carcinoma status post nephrectomy and history of asbestos exposure. Concern for metastatic renal cancer. EXAM: CT ABDOMEN AND PELVIS WITH CONTRAST TECHNIQUE: Multidetector CT imaging of the abdomen and  pelvis was performed using the standard protocol following bolus  administration of intravenous contrast. CONTRAST:  100 cc Isovue-300 COMPARISON:  12/21/2016 chest CT, CT abdomen and pelvis 04/11/2016 FINDINGS: Lower chest: Bilateral pleural effusions left greater than right as on recent chest CT with calcified and noncalcified pleural plaque in keeping with history of prior asbestos exposure most prominently involving the right diaphragmatic pleura Normal visualized cardiac chambers. No pericardial effusion. Status post median sternotomy. Coronary arteriosclerosis is identified. Changes of paraseptal emphysema. Bilateral lower lobe bronchiectasis and peribronchial thickening. Interstitial thickening is seen within the visualized lower lobes as before. Hepatobiliary: Cholelithiasis without complication. Homogeneous attenuation of liver. No biliary dilatation. Pancreas: Atrophic pancreas.  No ductal dilatation or mass. Spleen: Normal normal Adrenals/Urinary Tract: Compensatory hypertrophy of the right kidney without enhancing mass. Status post left nephrectomy. Renovascular calcifications are noted at the hilum. Nonobstructing calculi are also present in the interpolar aspect versus vascular calcifications. No enhancing mass of the right kidney. Chronic mild thickening of the left adrenal gland. Stomach/Bowel: Contrast distended stomach. Normal small bowel rotation without inflammation or obstruction. Moderate colonic stool burden within large bowel without obstruction or inflammation. Appendix is not visualized but no pericecal inflammation is noted. Vascular/Lymphatic: Aortoiliac atherosclerosis without aneurysm. Calcified mesenteric lymph node in left upper quadrant. Reproductive: Normal size prostate. Other: No abdominal wall hernia or abnormality. No abdominopelvic ascites. Musculoskeletal: Posterior fusion hardware from L3 through L5 with spinal decompression and interbody blocks noted. Osteolytic metastatic foci along the visualized lower thoracic and lumbar spine from T10  caudad to S1. Osteolytic lesions of the iliac bones bilaterally, left parasymphysis, right inferior pubic ramus, left ischium IMPRESSION: 1. 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 Pre-existing posterior lumbar decompression and fusion from L3 through L5. 2. Status post left nephrectomy. Compensatory hypertrophy of the right kidney without focal mass or obstruction. 3. Uncomplicated cholelithiasis. 4. Nonobstructing right-sided nephrolithiasis and renovascular calcifications. 5. Small left greater than right pleural effusions with changes secondary to asbestos exposure. Interstitial prominence at each base cannot exclude lymphangitic spread of malignancy. 6. Chronic thickened appearance of the left adrenal gland. Electronically Signed   By: Ashley Royalty M.D.   On: 12/04/2016 21:04   Ct Aspiration  Result Date: 12/06/2016 INDICATION: 76 year old male with active influenza a and multifocal lytic osseous lesions as well as a large partially loculated left-sided pleural effusion. He presents for both left thoracentesis as well as CT-guided biopsy of a lytic lesion of the left iliac crest. EXAM: CT-GUIDED BIOPSY CT-GUIDED ASPIRATION MEDICATIONS: None. ANESTHESIA/SEDATION: Moderate (conscious) sedation was employed during this procedure. A total of Versed 1 mg and Fentanyl 25 mcg was administered intravenously. Moderate Sedation Time: 20 minutes. The patient's level of consciousness and vital signs were monitored continuously by radiology nursing throughout the procedure under my direct supervision. FLUOROSCOPY TIME:  Fluoroscopy Time: 0 minutes 0 seconds (0 mGy). COMPLICATIONS: None immediate. PROCEDURE: Informed written consent was obtained from the patient after a thorough discussion of the procedural risks, benefits and alternatives. All questions were addressed. A timeout was performed prior to the initiation of the procedure. The patient was placed in a right lateral  decubitus position in a planning axial CT scan was performed. Suitable skin entry sites were selected for both the left pleural effusion and the left iliac crest lesion. The regions were sterilely prepped and draped in standard fashion using chlorhexidine skin prep. Attention was first turned to the left pleural effusion. Local anesthesia was attained by infiltration with 1% lidocaine.  A 6 French Safe-T-Centesis catheter was then advanced through the skin surface and into the pleural fluid. The catheter was advanced off the stylet in into the pleural space. Thoracentesis was then performed yielding approximately 870 mL of amber colored pleural fluid. While the fluid was draining, attention was turned to the left iliac lesion. Local anesthesia was again attained by infiltration with 1% lidocaine. A small dermatotomy was made. Under intermittent CT guidance, a 17 gauge trocar needle was carefully advanced through the cortex and into the lesion. Multiple 18 gauge core biopsies were obtained. Biopsy specimens were placed in both formalin and saline and delivered to pathology for further analysis. The needle was removed. A repeat CT scan was performed confirming no evidence of hemorrhage or other complication. The left pleural effusion has been completely drained. A sample of the pleural fluid was also sent to the lab. IMPRESSION: 1. Successful left-sided thoracentesis yielding 870 mL amber colored pleural fluid which was sent for cytology and labs as requested. 2. Successful CT-guided biopsy of the left iliac crest lesion. Signed, Criselda Peaches, MD [facility]iology Electronically Signed   By: Jacqulynn Cadet M.D.   On: 12/06/2016 17:29       Subjective:   Discharge Exam: Vitals:   12/30/16 0800 December 30, 2016 0900  BP: 108/64 121/60  Pulse: (!) 128 (!) 131  Resp: (!) 21 (!) 23  Temp: 98.1 F (36.7 C)    Vitals:   Dec 30, 2016 0900 Dec 30, 2016 1150  12/30/2016 1313 12-30-2016 1320  BP: 121/60     Pulse: (!) 131     Resp: (!) 23     Temp:      TempSrc:      SpO2:  100%    Weight:    75.8 kg (167 lb 1.7 oz)  Height:   5' 9"  (1.753 m)     General patient is unresponsive to physical and verbal stimuli. No gag reflex, corneal reflex and pupillary reflex was present. No breath sounds and heart sounds were heard.   The results of significant diagnostics from this hospitalization (including imaging, microbiology, ancillary and laboratory) are listed below for reference.     Microbiology: Recent Results (from the past 240 hour(s))  Blood Culture (routine x 2)     Status: None   Collection Time: 12/22/2016 11:20 AM  Result Value Ref Range Status   Specimen Description BLOOD RIGHT FOREARM  Final   Special Requests IN PEDIATRIC BOTTLE 3CC  Final   Culture NO GROWTH 5 DAYS  Final   Report Status 12/08/2016 FINAL  Final  Blood Culture (routine x 2)     Status: None   Collection Time: 12/05/2016 11:27 AM  Result Value Ref Range Status   Specimen Description BLOOD LEFT ANTECUBITAL  Final   Special Requests BOTTLES DRAWN AEROBIC ONLY 5CC  Final   Culture NO GROWTH 5 DAYS  Final   Report Status 12/08/2016 FINAL  Final  Respiratory Panel by PCR     Status: None   Collection Time: 12/28/2016  2:06 PM  Result Value Ref Range Status   Adenovirus NOT DETECTED NOT DETECTED Final   Coronavirus 229E NOT DETECTED NOT DETECTED Final   Coronavirus HKU1 NOT DETECTED NOT DETECTED Final   Coronavirus NL63 NOT DETECTED NOT DETECTED Final   Coronavirus OC43 NOT DETECTED NOT DETECTED Final   Metapneumovirus NOT DETECTED NOT DETECTED Final   Rhinovirus / Enterovirus NOT DETECTED NOT DETECTED Final   Influenza A NOT DETECTED NOT  DETECTED Final   Influenza B NOT DETECTED NOT DETECTED Final   Parainfluenza Virus 1 NOT DETECTED NOT DETECTED Final   Parainfluenza Virus 2 NOT DETECTED NOT DETECTED Final   Parainfluenza Virus 3 NOT DETECTED NOT DETECTED Final    Parainfluenza Virus 4 NOT DETECTED NOT DETECTED Final   Respiratory Syncytial Virus NOT DETECTED NOT DETECTED Final   Bordetella pertussis NOT DETECTED NOT DETECTED Final   Chlamydophila pneumoniae NOT DETECTED NOT DETECTED Final   Mycoplasma pneumoniae NOT DETECTED NOT DETECTED Final  MRSA PCR Screening     Status: None   Collection Time: 12/22/2016  7:17 PM  Result Value Ref Range Status   MRSA by PCR NEGATIVE NEGATIVE Final    Comment:        The GeneXpert MRSA Assay (FDA approved for NASAL specimens only), is one component of a comprehensive MRSA colonization surveillance program. It is not intended to diagnose MRSA infection nor to guide or monitor treatment for MRSA infections.   Culture, body fluid-bottle     Status: None (Preliminary result)   Collection Time: 12/06/16  2:27 PM  Result Value Ref Range Status   Specimen Description FLUID LEFT PLEURAL  Final   Special Requests BOTTLES DRAWN AEROBIC AND ANAEROBIC 10CC  Final   Culture NO GROWTH 3 DAYS  Final   Report Status PENDING  Incomplete  Gram stain     Status: None   Collection Time: 12/06/16  2:27 PM  Result Value Ref Range Status   Specimen Description FLUID LEFT PLEURAL  Final   Special Requests NONE  Final   Gram Stain NO WBC SEEN NO ORGANISMS SEEN   Final   Report Status 12-17-16 FINAL  Final     Labs: BNP (last 3 results)  Recent Labs  11/22/16 1010  BNP 947.0*   Basic Metabolic Panel:  Recent Labs Lab 12/04/16 0621 12/05/16 0851 12/06/16 0813 12/17/2016 0916  NA 141 141 142 145  K 4.3 4.2 4.4 5.2*  CL 103 103 103 107  CO2 30 28 32 26  GLUCOSE 115* 76 85 85  BUN 35* 47* 45* 73*  CREATININE 1.58* 1.57* 1.63* 2.36*  CALCIUM 12.3* 11.6* 12.1* 12.5*  MG  --   --   --  1.9   Liver Function Tests:  Recent Labs Lab 12/05/16 0851 December 17, 2016 0916  AST 29 62*  ALT 29 60  ALKPHOS 60 56  BILITOT 0.7 0.7  PROT 4.7* 4.9*  ALBUMIN 2.0* 2.4*   No results for input(s): LIPASE, AMYLASE in the  last 168 hours. No results for input(s): AMMONIA in the last 168 hours. CBC:  Recent Labs Lab 12/04/16 0621 12/05/16 0851 12/06/16 0307 12-17-16 1155  WBC 22.6* 18.6* 18.7* 23.5*  NEUTROABS 20.3*  --   --   --   HGB 10.3* 8.8* 8.1* 5.8*  HCT 32.6* 27.2* 25.6* 18.0*  MCV 95.0 96.1 96.2 96.8  PLT 319 286 139* 218   Cardiac Enzymes: No results for input(s): CKTOTAL, CKMB, CKMBINDEX, TROPONINI in the last 168 hours. BNP: Invalid input(s): POCBNP CBG:  Recent Labs Lab 12/06/16 1936 12-17-2016 0110 12-17-16 0413 12/17/16 0915 2016-12-17 1143  GLUCAP 77 103* 98 113* 96   D-Dimer No results for input(s): DDIMER in the last 72 hours. Hgb A1c No results for input(s): HGBA1C in the last 72 hours. Lipid Profile No results for input(s): CHOL, HDL, LDLCALC, TRIG, CHOLHDL, LDLDIRECT in the last 72 hours. Thyroid function studies No results for input(s): TSH, T4TOTAL, T3FREE, THYROIDAB  in the last 72 hours.  Invalid input(s): FREET3 Anemia work up No results for input(s): VITAMINB12, FOLATE, FERRITIN, TIBC, IRON, RETICCTPCT in the last 72 hours. Urinalysis    Component Value Date/Time   COLORURINE YELLOW 12/19/2016 1940   APPEARANCEUR CLEAR 12/14/2016 1940   LABSPEC 1.030 12/09/2016 1940   PHURINE 5.0 12/12/2016 1940   GLUCOSEU NEGATIVE 12/23/2016 1940   GLUCOSEU NEGATIVE 06/28/2015 1505   HGBUR NEGATIVE 11/30/2016 1940   BILIRUBINUR NEGATIVE 12/09/2016 1940   KETONESUR NEGATIVE 12/24/2016 1940   PROTEINUR NEGATIVE 12/10/2016 1940   UROBILINOGEN 0.2 06/28/2015 1505   NITRITE NEGATIVE 12/06/2016 1940   LEUKOCYTESUR NEGATIVE 12/15/2016 1940   Sepsis Labs Invalid input(s): PROCALCITONIN,  WBC,  LACTICIDVEN Microbiology Recent Results (from the past 240 hour(s))  Blood Culture (routine x 2)     Status: None   Collection Time: 12/02/2016 11:20 AM  Result Value Ref Range Status   Specimen Description BLOOD RIGHT FOREARM  Final   Special Requests IN PEDIATRIC BOTTLE 3CC  Final    Culture NO GROWTH 5 DAYS  Final   Report Status 12/08/2016 FINAL  Final  Blood Culture (routine x 2)     Status: None   Collection Time: 12/24/2016 11:27 AM  Result Value Ref Range Status   Specimen Description BLOOD LEFT ANTECUBITAL  Final   Special Requests BOTTLES DRAWN AEROBIC ONLY 5CC  Final   Culture NO GROWTH 5 DAYS  Final   Report Status 12/08/2016 FINAL  Final  Respiratory Panel by PCR     Status: None   Collection Time: 12/02/2016  2:06 PM  Result Value Ref Range Status   Adenovirus NOT DETECTED NOT DETECTED Final   Coronavirus 229E NOT DETECTED NOT DETECTED Final   Coronavirus HKU1 NOT DETECTED NOT DETECTED Final   Coronavirus NL63 NOT DETECTED NOT DETECTED Final   Coronavirus OC43 NOT DETECTED NOT DETECTED Final   Metapneumovirus NOT DETECTED NOT DETECTED Final   Rhinovirus / Enterovirus NOT DETECTED NOT DETECTED Final   Influenza A NOT DETECTED NOT DETECTED Final   Influenza B NOT DETECTED NOT DETECTED Final   Parainfluenza Virus 1 NOT DETECTED NOT DETECTED Final   Parainfluenza Virus 2 NOT DETECTED NOT DETECTED Final   Parainfluenza Virus 3 NOT DETECTED NOT DETECTED Final   Parainfluenza Virus 4 NOT DETECTED NOT DETECTED Final   Respiratory Syncytial Virus NOT DETECTED NOT DETECTED Final   Bordetella pertussis NOT DETECTED NOT DETECTED Final   Chlamydophila pneumoniae NOT DETECTED NOT DETECTED Final   Mycoplasma pneumoniae NOT DETECTED NOT DETECTED Final  MRSA PCR Screening     Status: None   Collection Time: 11/28/2016  7:17 PM  Result Value Ref Range Status   MRSA by PCR NEGATIVE NEGATIVE Final    Comment:        The GeneXpert MRSA Assay (FDA approved for NASAL specimens only), is one component of a comprehensive MRSA colonization surveillance program. It is not intended to diagnose MRSA infection nor to guide or monitor treatment for MRSA infections.   Culture, body fluid-bottle     Status: None (Preliminary result)   Collection Time: 12/06/16  2:27 PM   Result Value Ref Range Status   Specimen Description FLUID LEFT PLEURAL  Final   Special Requests BOTTLES DRAWN AEROBIC AND ANAEROBIC 10CC  Final   Culture NO GROWTH 3 DAYS  Final   Report Status PENDING  Incomplete  Gram stain     Status: None   Collection Time: 12/06/16  2:27 PM  Result Value Ref Range Status   Specimen Description FLUID LEFT PLEURAL  Final   Special Requests NONE  Final   Gram Stain NO WBC SEEN NO ORGANISMS SEEN   Final   Report Status 01-01-2017 FINAL  Final     Time coordinating discharge: Over 30 minutes  SIGNED:   Damita Lack, MD  [facility] 12/10/2016, 2:51 PM Pager   If 7PM-7AM, please contact night-coverage [email or web address] Password [email or web address]

## 2016-12-29 NOTE — Progress Notes (Signed)
Pharmacy Antibiotic Note  Curtis Galloway is a 76 y.o. male admitted on 12/25/2016 with HCAP.  Pharmacy has been consulted for Cefepime dosing. Renal function acutely worsened overnight, CrCl ~26 ml/min. Pt still has decent UOP, remains afebrile.  Plan: -Reduce cefepime to 1g IV q24h -Monitor renal function closely -Follow-up length of therapy  Height: '5\' 7"'$  (170.2 cm) Weight: 167 lb (75.8 kg) IBW/kg (Calculated) : 66.1  Temp (24hrs), Avg:97.9 F (36.6 C), Min:96.5 F (35.8 C), Max:98.8 F (37.1 C)   Recent Labs Lab 12/02/16 1710 12/02/2016 1123 12/15/2016 1143 12/24/2016 1337 12/02/2016 1617 12/04/16 0621 12/05/16 0851 12/06/16 0307 12/06/16 0813 2017/01/06 0916  WBC 22.2 cH* 20.3*  --   --   --  22.6* 18.6* 18.7*  --   --   CREATININE 1.36 1.48*  --   --   --  1.58* 1.57*  --  1.63* 2.36*  LATICACIDVEN  --   --  3.21* 2.93* 1.87  --   --   --   --   --     Estimated Creatinine Clearance: 25.7 mL/min (by C-G formula based on SCr of 2.36 mg/dL (H)).    Allergies  Allergen Reactions  . Codeine Nausea Only    severe  . Tramadol Nausea Only    severe    Antimicrobials this admission: 3/6 Cefepime >> 3/6 vancomycin >> 3/7  Dose Adjustments: 3/10: SCr increase >> cefepime to 1g IV q24h  Microbiology: 3/6 BCx: neg 3/6 MRSA: negative 3/6 Influenza A positive (also positive on 2/23 and was treated) 3/9 pleural fluid: NGTD  Thank you for allowing pharmacy to be a part of this patient's care.  Arrie Senate, PharmD PGY-1 Pharmacy Resident Pager: [phone] 01-06-2017

## 2016-12-29 NOTE — Progress Notes (Signed)
[facility] TEAM 1 - Stepdown/ICU TEAM  TYREE Galloway  YSA:[id number] DOB: 07-17-1941 DOA: 12/15/2016 PCP: Binnie Rail, MD    Brief Narrative:  76 y.o. male with history of CAD s/p CABG, HTN, and GERD who was recently admitted to the [facility] with Influenza A and completed a course of Tamiflu. He was discharged home on supplemental O2. Since his discharge home patient had been feeling weak and SOB.  Patient was seen by his PCP and a CXR noted pneumonia vs interstitial edema w/ a new opacity in the left upper lobe suspicious for a loculated effusion.  When his sx did not improve his family contacted EMS and he was brought to the ED.  In the ED his oxygen saturation was 89% on RA, but patient had increased work of breathing and was agitated with increasing shortness of breath.  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  Subjective: Patient was able to get MRI last night but there were lots of artifact noted due to movement. He still remains agitated this morning. He is nonverbal and unable to communicate any meaningful complaints. Her thoracentesis yesterday which she tolerated well and had biopsy of his left iliac crest. No signs of infection noted on thoracentesis. He still nothing by mouth therefore his Lopressor switched to IV yesterday.   Assessment & Plan:  Acute hypoxic respiratory failure-multifactorial  Now off BIPAP w/ good sats - PNA v/s Lung CA v/s pneumonitis (recent Flu A) versus pulmonary embolism versus bilateral pleural effusion  - cont empiric abx tx for now  Cultures remain negative at this time Supplemental oxygen Ultrasound-guided thoracentesis ordered  today-diagnostic/therapeutic purpose. It this doesn't improve his heart rate I will give him IV fluids as his creatinine has trended up a little bit. Lasix was given this morning as he had severe rales on physical exam . -Repeat chest x-ray this morning to evaluate if his effusions are came back or any other pathology.  Acute Metabolic encephalopathy/agitation -Unknown exact etiology at this time. As this could be from underlying infection for which she is getting IV antibiotics versus pain versus constipation versus shortness of breath. -We'll order KUB to rule out constipation. He does have nonobstructive renal stone on the CT of the abdomen and pelvis which was done a few days ago. Continue morphine for pain control -Family request Ativan as well for him to calm him down. I will put in a dose for when necessary for now. 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 Pending pathology report from biopsies done yesterday.  Newly diagnosed lung process - CA likely  CT of the chest show patient has acute pulmonary embolism along with hilar infiltrates. CT abdomen pelvis done which showed previous nephrectomy, nonobstructive renal stone, lumbar lytic lesion. -Follow-up biopsy report from his left iliac crest and left-sided thoracentesis. -Appreciate Dr. Ernestina Penna input - will benefit from palliative care consult once we have dx and have determined further care plan.   Sinus Tachycardia: -Could be multifactorial in the setting of pulmonary embolism, pulmonary metastases/pneumonia, dehydration -Currently he is on heparin drip for PE and getting antibiotics. -We'll give him IV fluids D5 half-normal saline and also add albumin  25% 12.5 g every 6 hours for 6 doses given he had low albumin. this will help his tachycardia little bit as his intravascular volume improves. -check BNP -His by mouth metoprolol has been on hold, Lopressor 5 mg IV  every 3 when necessary.   Acute Kidney injury -likely appears to be prerenal -Avoid nephrotoxic drugs. Continue to monitor. -Give him IV fluids today. Monitor closely given his pulmonary status. He is low threshold to be upgraded to the ICU for hemodynamic instability otherwise he is DNI. I don't think he'll tolerate BiPAP well.  Anemia - unknown etiology  -will check heme occult stool for any GI losses. Monitor Hb for now.   Cervical spine pain Pain control. Radiologic imaging-MRI if possible. -Bowel regimen as needed   Constipation -no BM per wife in last 5 days, will check KUB and give bowel regimen.   Pulmonary embolism -Heparin drip    Recent Labs Lab 12/25/2016 1123 12/04/16 0621 12/05/16 0851 12/06/16 0813 12-15-16 0916  CREATININE 1.48* 1.58* 1.57* 1.63* 2.36*    Hx Renal Cell CA s/p L nephrectomy 2004  DM type II A1C April 2017 6.4% - CBG currently well-controlled  CAD s/p CABG in 2015 No anginal complaints - reduced Aspirin to 81 mg given current Lovenox therapy for PE  Hyperlipidemia  Continue Lipitor  DVT prophylaxis:Heparin drip Code Status: DO NOT INTUBATE  Family Communication: Spoke with wife, son,at bedside  Disposition Plan: SDU  Consultants:  Heme onc: Dr Burr Medico  IR  Procedures: None  Antimicrobials:  Maxipime 3/6 > Vancomycin 3/6 >  Objective: Blood pressure 121/60, pulse (!) 131, temperature 98.1 F (36.7 C), temperature source Axillary, resp. rate (!) 23, height '5\' 7"'$  (1.702 m), weight 75.8 kg (167 lb), SpO2 100 %.  Intake/Output Summary (Last 24 hours) at 2016-12-15 1216 Last data filed at 12/15/2016 1100  Gross per 24 hour  Intake           174.31 ml  Output             1200 ml  Net         -1025.69 ml   Filed Weights   12/06/16 0404 12/06/16 1855 12-15-16 0500  Weight: 78.6 kg (173 lb 4.5 oz) 70.9 kg (156 lb 4.9 oz) 75.8 kg (167 lb)    Examination: General: no acute respiratory distress - agitated and confused  Lungs:  mild  - mod diffuse coarse BS - no wheezing  Cardiovascular:  regular - no gallup or rub  Abdomen: Nontender, nondistended, soft, bowel sounds hypoactive.  no rebound, no ascites, no appreciable mass Extremities: No significant cyanosis, clubbing, or edema bilateral lower extremities  CBC:  Recent Labs Lab 12/02/16 1710 12/21/2016 1123 12/04/16 0621 12/05/16 0851 12/06/16 0307  WBC 22.2 cH* 20.3* 22.6* 18.6* 18.7*  NEUTROABS 18.6* 16.7* 20.3*  --   --   HGB 13.0 12.8* 10.3* 8.8* 8.1*  HCT 39.8 39.9 32.6* 27.2* 25.6*  MCV 92.8 95.0 95.0 96.1 96.2  PLT 402.0* 330 319 286 786*   Basic Metabolic Panel:  Recent Labs Lab 12/10/2016 1123 12/04/16 0621 12/05/16 0851 12/06/16 0813 2016-12-15 0916  NA 143 141 141 142 145  K 4.3 4.3 4.2 4.4 5.2*  CL 100* 103 103 103 107  CO2 '29 30 28 '$ 32 26  GLUCOSE 102* 115* 76 85 85  BUN 28* 35* 47* 45* 73*  CREATININE 1.48* 1.58* 1.57* 1.63* 2.36*  CALCIUM 13.4* 12.3* 11.6* 12.1* 12.5*  MG  --   --   --   --  1.9   GFR: Estimated Creatinine Clearance: 25.7 mL/min (by C-G formula based on SCr of 2.36 mg/dL (H)).  Liver Function Tests:  Recent Labs Lab 12/02/16 1710 12/13/2016 1123 12/05/16 0851 12-14-16 0916  AST 18 32 29 62*  ALT '22 24 29 '$ 60  ALKPHOS 91 87 60 56  BILITOT 0.5 0.6 0.7 0.7  PROT 5.9* 6.1* 4.7* 4.9*  ALBUMIN 2.8* 2.3* 2.0* 2.4*    HbA1C: Hgb A1c MFr Bld  Date/Time Value Ref Range Status  01/11/2016 09:48 AM 6.4 4.6 - 6.5 % Final    Comment:    Glycemic Control Guidelines for People with Diabetes:Non Diabetic:  <6%Goal of Therapy: <7%Additional Action Suggested:  >8%   06/30/2015 12:11 PM 6.2 4.6 - 6.5 % Final    Comment:    Glycemic Control Guidelines for People with Diabetes:Non Diabetic:  <6%Goal of Therapy: <7%Additional Action Suggested:  >8%     CBG:  Recent Labs Lab 12/06/16 1936 14-Dec-2016 0110 12-14-2016 0413 12/14/16 0915 12/14/2016 1143  GLUCAP 77 103* 98 113* 96    Recent Results (from the past 240  hour(s))  Blood Culture (routine x 2)     Status: None (Preliminary result)   Collection Time: 12/27/2016 11:20 AM  Result Value Ref Range Status   Specimen Description BLOOD RIGHT FOREARM  Final   Special Requests IN PEDIATRIC BOTTLE 3CC  Final   Culture NO GROWTH 4 DAYS  Final   Report Status PENDING  Incomplete  Blood Culture (routine x 2)     Status: None (Preliminary result)   Collection Time: 12/16/2016 11:27 AM  Result Value Ref Range Status   Specimen Description BLOOD LEFT ANTECUBITAL  Final   Special Requests BOTTLES DRAWN AEROBIC ONLY 5CC  Final   Culture NO GROWTH 4 DAYS  Final   Report Status PENDING  Incomplete  Respiratory Panel by PCR     Status: None   Collection Time: 12/09/2016  2:06 PM  Result Value Ref Range Status   Adenovirus NOT DETECTED NOT DETECTED Final   Coronavirus 229E NOT DETECTED NOT DETECTED Final   Coronavirus HKU1 NOT DETECTED NOT DETECTED Final   Coronavirus NL63 NOT DETECTED NOT DETECTED Final   Coronavirus OC43 NOT DETECTED NOT DETECTED Final   Metapneumovirus NOT DETECTED NOT DETECTED Final   Rhinovirus / Enterovirus NOT DETECTED NOT DETECTED Final   Influenza A NOT DETECTED NOT DETECTED Final   Influenza B NOT DETECTED NOT DETECTED Final   Parainfluenza Virus 1 NOT DETECTED NOT DETECTED Final   Parainfluenza Virus 2 NOT DETECTED NOT DETECTED Final   Parainfluenza Virus 3 NOT DETECTED NOT DETECTED Final   Parainfluenza Virus 4 NOT DETECTED NOT DETECTED Final   Respiratory Syncytial Virus NOT DETECTED NOT DETECTED Final   Bordetella pertussis NOT DETECTED NOT DETECTED Final   Chlamydophila pneumoniae NOT DETECTED NOT DETECTED Final   Mycoplasma pneumoniae NOT DETECTED NOT DETECTED Final  MRSA PCR Screening     Status: None   Collection Time: 11/30/2016  7:17 PM  Result Value Ref Range Status   MRSA by PCR NEGATIVE NEGATIVE Final    Comment:        The GeneXpert MRSA Assay (FDA approved for NASAL specimens only), is one component of  a comprehensive MRSA colonization surveillance program. It is not intended to diagnose MRSA infection nor to guide or monitor treatment for MRSA infections.   Culture, body fluid-bottle     Status: None (Preliminary result)   Collection Time: 12/06/16  2:27 PM  Result Value Ref Range Status   Specimen Description FLUID LEFT PLEURAL  Final   Special Requests BOTTLES DRAWN AEROBIC AND ANAEROBIC 10CC  Final   Culture NO GROWTH < 24 HOURS  Final   Report Status PENDING  Incomplete  Gram stain     Status: None   Collection Time: 12/06/16  2:27 PM  Result Value Ref Range Status   Specimen Description FLUID LEFT PLEURAL  Final   Special Requests NONE  Final   Gram Stain NO WBC SEEN NO ORGANISMS SEEN   Final   Report Status December 18, 2016 FINAL  Final     Scheduled Meds: . albumin human  12.5 g Intravenous Q6H  . aspirin EC  81 mg Oral Daily  . atorvastatin  80 mg Oral QHS  . ceFEPime (MAXIPIME) IV  2 g Intravenous Q24H  . chlorhexidine  15 mL Mouth Rinse BID  . insulin aspart  0-9 Units Subcutaneous TID WC  . ipratropium  0.5 mg Nebulization Q8H  . levalbuterol  0.63 mg Nebulization Q8H  . mouth rinse  15 mL Mouth Rinse q12n4p  . metoprolol succinate  50 mg Oral Daily  . polyethylene glycol  17 g Oral Daily  . sodium chloride flush  3 mL Intravenous Q12H     LOS: 4 days   Gerlean Ren, MD [facility] Office  [phone] Pager - Text Page per Shea Evans as per below:  On-Call/Text Page:      [email or web address]      password [email or web address]  If 7PM-7AM, please contact night-coverage [email or web address] Password [email or web address] 12/18/16, 12:16 PM

## 2016-12-29 NOTE — Progress Notes (Signed)
ANTICOAGULATION CONSULT NOTE - Follow Up Consult  Pharmacy Consult for Heparin Indication: pulmonary embolus   Allergies  Allergen Reactions  . Codeine Nausea Only    severe  . Tramadol Nausea Only    severe    Patient Measurements: Height: '5\' 7"'$  (170.2 cm) Weight: 156 lb 4.9 oz (70.9 kg) IBW/kg (Calculated) : 66.1  Vital Signs: Temp: 98.4 F (36.9 C) (03/09 1946) Temp Source: Oral (03/09 1946) BP: 92/50 (03/09 2100) Pulse Rate: 110 (03/09 2100)  Labs:  Recent Labs  12/04/16 1245 12/04/16 1513 12/05/16 0851 12/06/16 0300 12/06/16 0307 12/06/16 0813 12/06/16 2321  HGB 10.3*  --  8.8*  --  8.1*  --   --   HCT 32.6*  --  27.2*  --  25.6*  --   --   PLT 319  --  286  --  139*  --   --   APTT  --  44*  --  133*  --   --   --   LABPROT  --  16.2*  --  18.4*  --   --   --   INR  --  1.29  --  1.52  --   --   --   HEPARINUNFRC  --   --   --  1.66*  --   --  0.88*  CREATININE 1.58*  --  1.57*  --   --  1.63*  --     Estimated Creatinine Clearance: 37.2 mL/min (by C-G formula based on SCr of 1.63 mg/dL (H)).   Medications:  Infusions:  . heparin 1,000 Units/hr (12/06/16 1604)    Assessment: 76 year old male with likely new cancer and acute PE. He was started on Lovenox on 3/6 and transitioned to Heparin on 3/8 for thoracentesis and biopsy that were performed 3.9. Heparin level remains supratherapeutic (0.88) on gtt at 1000 units/hr.   Note his hemoglobin and platelets have continued to drop over the past few days. The timing of his platelet drop isn't classically consistent with heparin induced thrombocytopenia but will need to be monitored closely. No bleeding noted.  Goal of Therapy:  Heparin level 0.3-0.7 units/ml Monitor platelets by anticoagulation protocol: Yes   Plan:  Decrease heparin infusion to 850 units/hr Check heparin level in 8 hours Daily heparin level and CBC - monitor platelets closely  Sherlon Handing, PharmD, BCPS Clinical pharmacist, pager  [phone] December 30, 2016, 12:26 AM

## 2016-12-29 NOTE — Progress Notes (Signed)
  Patient Name: Curtis Galloway   MRN: [id number]   Date of Birth/ Sex: 1940/10/05 , male      Admission Date: 12/21/2016  Attending Provider: Damita Lack, MD  Primary Diagnosis: Lung cancer ([place]) [C34.90] Healthcare-associated pneumonia [J18.9] Loculated pleural effusion [J90] Sepsis, due to unspecified organism [facility]) [A41.9]   Indication: Pt was in his usual state of health until this PM, when he was noted to be pulseless. Code blue was subsequently called. At the time of arrival on scene, ACLS protocol was underway.   Technical Description:  - CPR performance duration:  15 minutes  - Was defibrillation or cardioversion used? No   - Was external pacer placed? No  - Was patient intubated pre/post CPR? No   Medications Administered: Y = Yes; Blank = No Amiodarone    Atropine    Calcium    Epinephrine  Y  Lidocaine    Magnesium    Norepinephrine    Phenylephrine    Sodium bicarbonate  Y  Vasopressin    Other D50   Post CPR evaluation:  - Final Status - Was patient successfully resuscitated ? No   Miscellaneous Information:  - Time of death:  1:18 PM  - Primary team notified?  Yes  - Family Notified? Yes     Holley Raring, MD   12-09-16, 1:15 PM

## 2016-12-29 DEATH — deceased

## 2017-01-13 ENCOUNTER — Encounter: Payer: PPO | Admitting: Internal Medicine
